# Patient Record
Sex: Male | Born: 1955
Health system: Southern US, Community
[De-identification: ages and names within clinical notes are randomized; demographics above are authoritative.]

## PROBLEM LIST (undated history)

## (undated) DIAGNOSIS — N200 Calculus of kidney: Secondary | ICD-10-CM

## (undated) DIAGNOSIS — G4726 Circadian rhythm sleep disorder, shift work type: Secondary | ICD-10-CM

## (undated) DIAGNOSIS — I10 Essential (primary) hypertension: Secondary | ICD-10-CM

## (undated) DIAGNOSIS — T4145XA Adverse effect of unspecified anesthetic, initial encounter: Secondary | ICD-10-CM

## (undated) DIAGNOSIS — Z8669 Personal history of other diseases of the nervous system and sense organs: Secondary | ICD-10-CM

## (undated) DIAGNOSIS — Z9889 Other specified postprocedural states: Secondary | ICD-10-CM

## (undated) DIAGNOSIS — K219 Gastro-esophageal reflux disease without esophagitis: Secondary | ICD-10-CM

## (undated) DIAGNOSIS — G4730 Sleep apnea, unspecified: Secondary | ICD-10-CM

## (undated) DIAGNOSIS — T7840XA Allergy, unspecified, initial encounter: Secondary | ICD-10-CM

## (undated) DIAGNOSIS — C801 Malignant (primary) neoplasm, unspecified: Secondary | ICD-10-CM

## (undated) DIAGNOSIS — Z973 Presence of spectacles and contact lenses: Secondary | ICD-10-CM

## (undated) DIAGNOSIS — Z87442 Personal history of urinary calculi: Secondary | ICD-10-CM

## (undated) DIAGNOSIS — M1990 Unspecified osteoarthritis, unspecified site: Secondary | ICD-10-CM

## (undated) DIAGNOSIS — E049 Nontoxic goiter, unspecified: Secondary | ICD-10-CM

## (undated) DIAGNOSIS — G2581 Restless legs syndrome: Secondary | ICD-10-CM

## (undated) DIAGNOSIS — Z9989 Dependence on other enabling machines and devices: Secondary | ICD-10-CM

## (undated) DIAGNOSIS — I469 Cardiac arrest, cause unspecified: Secondary | ICD-10-CM

## (undated) DIAGNOSIS — G4733 Obstructive sleep apnea (adult) (pediatric): Secondary | ICD-10-CM

## (undated) DIAGNOSIS — N182 Chronic kidney disease, stage 2 (mild): Secondary | ICD-10-CM

## (undated) DIAGNOSIS — T8859XA Other complications of anesthesia, initial encounter: Secondary | ICD-10-CM

## (undated) DIAGNOSIS — R112 Nausea with vomiting, unspecified: Secondary | ICD-10-CM

## (undated) DIAGNOSIS — G4701 Insomnia due to medical condition: Principal | ICD-10-CM

## (undated) HISTORY — PX: TONSILLECTOMY: SUR1361

## (undated) HISTORY — DX: Circadian rhythm sleep disorder, shift work type: G47.26

## (undated) HISTORY — DX: Obstructive sleep apnea (adult) (pediatric): G47.33

## (undated) HISTORY — PX: CHOLECYSTECTOMY: SHX55

## (undated) HISTORY — DX: Calculus of kidney: N20.0

## (undated) HISTORY — DX: Dependence on other enabling machines and devices: Z99.89

## (undated) HISTORY — PX: APPENDECTOMY: SHX54

## (undated) HISTORY — DX: Sleep apnea, unspecified: G47.30

## (undated) HISTORY — DX: Insomnia due to medical condition: G47.01

## (undated) HISTORY — DX: Cardiac arrest, cause unspecified: I46.9

## (undated) HISTORY — DX: Malignant (primary) neoplasm, unspecified: C80.1

## (undated) HISTORY — PX: CYSTOSCOPY: SHX5120

## (undated) HISTORY — DX: Allergy, unspecified, initial encounter: T78.40XA

## (undated) HISTORY — PX: TYMPANOSTOMY TUBE PLACEMENT: SHX32

## (undated) HISTORY — PX: ROTATOR CUFF REPAIR: SHX139

## (undated) HISTORY — PX: UPPER GI ENDOSCOPY: SHX6162

## (undated) HISTORY — PX: OTHER SURGICAL HISTORY: SHX169

## (undated) HISTORY — DX: Restless legs syndrome: G25.81

## (undated) HISTORY — PX: VARICOCELE EXCISION: SUR582

---

## 1977-03-02 HISTORY — PX: VASECTOMY: SHX75

## 1998-03-15 ENCOUNTER — Ambulatory Visit: Admission: RE | Admit: 1998-03-15 | Discharge: 1998-03-15 | Payer: Self-pay | Admitting: Emergency Medicine

## 1998-03-16 ENCOUNTER — Ambulatory Visit: Admission: RE | Admit: 1998-03-16 | Discharge: 1998-03-16 | Payer: Self-pay | Admitting: Emergency Medicine

## 2000-02-29 ENCOUNTER — Encounter: Payer: Self-pay | Admitting: Emergency Medicine

## 2000-02-29 ENCOUNTER — Emergency Department (HOSPITAL_COMMUNITY): Admission: EM | Admit: 2000-02-29 | Discharge: 2000-02-29 | Payer: Self-pay | Admitting: Emergency Medicine

## 2000-10-12 ENCOUNTER — Ambulatory Visit (HOSPITAL_BASED_OUTPATIENT_CLINIC_OR_DEPARTMENT_OTHER): Admission: RE | Admit: 2000-10-12 | Discharge: 2000-10-12 | Payer: Self-pay | Admitting: Internal Medicine

## 2001-03-02 HISTORY — PX: OTHER SURGICAL HISTORY: SHX169

## 2001-03-08 ENCOUNTER — Encounter: Admission: RE | Admit: 2001-03-08 | Discharge: 2001-03-08 | Payer: Self-pay | Admitting: Orthopedic Surgery

## 2001-03-08 ENCOUNTER — Encounter: Payer: Self-pay | Admitting: Orthopedic Surgery

## 2002-01-16 ENCOUNTER — Encounter: Payer: Self-pay | Admitting: Orthopedic Surgery

## 2002-01-18 ENCOUNTER — Ambulatory Visit (HOSPITAL_COMMUNITY): Admission: RE | Admit: 2002-01-18 | Discharge: 2002-01-19 | Payer: Self-pay | Admitting: Orthopedic Surgery

## 2003-01-01 ENCOUNTER — Ambulatory Visit (HOSPITAL_COMMUNITY): Admission: RE | Admit: 2003-01-01 | Discharge: 2003-01-02 | Payer: Self-pay | Admitting: Orthopedic Surgery

## 2003-03-03 HISTORY — PX: CARDIAC CATHETERIZATION: SHX172

## 2004-01-04 ENCOUNTER — Ambulatory Visit: Payer: Self-pay | Admitting: Gastroenterology

## 2004-01-09 ENCOUNTER — Ambulatory Visit: Payer: Self-pay | Admitting: Gastroenterology

## 2004-02-08 ENCOUNTER — Ambulatory Visit: Payer: Self-pay | Admitting: Gastroenterology

## 2004-03-07 ENCOUNTER — Observation Stay (HOSPITAL_COMMUNITY): Admission: EM | Admit: 2004-03-07 | Discharge: 2004-03-08 | Payer: Self-pay | Admitting: *Deleted

## 2004-07-01 ENCOUNTER — Ambulatory Visit (HOSPITAL_COMMUNITY): Admission: RE | Admit: 2004-07-01 | Discharge: 2004-07-02 | Payer: Self-pay | Admitting: Orthopedic Surgery

## 2006-03-02 DIAGNOSIS — C439 Malignant melanoma of skin, unspecified: Secondary | ICD-10-CM

## 2006-03-02 HISTORY — PX: OTHER SURGICAL HISTORY: SHX169

## 2006-03-02 HISTORY — DX: Malignant melanoma of skin, unspecified: C43.9

## 2009-01-07 ENCOUNTER — Ambulatory Visit (HOSPITAL_BASED_OUTPATIENT_CLINIC_OR_DEPARTMENT_OTHER): Admission: RE | Admit: 2009-01-07 | Discharge: 2009-01-07 | Payer: Self-pay | Admitting: Orthopedic Surgery

## 2010-06-04 LAB — BASIC METABOLIC PANEL
BUN: 20 mg/dL (ref 6–23)
CO2: 28 mEq/L (ref 19–32)
Calcium: 8.9 mg/dL (ref 8.4–10.5)
Chloride: 105 mEq/L (ref 96–112)
Creatinine, Ser: 1.13 mg/dL (ref 0.4–1.5)
GFR calc Af Amer: >60 mL/min (ref >60)
GFR calc non Af Amer: >60 mL/min (ref >60)
Glucose, Bld: 85 mg/dL (ref 70–99)
Potassium: 3.6 mEq/L (ref 3.5–5.1)
Sodium: 140 mEq/L (ref 135–145)

## 2010-06-04 LAB — POCT HEMOGLOBIN-HEMACUE: Hemoglobin: 15.6 g/dL (ref 13.0–17.0)

## 2010-07-18 NOTE — Cardiovascular Report (Signed)
NAMEMYER, BOHLMAN NO.:  [id number]   MEDICAL RECORD NO.:  [id number]          PATIENT TYPE:  INP   LOCATION:  1826                         FACILITY:  [facility]   PHYSICIAN:  Nicki Guadalajara, M.D.     DATE OF BIRTH:  03/14/1955   DATE OF PROCEDURE:  03/07/2004  DATE OF DISCHARGE:                              CARDIAC CATHETERIZATION   CARDIAC CATHETERIZATION:   INDICATIONS:  Mr. 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  He has a strong family history for coronary  artery disease, with his mother having had CABG vascularization surgery.  The patient remotely had undergone treadmill testing approximately 3 years  ago, which was normal.   Over the past 5 days, the patient has developed recurrent episodes of chest  pain.  Initially, this was described somewhat sharp, would not evolve into a  substernal chest pressure.  Over the past 5 days, he has had increasing  episodes of chest pain.  He was awakened from sleep early this morning with  chest pain.  He was having intermittent episodes of discomfort today, which  lead to his evaluation at urgent care.  He was seen by Dr. Andee Poles and  transferred to [facility].   In the emergency room, ECG did not show any acute change or any significant  ST-T changes. However, due to the persistence of the mild chest discomfort,  definitive catheterization was recommended.   DESCRIPTION OF PROCEDURE:  After premedication with Versed 2 mg  intravenously, the patient was prepped and draped in the usual sterile  fashion.  This right femoral artery was punctured anteriorly and a 5 French  sheath was inserted without difficulty. Diagnostic catheterization was done  with 5 French Judkins 4 left and right coronary catheters.  A pigtail  catheter was used for biplane cineangiography. Distal aortography was also  performed. Hemostasis was obtained by direct manual pressure.  The patient  tolerated the procedure well.   HEMODYNAMIC DATA:  Initial central pressure was 146/93 with a mean of 118.  On LV, AO pull-back, left ventricular pressure was 133/13, post-A wave 19,  and central aortic pressure was 133/86, mean 109.   ANGIOGRAPHIC DATA:  Left main coronary artery was angiographically normal,  bifurcating to an LAD and left circumflex system.   The LAD was angiographically normal and gave rise to a small first diagonal  vessel, a large second diagonal vessel which was bifurcating.  The LAD  extended and wrapped around the LV apex.  The LAD in these pictures were  normal.   The circumflex vessel was angiographically normal and gave rise to 2  marginal vessels.   The right coronary artery was an angiographically normal, dominant vessel  that gave rise to the PDA and posterolateral system.   RAO ventriculography revealed normal systolic function without focal  segmental wall motion abnormalities.   DISTAL AORTOGRAPHY:  This revealed normal aortoiliac system.  There was no  evidence for renal artery stenosis.   IMPRESSION:  1.  Normal left ventricular function.  2.  Normal  coronary arteries.  3.  Normal aortoiliac system.       TK/MEDQ  D:  03/07/2004  T:  03/07/2004  Job:  [id number]   cc:   Naomie Dean, M.D.   Vania Rea. Jarold Motto, M.D. [facility]

## 2010-07-18 NOTE — Op Note (Signed)
NAMEMarland Kitchen  Casey [name], Casey [name]                          ACCOUNT NO.:  [id number]   MEDICAL RECORD NO.:  [id number]                   PATIENT TYPE:  OIB   LOCATION:  2550                                 FACILITY:  [facility]   PHYSICIAN:  Elana Alm. Thurston Hole, M.D.              DATE OF BIRTH:  02/12/1956   DATE OF PROCEDURE:  01/18/2002  DATE OF DISCHARGE:                                 OPERATIVE REPORT   PREOPERATIVE DIAGNOSES:  1. Left shoulder rotator cuff tear with labrum tear.  2. Left shoulder impingement.  3. Left shoulder AC joint spurring and arthrosis.   POSTOPERATIVE DIAGNOSES:  1. Left shoulder partial rotator cuff tear.  2. Left shoulder partial glenoid labrum tear.  3. Left shoulder impingement.  4. Left shoulder acromioclavicular joint spurring with arthrosis.   PROCEDURE:  1. Left shoulder examination under anesthesia followed by arthroscopic     partial rotator cuff tear debridement.  2. Left shoulder partial labrum tear debridement.  3. Left shoulder subacromial decompression.  4. Left shoulder distal clavicle excision.   SURGEON:  Elana Alm. Thurston Hole, M.D.   ASSISTANT:  Marvene Staff, P.A.   ANESTHESIA:  General.   OPERATIVE TIME:  45 minutes.   COMPLICATIONS:  None.   INDICATIONS FOR PROCEDURE:  Casey [name] is a 55 year old gentleman who injured  his left shoulder at work approximately 6 weeks ago, when he was climbing  onto a firetruck, holding a bar, and his foot slipped causing him to fall,  injuring his left arm; persistent pain. X-rays and MRI have revealed a  possible rotator cuff tear with impingement, he has failed conservative care  and he is now to undergo arthroscopy.   DESCRIPTION OF PROCEDURE:  The patient was brought to the operating room on  01/18/02 after a block had been placed in the holding area.  The patient was  placed on the operating table in the supine position.  After an adequate  level of general anesthesia was obtained, his left shoulder was  examined  under anesthesia.  He had full range of motin and shoulder was stable to  ligamentous exam.  He was then placed in a beach chair position, and the  shoulder and arm was prepped using sterile Duraprep and drape using sterile  technique.   Originally through a posterior arthroscopic protal and the arthroscope with  a pump attached, this was placed into an anterior portal, and arthroscopic  probe was placed. On initial inspection, the articular cartilage and  glenohumeral joint was intact.  Anterior labrum showed partial tearing, 25-  30% which was debrided. Superior labrum and biceps tendon anchor was intact.  Biceps tendon was intact. Inferior labrum and anteroinferior glenohumeral  ligament complex was intact and the posterior labrum showed partial tearing,  25% which was debrided. Rotator cuff showed a partial tear of 40-50% of the  undersurface of the supraspinatus which was debrided, but a complete  tear  was not found. The rest of the rotator cuff was intact.  The inferior  capsular recess was free of pathology.   Subacromial space was entered and a lateral arthroscopic portal was made. A  large amount of bursitis was resected.  Underneath this, the rotator cuff  was inflamed and somewhat frayed, but no evidence of a tear.  Subacromial  decompression was carried out, removing 6-8 mm of the undersurface of the  anterior, anterolateral and anteromedial acromion and CA ligament release  carried out as well. The [facility] joint was exposed.  There was significant  spurring and degenerative changes in the distal clavicle and the distal 5-6  mm of the clavicle was resected.  After this was done, the shoulder could be  brought through a full range of motin with no impingement of the rotator  cuff.  At this point, it was felt that all pathology had been satisfactorily  addressed. The instruments were removed. The portals were closed with 3-0  nylon suture and sterile dressings and a sling  applied. The patient was  awakened, taken to the recovery room in stable condition.  Needle and sponge  counts correct x2 at the end of the care.   FOLLOW UP CARE:  The patient will be followed as an in-patient on telemetry  due to a history of sleep apnea.  He will be discharged tomorrow if stable.  Begin early physical therapy with passive range of motin only x4-5 weeks and  active thereafter. We will see him back in a week for sutures out and follow  up.                                                   [name], M.D.    RAW/MEDQ  D:  01/18/2002  T:  01/18/2002  Job:  [id number]   cc:   Workmans' Compensation carrier

## 2010-07-18 NOTE — Op Note (Signed)
Casey [name], MATHIESON NO.:  [id number]   MEDICAL RECORD NO.:  [id number]          PATIENT TYPE:  OIB   LOCATION:  5024                         FACILITY:  [facility]   PHYSICIAN:  Dionne Ano. Gramig III, M.D.DATE OF BIRTH:  12-Aug-1955   DATE OF PROCEDURE:  07/01/2004  DATE OF DISCHARGE:                                 OPERATIVE REPORT   PREOPERATIVE DIAGNOSES:  Left wrist injury with TFC tear and notable LT  ligament tear, rule out unstable LT joint.   POSTOPERATIVE DIAGNOSES:  Stable lunotriquetral/LT joint and noted TFC  central tear large in nature.   SURGICAL PROCEDURE PERFORMED:  1.  Evaluation under anesthesia, left wrist.  2.  Arthroscopic evaluation of the left wrist about the radial carpal and      mid-carpal joints with left TFC debridement of a central large tear and      lunotriquetral membranous tear debridement, which was non-destabilized.  3.  Synovectomy of radial carpal joint.   SURGEON:  Dionne Ano. Amanda Pea, MD.   ASSISTANT:  Karie Chimera, PA-C.   COMPLICATIONS:  None.   ANESTHESIA:  General.   ESTIMATED BLOOD LOSS:  Minimal.   INDICATIONS FOR PROCEDURE:  This patient is a very pleasant male who  presents with the above admission diagnoses.  I have counseled him with  regard to the risks and benefits of surgery.  With this in mind, I was asked  to proceed.  All questions have been encouraged and answered preoperatively.   OPERATIVE FINDINGS:  This patient had an unstable central TFC tear.  This  underwent debridement.  The periphery was intact and stable.  This patient  also underwent debridement of a membranous lunotriquetral tear, which was  non-destabilizing.  The LT joint, as we evaluated about the mid-carpal and  radial carpal joints, was stable and did not require pinning.   OPERATION IN DETAIL:  The patient was seen by myself and the Anesthesia team  in the operative suite and underwent smooth induction of general anesthesia.  Once  under anesthesia, he was _____ and prepped and draped in the usual  sterile fashion.  Following this, examination under anesthesia revealed a  stable wrist joint.  I then made a 3.4 portal.  The 3.4 portal was created  without difficulty.  A 6-U outflow portal on a 6-R working portal as well as  a 4.5 working portal was created.  Following this, the arthroscope was  introduced and sequential evaluation was accomplished.  The radial styloid,  scaphoid fossa, and lunate fossa were intact.  The scapholunate interosseous  ligament as well as scaphoid and lunate were intact.  The lunotriquetral  ligament had a membranous tear, but no gross destabilization on the radial  carpal region.  The patient had a large TFC tear, which was highly irregular  and in poor condition centrally.  The periphery was intact and was probed  thoroughly.  I then with combination shaver and thermal ablator performed  debridement of the central TFC tear without difficulty.  This was done to my  satisfaction and smoothed to a nice firm  base.  The patient had the  periphery kept intact and without iatrogenic injury. The patient had a  synovectomy performed as well.  The synovectomy was accomplished with a  shaver without complicating features.  Once the synovectomy was performed, I  then placed the scope in the 4.5 portal and through the 6-R portal and  debrided the LT membranous tear.  I did not see any gross instability at  this time.  Once this was done, I made a mid-carpal portal.  This was a mid-  carpal R-portal.  The arthroscope was introduced, and I stretched the head  of scapholunate and lunotriquetral joints under direct arthroscopic  evaluation and did not see a highly unstable LT joint.  Thus, the decision  was made not to pin the LT joint.  I feel this will heal.  The patient does  not have instability about the lunotriquetral joint that is excessive.  Once  this was done, I then performed a second look at the  [facility], all looked quite  well, and I was pleased with the debridement and synovectomy.  The  arthroscope was then removed.  The wrist was stable.  I then performed a  closure of the portals and placement of Marcaine without epinephrine.  Following this, the patient had a long-arm plaster splint applied without  difficulty.   He tolerated the procedure well, and there were no complicating features.  He will be admitted overnight due to the fact that he has sleep apnea and we  will want to watch him closely.  We have gone over these issues in detail  and all questions have been encouraged and answered.      WMG/MEDQ  D:  07/01/2004  T:  07/01/2004  Job:  [id number]

## 2010-07-18 NOTE — Discharge Summary (Signed)
Casey [name], Casey [name] NO.:  [id number]   MEDICAL RECORD NO.:  [id number]          PATIENT TYPE:  INP   LOCATION:  3704                         FACILITY:  [facility]   PHYSICIAN:  Nicki Guadalajara, M.D.     DATE OF BIRTH:  06/24/1955   DATE OF ADMISSION:  03/07/2004  DATE OF DISCHARGE:  03/08/2004                                 DISCHARGE SUMMARY   [name] is a 55 year old white married male, with a no history of CAD. He  came to the emergency room, after being seen at Urgent Care by Dr. Pollie Friar. He  was referred secondary to chest pain. 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 He was seen in the emergency room  by Dr. Jearld Pies and it was decided that he should cardiac catheterization.   He was taken to the laboratory. He had normal coronary arteries. The morning  of 1/706, he was feeling fine, his blood was okay, his laboratories were all  normal, his blood pressure 132/80, heart rate 85, temperature 97.5. His  laboratories were okay and it was decided to put him on Protonix for a month  and then he could use it on a p.r.n. basis.   DISPOSITION:  He was considered stable to be discharged to home.   DISCHARGE FOLLOW-UP:  He can follow up with primary care doctor.   DISCHARGE SPECIAL INSTRUCTIONS:  If he has any problems with his groin, he  will give our office a call.   DISCHARGE MEDICATIONS:  His usual medications. He takes trazodone 400 mg at  bedtime, Flexeril 40 mg at bedtime, Klonopin 4 mg at bedtime, Protonix 40  mg, one tablet per day x1 month and then p.r.n.   LABORATORY DATA:  His hemoglobin was 14.6, hematocrit 43.1, WBC 8.5,  platelets 261. Sodium 138, potassium 3.8, chloride 108, CO2 26, BUN 19,  creatinine 1.1. CK-MB was negative. Lipid profile showed a total cholesterol  of 236, triglycerides 111, HDL 36, LDL 178; this was a nonfasting specimen.  Diet therapy was recommended for now and he should follow up with his  primary care doctor. It may be that he will need Statin therapy for primary  prevention in the future.   DISCHARGE DIAGNOSIS:  1.  Chest pain, noncoronary ischemia with a normal cardiac catheterization,      normal left ventricular function.  2.  Possible musculoskeletal pain or gastritis, treated with Protonix x1      month.  3.  Hyperlipidemia, diet recommended at this time and follow up with his      primary care doctor.       BB/MEDQ  D:  03/08/2004  T:  03/08/2004  Job:  [id number]   cc:   Dr. Pollie Friar  Urgent Care

## 2010-07-18 NOTE — Op Note (Signed)
NAMEMarland [name]  COY, ROCHFORD                          ACCOUNT NO.:  [id number]   MEDICAL RECORD NO.:  [id number]                   PATIENT TYPE:  OIB   LOCATION:  2550                                 FACILITY:  [facility]   PHYSICIAN:  Elana Alm. Thurston Hole, M.D.              DATE OF BIRTH:  12/17/1955   DATE OF PROCEDURE:  01/01/2003  DATE OF DISCHARGE:                                 OPERATIVE REPORT   PREOPERATIVE DIAGNOSIS:  Left shoulder partial rotator cuff tear with  arthrofibrosis and impingement.   POSTOPERATIVE DIAGNOSIS:  Left shoulder partial rotator cuff tear with  arthrofibrosis and impingement.   OPERATION PERFORMED:  1. Left shoulder examination under anesthesia followed by arthroscopic     partial rotator cuff tear.  2. Lysis of adhesions with manipulation.  3. Left shoulder subacromial decompression with acromioclavicular joint     debridement.   SURGEON:  Elana Alm. Thurston Hole, M.D.   ASSISTANT:  Julien Girt, P.A.   ANESTHESIA:  General.   OPERATIVE TIME:  45 minutes.   COMPLICATIONS:  None.   INDICATIONS FOR PROCEDURE:  Casey [name] is a 55 year old gentleman who  initially injured his left shoulder a year ago.  Subsequently, he underwent  left shoulder arthroscopy with partial rotator cuff tear debridement and  subacromial decompression, distal clavicle excision 11 months ago.  He has  had persistent pain despite this surgery with lack of response to  postoperative routine care.  MRI done subsequently has revealed no definite  recurrent rotator cuff tear but with Casey [name] persistent pain and lack of  response to conservative care, we have recommended lysis of adhesions,  manipulation and repeat arthroscopy which he is in agreement with and will  proceed with this today.   DESCRIPTION OF PROCEDURE:  Casey [name] was brought to the operating room on  January 01, 2003 after a block had been placed in the holding room.  He was  placed on the operating table in supine  position.  After being placed under  general anesthesia, his left shoulder was examined under anesthesia.  He  actually had full range of motion of the shoulder and it was stable to  ligamentous exam.  After this was done, he was placed in a beach chair  position and his shoulder and arm was prepped using sterile DuraPrep and  draped using sterile technique.  Initially, the arthroscopy was performed  through a posterior arthroscopic portal.  The arthroscope with a pump  attached was placed and through an anterior portal, an arthroscopic probe  was placed.  On initial inspection, the articular cartilage in the  glenohumeral joint was intact.  The anterior labrum, superior labrum was  intact, the biceps tendon was intact.  The biceps tendon anchor was intact,  posterior labrum was intact.  Anterior inferior glenohumeral ligament  complex was intact.  Rotator cuff was thoroughly inspected and the area of  its previous  partial tear had healed.  There was some mild fraying of the  infraspinatus which was debrided but the remaining portions of the rotator  cuff were found to be intact.  Inferior capsular recess was free of  pathology. Subacromial space was entered and a lateral arthroscopic portal  was made.  There was thickened bursitis and partial tearing of the bursal  surface of the rotator cuff which was debrided and a subtotal bursectomy was  carried out as well. There was found to be calcifications on the anterior  acromion where he had had a previous subacromial decompression and these  were totally resected with a 6 mm bur.  The acromioclavicular joint area  where he had a previous distal clavicle excision was inspected.  There was  found to be fibrotic tissue in this area and this was thoroughly debrided  but the acromioclavicular joint area was completely wide open with  approximately 1 cm of open space with no need for a second disk revision,  distal clavicle excision.  After all of  this lysis of adhesions, bursectomy,  decompression and partial cuff tear had been debrided, no further pathology  was noted.  The arthroscopic instruments were removed.  The portals were  closed with 3-0 nylon suture and sterile dressings and a sling applied and  the patient awakened and taken to recovery room in stable condition.   FOLLOW UP:  Mr. Bendickson will be followed as an overnight patient for  observation.  He will be discharged in 24 hours.  He will begin early  physical therapy.  We will see him back in the office in a week for suture  removal and follow-up.                                               [name], M.D.    RAW/MEDQ  D:  01/01/2003  T:  01/01/2003  Job:  [id number]

## 2010-07-27 ENCOUNTER — Observation Stay (HOSPITAL_COMMUNITY)
Admission: EM | Admit: 2010-07-27 | Discharge: 2010-07-28 | Disposition: A | Discharge status: Home / Self Care | Payer: 59 | Attending: Urology | Admitting: Urology

## 2010-07-27 ENCOUNTER — Emergency Department (HOSPITAL_COMMUNITY): Payer: 59

## 2010-07-27 DIAGNOSIS — N201 Calculus of ureter: Principal | ICD-10-CM | POA: Insufficient documentation

## 2010-07-27 DIAGNOSIS — Z79899 Other long term (current) drug therapy: Secondary | ICD-10-CM | POA: Insufficient documentation

## 2010-07-27 DIAGNOSIS — G4733 Obstructive sleep apnea (adult) (pediatric): Secondary | ICD-10-CM | POA: Insufficient documentation

## 2010-07-27 DIAGNOSIS — R109 Unspecified abdominal pain: Secondary | ICD-10-CM | POA: Insufficient documentation

## 2010-07-27 DIAGNOSIS — I10 Essential (primary) hypertension: Secondary | ICD-10-CM | POA: Insufficient documentation

## 2010-07-27 DIAGNOSIS — M1990 Unspecified osteoarthritis, unspecified site: Secondary | ICD-10-CM | POA: Insufficient documentation

## 2010-07-27 DIAGNOSIS — K219 Gastro-esophageal reflux disease without esophagitis: Secondary | ICD-10-CM | POA: Insufficient documentation

## 2010-07-27 LAB — DIFFERENTIAL
Basophils Absolute: 0.1 10*3/uL (ref 0.0–0.1)
Eosinophils Absolute: 0.3 10*3/uL (ref 0.0–0.7)
Lymphocytes Relative: 32 % (ref 12–46)
Lymphs Abs: 2.7 10*3/uL (ref 0.7–4.0)
Neutrophils Relative %: 55 % (ref 43–77)

## 2010-07-27 LAB — URINALYSIS, ROUTINE W REFLEX MICROSCOPIC
Bilirubin Urine: NEGATIVE
Glucose, UA: NEGATIVE mg/dL
Ketones, ur: NEGATIVE mg/dL
Leukocytes, UA: NEGATIVE
Specific Gravity, Urine: 1.019 (ref 1.005–1.030)
pH: 5.5 (ref 5.0–8.0)

## 2010-07-27 LAB — BASIC METABOLIC PANEL
BUN: 18 mg/dL (ref 6–23)
Chloride: 103 mEq/L (ref 96–112)
GFR calc non Af Amer: >60 mL/min (ref >60)
Potassium: 3.8 mEq/L (ref 3.5–5.1)
Sodium: 139 mEq/L (ref 135–145)

## 2010-07-27 LAB — CBC
MCV: 88.9 fL (ref 78.0–100.0)
Platelets: 239 10*3/uL (ref 150–400)
RBC: 4.79 MIL/uL (ref 4.22–5.81)
RDW: 12.4 % (ref 11.5–15.5)
WBC: 8.5 10*3/uL (ref 4.0–10.5)

## 2010-07-27 LAB — URINE MICROSCOPIC-ADD ON

## 2010-07-28 LAB — URINE CULTURE
Colony Count: NO GROWTH
Culture  Setup Time: 201205280020
Culture: NO GROWTH
Special Requests: POSITIVE

## 2010-07-28 NOTE — Op Note (Signed)
  [name], Casey                ACCOUNT NO.:  [id number]  MEDICAL RECORD NO.:  [id number]           PATIENT TYPE:  I  LOCATION:  1443                         FACILITY:  [facility]  PHYSICIAN:  Martina Sinner, MD DATE OF BIRTH:  23-Jun-1955  DATE OF PROCEDURE:  07/27/2010 DATE OF DISCHARGE:                              OPERATIVE REPORT   PREOPERATIVE DIAGNOSIS:  Left ureteral stone with left flank pain.  POSTOPERATIVE DIAGNOSIS:  Left ureteral stone with left flank pain.  SURGERY: 1. Cystoscopy. 2. Left retrograde urethrogram. 3. Insertion of left ureteral stent; fulguration of small mucosal     bleeder.  Mr. Casey [name] is followed by Dr. Brunilda Payor.  He has passed approximately 20 stones over his lifetime.  By history, he did not tolerate the stent many years ago.  The patient had ongoing colic and consented to the above procedure.  He is known to have 2 upper ureteral stone, largest 6 mm.  A recent KUB today was not definitive on the diagnosis and he had a CT scan on Jul 11, 2010.  The patient was prepped and draped in usual fashion.  A 21-French scope was utilized.  Penile bulbar membranous urethra normal.  He had mild bilobar enlargement of the prostate.  He has little bit of a high-riding bladder neck.  When I cystoscoped the patient, he had approximately 6 mm stone that was just starting to crown at the left ureteral orifice.  I gently tried to remove the stone with grasping forceps, but it would not come through the meatus.  At this point, I decided to place a sensor wire.  On passage of the sensor wire easily up in the left renal pelvis, the stone migrated proximally.  Over the sensor wire, I passed a 6-French open-end ureteral catheter to the mid ureter.  Retrograde urethrogram:  I injected approximately 4 to 5 cc gently of contrast and he had a nondilated left ureter and left collecting system. I did not see any stone filling defects.  I then replaced the  sensor wire curling in the upper pole calix.  Over the wire and under fluoroscopic and cystoscopic guidance, I passed a 26- cm x 6-mm double-J stent curling in the bladder and the upper pole calix.  There was some dark colored urine expelling from the ureteral orifice at the middle wall of the mouth.  There was a little bit of sand as well.  It was not sent for stone analysis.  From trying to grasp the stone, there was one small mucosal bleeder at 5 o'clock away from the ureteral orifice.  This was easily fulgurated with a light energy.  There was no bleeding at the end of case.  Bladder was emptied at the end of the case.  The patient will be taken to recovery room.          ______________________________ Martina Sinner, MD     SAM/MEDQ  D:  07/27/2010  T:  07/28/2010  Job:  [id number]  Electronically Signed by Alfredo Martinez MD on 07/28/2010 06:15:17 PM

## 2010-08-04 ENCOUNTER — Observation Stay (HOSPITAL_COMMUNITY)
Admission: RE | Admit: 2010-08-04 | Discharge: 2010-08-05 | Disposition: A | Discharge status: Home / Self Care | Payer: 59 | Source: Ambulatory Visit | Attending: Urology | Admitting: Urology

## 2010-08-04 DIAGNOSIS — Z86718 Personal history of other venous thrombosis and embolism: Secondary | ICD-10-CM | POA: Insufficient documentation

## 2010-08-04 DIAGNOSIS — N201 Calculus of ureter: Principal | ICD-10-CM | POA: Insufficient documentation

## 2010-08-04 DIAGNOSIS — I10 Essential (primary) hypertension: Secondary | ICD-10-CM | POA: Insufficient documentation

## 2010-08-04 DIAGNOSIS — Z7982 Long term (current) use of aspirin: Secondary | ICD-10-CM | POA: Insufficient documentation

## 2010-08-04 DIAGNOSIS — Z79899 Other long term (current) drug therapy: Secondary | ICD-10-CM | POA: Insufficient documentation

## 2010-08-04 DIAGNOSIS — G4730 Sleep apnea, unspecified: Secondary | ICD-10-CM | POA: Insufficient documentation

## 2010-08-04 DIAGNOSIS — R12 Heartburn: Secondary | ICD-10-CM | POA: Insufficient documentation

## 2010-08-11 NOTE — Op Note (Signed)
NAMERANDELL, TEARE NO.:  [id number]  MEDICAL RECORD NO.:  [id number]  LOCATION:  1534                         FACILITY:  [facility]  PHYSICIAN:  Excell Seltzer. Annabell Howells, M.D.    DATE OF BIRTH:  January 01, 1956  DATE OF PROCEDURE:  08/04/2010 DATE OF DISCHARGE:                              OPERATIVE REPORT   PROCEDURE:  Cystoscopy, removal of left double-J stent and left ureteroscopic stone extraction.  PREOPERATIVE DIAGNOSIS:  Left distal stones.  POSTOPERATIVE DIAGNOSIS:  Left distal stones.  SURGEON:  Excell Seltzer. Annabell Howells, MD  ANESTHESIA:  General.  SPECIMEN:  Distal ureteral stones and stent which was discarded.  COMPLICATIONS:  None.  INDICATIONS:  Mr. Jim is a 55 year old white male who underwent stenting for left distal stones approximately 8 days ago by Dr. Sherron Monday.  He was unable to retrieve the stones and the patient returned today with severe stent discomfort and was elected to proceed with stent removal and ureteroscopy this evening.  FINDINGS OF THE PROCEDURE:  He was given Cipro.  He was taken operating room where general anesthetic was induced.  He was placed in lithotomy position.  His perineum and genitalia were prepped Betadine solution and he was draped in usual sterile fashion.  A time-out was performed. Cystoscopy was performed using the 22-French scope and 12-degree lens. Examination revealed a normal urethra.  The prostatic urethra was approximately 3 cm length with bilobar hyperplasia and a small middle lobe.  Examination of bladder revealed mild trabeculation.  No tumors or stones were noted.  The right ureteral orifice was unremarkable.  The left ureteral orifice was edematous, but widely patent with a stent exiting the meatus.  The stent was grasped with a grasping forceps and pulled to the urethral meatus.  The wire was passed to the kidney and the stent was removed and discarded.  A 6-French short ureteroscope was then inserted alongside  the wire.  The initial stone was identified immediately and was grasped with a Nitinol basket and removed without difficulty through the dilated ureter. Second inspection initially did not notice a stone and I advanced the scope all the way to the kidney, but on retrieval there was a small fragment, it was noted that seemed to flush away.  At this point, the ureteroscope was removed.  Cystoscope was reinserted and the bladder was inspected.  No stone fragments were found.  I elected to reinsert the ureteroscope to make one final look and actually found second stone larger than the first that measured approximately 6 mm. This stone was grasped with a Nitinol basket and also removed without difficulty through the dilated ureter.  At this point, a final inspection with ureteroscope was performed with no residual stones found, no significant ureteral trauma, so it was felt a stent was not needed for reinsertion.  At this point, the cystoscope was reinserted over the wire.  The wire was removed.  The bladder was drained.  The drapes were removed.  The patient was taken down from lithotomy position and his anesthetic was reversed.  He was moved to the recovery room in stable condition.  He was given Toradol at the end of the case.  Excell Seltzer. Annabell Howells, M.D.     JJW/MEDQ  D:  08/04/2010  T:  08/05/2010  Job:  [id number]  cc:   Martina Sinner, MD Fax: [phone]  Electronically Signed by Bjorn Pippin M.D. on 08/11/2010 07:41:59 AM

## 2010-08-31 NOTE — Discharge Summary (Signed)
  NAMEJERRAL, MCCAULEY NO.:  [id number]  MEDICAL RECORD NO.:  [id number]  LOCATION:  1534                         FACILITY:  [facility]  PHYSICIAN:  Excell Seltzer. Annabell Howells, M.D.    DATE OF BIRTH:  May 07, 1955  DATE OF ADMISSION:  08/04/2010 DATE OF DISCHARGE:  08/05/2010                              DISCHARGE SUMMARY   Briefly, Mr. Tippen is a 55 year old white male who has a left ureteral stone and underwent stent placement 8 days prior to admission.  He is not tolerating the stent well and so come in today for removal of the stent and stone.  Past history is pertinent for sleep apnea, heartburn, hypertension and history of DVTs  SURGICAL HISTORY:  Pertinent for foot surgery on both feet, left knee surgery, left shoulder surgery, left wrist surgery and the cysto as noted above.  ADMISSION MEDICATIONS:  Include Aciphex, aspirin, cefatrizine, ciprofloxacin, clonazepam, cyclobenzaprine, glucosamine, hydromorphone, lisinopril, multivitamin, omega 3, promethazine, Pyridium, tamsulosin, tramadol, trazodone, vitamin C and vitamin D  ALLERGIES:  Include VICODIN AND CODEINE DERIVATIVES.  For additional details of the history, see the office H and P.  Accessory clinical information, PCR was negative.  HOSPITAL COURSE:  On the day of admission he was taken to operating room where he underwent cystoscopy with removal of left ureteral stent and removal of two 6-mm left distal ureteral stent.  The stent was not reinserted.  Postoperatively he did well but since the procedure was done so late at night he was kept until the following morning at which point he was discharged home with resumption of his current medications and instructions to follow up in 1 to 2 weeks for followup visit.  He is to bring the stone to the office.  DISCHARGE DIAGNOSIS:  Left distal ureteral stone and severe stent pain. There were no complications during admission.     Excell Seltzer. Annabell Howells,  M.D.     JJW/MEDQ  D:  08/11/2010  T:  08/11/2010  Job:  [id number]  Electronically Signed by Bjorn Pippin M.D. on 08/31/2010 12:08:10 PM

## 2010-09-02 NOTE — Discharge Summary (Signed)
  NAMEKEYLAN, Casey [name] NO.:  [id number]  MEDICAL RECORD NO.:  [id number]  LOCATION:  1443                         FACILITY:  [facility]  PHYSICIAN:  Martina Sinner, MD DATE OF BIRTH:  [birth year]  DATE OF ADMISSION:  07/27/2010 DATE OF DISCHARGE:  07/28/2010                              DISCHARGE SUMMARY   PREOPERATIVE DIAGNOSIS:  Left ureteral stones.  POSTOPERATIVE DIAGNOSIS:  Left ureteral stones.  SURGERY:  Cystoscopy, left retrograde urethrogram, insertion of left ureteral stent.  Mr. [name] had left flank pain.  He consented to the above procedure.  He underwent a stent.  He did well postoperatively.  He had no stent pain postoperatively.  He was little bit constipated.  He was discharged home with normal vitals, normal labs, good mobility and having no pain.  Mr. Casey [name] with follow up with Dr. Ezzie Dural.          ______________________________ Martina Sinner, MD     SAM/MEDQ  D:  08/19/2010  T:  08/19/2010  Job:  [id number]  Electronically Signed by Alfredo Martinez MD on 09/02/2010 12:28:27 PM

## 2011-04-04 ENCOUNTER — Other Ambulatory Visit: Payer: Self-pay | Admitting: Emergency Medicine

## 2011-04-05 ENCOUNTER — Other Ambulatory Visit: Payer: Self-pay | Admitting: Emergency Medicine

## 2011-04-07 ENCOUNTER — Other Ambulatory Visit: Payer: Self-pay | Admitting: Emergency Medicine

## 2011-04-20 ENCOUNTER — Other Ambulatory Visit: Payer: Self-pay | Admitting: Family Medicine

## 2011-04-20 MED ORDER — CYCLOBENZAPRINE HCL 10 MG PO TABS: ORAL_TABLET | ORAL | Status: DC

## 2011-04-20 MED ORDER — TRAZODONE HCL 100 MG PO TABS: 100.0000 mg | ORAL_TABLET | Freq: Every day | ORAL | Status: DC

## 2011-04-26 ENCOUNTER — Ambulatory Visit (INDEPENDENT_AMBULATORY_CARE_PROVIDER_SITE_OTHER): Payer: 59 | Admitting: Emergency Medicine

## 2011-04-26 ENCOUNTER — Telehealth: Payer: Self-pay | Admitting: Family Medicine

## 2011-04-26 DIAGNOSIS — G4700 Insomnia, unspecified: Secondary | ICD-10-CM

## 2011-04-26 DIAGNOSIS — E669 Obesity, unspecified: Secondary | ICD-10-CM

## 2011-04-26 DIAGNOSIS — R1084 Generalized abdominal pain: Secondary | ICD-10-CM

## 2011-04-26 DIAGNOSIS — M549 Dorsalgia, unspecified: Secondary | ICD-10-CM

## 2011-04-26 DIAGNOSIS — F5104 Psychophysiologic insomnia: Secondary | ICD-10-CM | POA: Insufficient documentation

## 2011-04-26 DIAGNOSIS — M159 Polyosteoarthritis, unspecified: Secondary | ICD-10-CM | POA: Insufficient documentation

## 2011-04-26 DIAGNOSIS — F411 Generalized anxiety disorder: Secondary | ICD-10-CM

## 2011-04-26 MED ORDER — TRAZODONE HCL 100 MG PO TABS: 100.0000 mg | ORAL_TABLET | Freq: Two times a day (BID) | ORAL | Status: DC

## 2011-04-26 MED ORDER — RABEPRAZOLE SODIUM 20 MG PO TBEC: 20.0000 mg | DELAYED_RELEASE_TABLET | Freq: Every day | ORAL | Status: DC

## 2011-04-26 MED ORDER — CYCLOBENZAPRINE HCL 10 MG PO TABS: ORAL_TABLET | ORAL | Status: DC

## 2011-04-26 MED ORDER — TRAMADOL HCL 50 MG PO TABS
50.0000 mg | ORAL_TABLET | Freq: Two times a day (BID) | ORAL | Status: DC
Start: 2011-04-26 — End: 2011-12-30

## 2011-04-26 MED ORDER — CLONAZEPAM 2 MG PO TABS: 2.0000 mg | ORAL_TABLET | Freq: Two times a day (BID) | ORAL | Status: DC | PRN

## 2011-04-26 NOTE — Telephone Encounter (Signed)
Called in rx for clonazapam.

## 2011-04-26 NOTE — Progress Notes (Signed)
  Subjective:    Patient ID: Casey Bondar., male    DOB: Mar 22, 1955, 56 y.o.   MRN: [id number]  HPI patient comes in for prescription refills. 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    Review of Systems no change in the status of his chronic back pain and difficulty with insomnia.     Objective:   Physical Exam HEENT exam is within normal limits neck is supple chest clear.       Assessment & Plan:  Assessment as chronic low back pain second problem is chronic insomnia.

## 2011-05-19 ENCOUNTER — Other Ambulatory Visit: Payer: Self-pay | Admitting: Emergency Medicine

## 2011-05-23 ENCOUNTER — Telehealth: Payer: Self-pay

## 2011-05-23 NOTE — Telephone Encounter (Signed)
Pt states that he was taken off of Lisinopril by Dr.Daub but know he would like to begin taking the lisinopril again if possible. Pt would like a three month supply called into [facility]

## 2011-05-24 MED ORDER — LISINOPRIL-HYDROCHLOROTHIAZIDE 10-12.5 MG PO TABS: 1.0000 | ORAL_TABLET | Freq: Every day | ORAL | Status: DC

## 2011-05-24 NOTE — Telephone Encounter (Signed)
I put him on Lisinopril HCTZ 10/12.5 but I only gave him 30d supply because we should check him again to make sure it is working and his symptoms have resolved.

## 2011-05-24 NOTE — Telephone Encounter (Signed)
Why does pt want to go back on meds?  What is his BP running?

## 2011-05-24 NOTE — Telephone Encounter (Signed)
Pt.notified

## 2011-05-24 NOTE — Telephone Encounter (Signed)
Pt stated that he started having headaches so he checked his BP and it has been running top number between 140-145 and bottom between 90-92.  He wants 90 days for 1 yr if possible.

## 2011-10-04 ENCOUNTER — Other Ambulatory Visit: Payer: Self-pay | Admitting: Physician Assistant

## 2011-10-04 ENCOUNTER — Other Ambulatory Visit: Payer: Self-pay | Admitting: Internal Medicine

## 2011-10-05 ENCOUNTER — Other Ambulatory Visit: Payer: Self-pay | Admitting: *Deleted

## 2011-12-10 ENCOUNTER — Telehealth: Payer: Self-pay

## 2011-12-10 NOTE — Telephone Encounter (Signed)
pts out patient pharmacy calling to get clarification on flexeril  Rx,directions say 4 at bedtime????   Best phone [phone]

## 2011-12-10 NOTE — Telephone Encounter (Signed)
I have spoken to patient and he does take 4 at bedtime. Please advise, patient states he has taken 4 at bedtime for many years.

## 2011-12-10 NOTE — Telephone Encounter (Signed)
Please advise on sig for Flexeril

## 2011-12-11 NOTE — Telephone Encounter (Signed)
Called pharmacy to advise this is how he takes the medication.

## 2011-12-11 NOTE — Telephone Encounter (Signed)
Yes, it looks like that is correct. He takes 4 tablets at bedtime.

## 2011-12-19 ENCOUNTER — Other Ambulatory Visit: Payer: Self-pay | Admitting: Physician Assistant

## 2011-12-22 ENCOUNTER — Other Ambulatory Visit: Payer: Self-pay | Admitting: Physician Assistant

## 2011-12-28 ENCOUNTER — Encounter: Payer: Self-pay | Admitting: Radiology

## 2011-12-29 ENCOUNTER — Telehealth: Payer: Self-pay

## 2011-12-29 MED ORDER — CLONAZEPAM 2 MG PO TABS: 4.0000 mg | ORAL_TABLET | Freq: Every evening | ORAL | Status: DC | PRN

## 2011-12-29 NOTE — Telephone Encounter (Signed)
Patient would like refill of KLONOPIN.  Best#[phone]  [id number] - [place], [place] - [address]

## 2011-12-29 NOTE — Telephone Encounter (Signed)
Notified pt that Rx was faxed to pharmacy w/confirmation but that it is only for 14 days since he is overdue for OV. Pt agreed and stated he will actually try to come in tomorrow morning to see Dr Cleta Alberts.

## 2011-12-29 NOTE — Telephone Encounter (Signed)
Advise on renewal, med not pended, last Rx states needs OV, ? Now lower quantity vs denial. Pleas advise. Last fvisit was Feb.

## 2011-12-29 NOTE — Telephone Encounter (Signed)
Prescription for #30 pills signed at desk, pt needs OV for more

## 2011-12-30 ENCOUNTER — Ambulatory Visit (INDEPENDENT_AMBULATORY_CARE_PROVIDER_SITE_OTHER): Payer: 59 | Admitting: Emergency Medicine

## 2011-12-30 VITALS — BP 115/78 | HR 82 | Temp 98.0°F | Resp 16 | Ht 66.0 in | Wt 207.0 lb

## 2011-12-30 DIAGNOSIS — I10 Essential (primary) hypertension: Secondary | ICD-10-CM

## 2011-12-30 DIAGNOSIS — G4700 Insomnia, unspecified: Secondary | ICD-10-CM

## 2011-12-30 LAB — COMPREHENSIVE METABOLIC PANEL
ALT: 26 U/L (ref 0–53)
AST: 20 U/L (ref 0–37)
Albumin: 4.3 g/dL (ref 3.5–5.2)
Alkaline Phosphatase: 63 U/L (ref 39–117)
BUN: 15 mg/dL (ref 6–23)
Calcium: 9.4 mg/dL (ref 8.4–10.5)
Chloride: 103 mEq/L (ref 96–112)
Potassium: 4.6 mEq/L (ref 3.5–5.3)

## 2011-12-30 LAB — POCT CBC
Granulocyte percent: 56.7 %G (ref 37–80)
MID (cbc): 0.5 (ref 0–0.9)
MPV: 7.6 fL (ref 0–99.8)
POC Granulocyte: 3.5 (ref 2–6.9)
POC LYMPH PERCENT: 36 %L (ref 10–50)
POC MID %: 7.3 %M (ref 0–12)
Platelet Count, POC: 264 10*3/uL (ref 142–424)
RBC: 4.7 M/uL (ref 4.69–6.13)
RDW, POC: 13.7 %

## 2011-12-30 MED ORDER — TRAZODONE HCL 100 MG PO TABS: ORAL_TABLET | ORAL | Status: DC

## 2011-12-30 MED ORDER — TRAMADOL HCL 50 MG PO TABS: 50.0000 mg | ORAL_TABLET | Freq: Two times a day (BID) | ORAL | Status: DC

## 2011-12-30 MED ORDER — CYCLOBENZAPRINE HCL 10 MG PO TABS: ORAL_TABLET | ORAL | Status: DC

## 2011-12-30 MED ORDER — LISINOPRIL-HYDROCHLOROTHIAZIDE 10-12.5 MG PO TABS: 1.0000 | ORAL_TABLET | Freq: Every day | ORAL | Status: DC

## 2011-12-30 MED ORDER — RABEPRAZOLE SODIUM 20 MG PO TBEC: 20.0000 mg | DELAYED_RELEASE_TABLET | Freq: Every day | ORAL | Status: DC

## 2011-12-30 MED ORDER — CLONAZEPAM 2 MG PO TABS: 4.0000 mg | ORAL_TABLET | Freq: Every evening | ORAL | Status: DC | PRN

## 2011-12-30 NOTE — Progress Notes (Deleted)
  Subjective:    Patient ID: Casey Legault., male    DOB: Dec 13, 1955, 56 y.o.   MRN: [id number]  HPI    Review of Systems     Objective:   Physical Exam        Assessment & Plan:

## 2011-12-30 NOTE — Progress Notes (Signed)
  Subjective:    Patient ID: Casey [name]., male    DOB: 04-16-1955, 56 y.o.   MRN: [id number]  HPI patient enters to get prescriptions refilled prior to changing job. He denies any change in his situation. He continues on pain medication twice a day as well as his blood pressure medicines and sleep medications. These have been the same for number of years and has not changed at all.    Review of Systems     Objective:   Physical Exam HEENT exam is unremarkable. His neck is supple chest is clear to auscultation and percussion heart is regular rate without murmurs rubs or gallops.        Assessment & Plan:  Patient is stable at present on all current medications. These were refilled. We'll recheck in 6 months

## 2012-04-07 ENCOUNTER — Encounter (HOSPITAL_COMMUNITY): Payer: Self-pay | Admitting: *Deleted

## 2012-04-07 ENCOUNTER — Ambulatory Visit (HOSPITAL_COMMUNITY)
Admission: RE | Admit: 2012-04-07 | Discharge: 2012-04-07 | Disposition: A | Discharge status: Home / Self Care | Payer: 59 | Source: Ambulatory Visit | Attending: Urology | Admitting: Urology

## 2012-04-07 ENCOUNTER — Ambulatory Visit (HOSPITAL_COMMUNITY): Payer: 59 | Admitting: *Deleted

## 2012-04-07 ENCOUNTER — Other Ambulatory Visit: Payer: Self-pay | Admitting: Urology

## 2012-04-07 ENCOUNTER — Encounter (HOSPITAL_COMMUNITY)
Admission: RE | Disposition: A | Discharge status: Home / Self Care | Payer: Self-pay | Source: Ambulatory Visit | Attending: Urology

## 2012-04-07 ENCOUNTER — Encounter (HOSPITAL_COMMUNITY): Payer: Self-pay | Admitting: Pharmacy Technician

## 2012-04-07 DIAGNOSIS — I10 Essential (primary) hypertension: Secondary | ICD-10-CM | POA: Insufficient documentation

## 2012-04-07 DIAGNOSIS — K219 Gastro-esophageal reflux disease without esophagitis: Secondary | ICD-10-CM | POA: Insufficient documentation

## 2012-04-07 DIAGNOSIS — G4730 Sleep apnea, unspecified: Secondary | ICD-10-CM | POA: Insufficient documentation

## 2012-04-07 DIAGNOSIS — N201 Calculus of ureter: Secondary | ICD-10-CM | POA: Insufficient documentation

## 2012-04-07 DIAGNOSIS — Z79899 Other long term (current) drug therapy: Secondary | ICD-10-CM | POA: Insufficient documentation

## 2012-04-07 DIAGNOSIS — N400 Enlarged prostate without lower urinary tract symptoms: Secondary | ICD-10-CM | POA: Insufficient documentation

## 2012-04-07 DIAGNOSIS — Z7982 Long term (current) use of aspirin: Secondary | ICD-10-CM | POA: Insufficient documentation

## 2012-04-07 DIAGNOSIS — N200 Calculus of kidney: Secondary | ICD-10-CM | POA: Insufficient documentation

## 2012-04-07 HISTORY — DX: Gastro-esophageal reflux disease without esophagitis: K21.9

## 2012-04-07 HISTORY — DX: Unspecified osteoarthritis, unspecified site: M19.90

## 2012-04-07 HISTORY — DX: Other specified postprocedural states: Z98.890

## 2012-04-07 HISTORY — DX: Essential (primary) hypertension: I10

## 2012-04-07 HISTORY — PX: CYSTOSCOPY/RETROGRADE/URETEROSCOPY/STONE EXTRACTION WITH BASKET: SHX5317

## 2012-04-07 HISTORY — DX: Other complications of anesthesia, initial encounter: T88.59XA

## 2012-04-07 HISTORY — DX: Adverse effect of unspecified anesthetic, initial encounter: T41.45XA

## 2012-04-07 HISTORY — DX: Nausea with vomiting, unspecified: R11.2

## 2012-04-07 LAB — BASIC METABOLIC PANEL
CO2: 29 mEq/L (ref 19–32)
Chloride: 102 mEq/L (ref 96–112)
GFR calc Af Amer: 77 mL/min — ABNORMAL LOW (ref >90)
Sodium: 140 mEq/L (ref 135–145)

## 2012-04-07 LAB — CBC
HCT: 41.1 % (ref 39.0–52.0)
MCV: 88.6 fL (ref 78.0–100.0)
Platelets: 208 10*3/uL (ref 150–400)
RBC: 4.64 MIL/uL (ref 4.22–5.81)
RDW: 12.6 % (ref 11.5–15.5)
WBC: 9.3 10*3/uL (ref 4.0–10.5)

## 2012-04-07 LAB — SURGICAL PCR SCREEN: Staphylococcus aureus: NEGATIVE

## 2012-04-07 SURGERY — CYSTOSCOPY, WITH CALCULUS REMOVAL USING BASKET
Anesthesia: General | Site: Urethra | Laterality: Bilateral | Wound class: Clean Contaminated

## 2012-04-07 MED ORDER — LACTATED RINGERS IV SOLN
INTRAVENOUS | Status: DC
  Administered 2012-04-07 (×2): via INTRAVENOUS

## 2012-04-07 MED ORDER — TAMSULOSIN HCL 0.4 MG PO CAPS: 0.4000 mg | ORAL_CAPSULE | Freq: Every day | ORAL | Status: DC

## 2012-04-07 MED ORDER — SODIUM CHLORIDE 0.9 % IR SOLN
Status: DC | PRN
  Administered 2012-04-07: 3000 mL

## 2012-04-07 MED ORDER — LIDOCAINE HCL 2 % EX GEL
CUTANEOUS | Status: DC | PRN
  Administered 2012-04-07: 1 via URETHRAL

## 2012-04-07 MED ORDER — ACETAMINOPHEN 10 MG/ML IV SOLN
INTRAVENOUS | Status: DC | PRN
  Administered 2012-04-07: 1000 mg via INTRAVENOUS

## 2012-04-07 MED ORDER — MUPIROCIN 2 % EX OINT
TOPICAL_OINTMENT | CUTANEOUS | Status: AC
  Administered 2012-04-07: 1
  Filled 2012-04-07: qty 22

## 2012-04-07 MED ORDER — BELLADONNA ALKALOIDS-OPIUM 16.2-60 MG RE SUPP
RECTAL | Status: DC | PRN
  Administered 2012-04-07: 1 via RECTAL

## 2012-04-07 MED ORDER — DIATRIZOATE MEGLUMINE 30 % UR SOLN
URETHRAL | Status: DC | PRN
  Administered 2012-04-07: 300 mL via URETHRAL

## 2012-04-07 MED ORDER — MIDAZOLAM HCL 5 MG/5ML IJ SOLN
INTRAMUSCULAR | Status: DC | PRN
  Administered 2012-04-07: 2 mg via INTRAVENOUS

## 2012-04-07 MED ORDER — HYDROMORPHONE HCL 2 MG PO TABS
2.0000 mg | ORAL_TABLET | Freq: Once | ORAL | Status: AC
  Administered 2012-04-07: 2 mg via ORAL
  Filled 2012-04-07: qty 1

## 2012-04-07 MED ORDER — ONDANSETRON HCL 4 MG/2ML IJ SOLN: 4.0000 mg | Freq: Four times a day (QID) | INTRAMUSCULAR | Status: DC | PRN

## 2012-04-07 MED ORDER — DEXAMETHASONE SODIUM PHOSPHATE 10 MG/ML IJ SOLN
INTRAMUSCULAR | Status: DC | PRN
  Administered 2012-04-07: 10 mg via INTRAVENOUS

## 2012-04-07 MED ORDER — OXYCODONE HCL 5 MG PO TABS: 5.0000 mg | ORAL_TABLET | ORAL | Status: DC | PRN

## 2012-04-07 MED ORDER — HYDROMORPHONE HCL 2 MG PO TABS: 2.0000 mg | ORAL_TABLET | Freq: Four times a day (QID) | ORAL | Status: DC | PRN

## 2012-04-07 MED ORDER — FENTANYL CITRATE 0.05 MG/ML IJ SOLN
INTRAMUSCULAR | Status: DC | PRN
  Administered 2012-04-07 (×6): 25 ug via INTRAVENOUS
  Administered 2012-04-07: 100 ug via INTRAVENOUS

## 2012-04-07 MED ORDER — ONDANSETRON HCL 4 MG/2ML IJ SOLN
INTRAMUSCULAR | Status: DC | PRN
  Administered 2012-04-07: 4 mg via INTRAVENOUS

## 2012-04-07 MED ORDER — FENTANYL CITRATE 0.05 MG/ML IJ SOLN: 25.0000 ug | INTRAMUSCULAR | Status: DC | PRN

## 2012-04-07 MED ORDER — HYDROMORPHONE HCL PF 1 MG/ML IJ SOLN
1.0000 mg | Freq: Once | INTRAMUSCULAR | Status: AC
  Administered 2012-04-07: 1 mg via INTRAVENOUS
  Filled 2012-04-07: qty 1

## 2012-04-07 MED ORDER — MUPIROCIN 2 % EX OINT
TOPICAL_OINTMENT | Freq: Two times a day (BID) | CUTANEOUS | Status: DC
  Filled 2012-04-07: qty 22

## 2012-04-07 MED ORDER — LACTATED RINGERS IV SOLN: INTRAVENOUS | Status: DC

## 2012-04-07 MED ORDER — CIPROFLOXACIN IN D5W 400 MG/200ML IV SOLN
400.0000 mg | INTRAVENOUS | Status: AC
  Administered 2012-04-07: 400 mg via INTRAVENOUS

## 2012-04-07 MED ORDER — PROPOFOL 10 MG/ML IV BOLUS
INTRAVENOUS | Status: DC | PRN
  Administered 2012-04-07: 200 mg via INTRAVENOUS

## 2012-04-07 SURGICAL SUPPLY — 19 items
BAG URO CATCHER STRL LF (DRAPE) ×2 IMPLANT
BASKET LASER NITINOL 1.9FR (BASKET) ×1 IMPLANT
BASKET STNLS GEMINI 4WIRE 3FR (BASKET) ×1 IMPLANT
BASKET ZERO TIP NITINOL 2.4FR (BASKET) ×1 IMPLANT
BSKT STON RTRVL 120 1.9FR (BASKET)
BSKT STON RTRVL GEM 120X11 3FR (BASKET) ×1
BSKT STON RTRVL ZERO TP 2.4FR (BASKET) ×1
CATH URET 5FR 28IN OPEN ENDED (CATHETERS) ×2 IMPLANT
CLOTH BEACON ORANGE TIMEOUT ST (SAFETY) ×2 IMPLANT
DRAPE CAMERA CLOSED 9X96 (DRAPES) ×2 IMPLANT
GLOVE SURG SS PI 8.0 STRL IVOR (GLOVE) ×2 IMPLANT
GOWN PREVENTION PLUS XLARGE (GOWN DISPOSABLE) ×1 IMPLANT
GOWN STRL REIN XL XLG (GOWN DISPOSABLE) ×4 IMPLANT
GUIDEWIRE STR DUAL SENSOR (WIRE) ×2 IMPLANT
MANIFOLD NEPTUNE II (INSTRUMENTS) ×2 IMPLANT
PACK CYSTO (CUSTOM PROCEDURE TRAY) ×2 IMPLANT
SHEATH URET 14/16 FRX35CM (MISCELLANEOUS) ×1 IMPLANT
STENT PERCUFLEX 4.8FRX26 (STENTS) ×1 IMPLANT
TUBING CONNECTING 10 (TUBING) ×2 IMPLANT

## 2012-04-07 NOTE — Preoperative (Signed)
Beta Blockers   Reason not to administer Beta Blockers:Not Applicable 

## 2012-04-07 NOTE — Transfer of Care (Signed)
Immediate Anesthesia Transfer of Care Note  Patient: Casey HENNON Sr.  Procedure(s) Performed: Procedure(s) (LRB) with comments: CYSTOSCOPY/RETROGRADE/URETEROSCOPY/STONE EXTRACTION WITH BASKET (Bilateral) - cystoscopy, bilateral stent removal, bilateral ureteroscopy with bilateral stone extraction and insertion of right ureteral stent  Patient Location: PACU  Anesthesia Type:General  Level of Consciousness: awake, alert , oriented and patient cooperative  Airway & Oxygen Therapy: Patient Spontanous Breathing and Patient connected to face mask oxygen  Post-op Assessment: Report given to PACU RN and Post -op Vital signs reviewed and stable  Post vital signs: Reviewed and stable  Complications: No apparent anesthesia complications

## 2012-04-07 NOTE — Anesthesia Postprocedure Evaluation (Signed)
  Anesthesia Post-op Note  Patient: Casey ESHELMAN Sr.  Procedure(s) Performed: Procedure(s) (LRB): CYSTOSCOPY/RETROGRADE/URETEROSCOPY/STONE EXTRACTION WITH BASKET (Bilateral)  Patient Location: PACU  Anesthesia Type: General  Level of Consciousness: awake and alert   Airway and Oxygen Therapy: Patient Spontanous Breathing  Post-op Pain: mild  Post-op Assessment: Post-op Vital signs reviewed, Patient's Cardiovascular Status Stable, Respiratory Function Stable, Patent Airway and No signs of Nausea or vomiting  Last Vitals:  Filed Vitals:   04/07/12 1715  BP: 118/76  Pulse: 76  Temp:   Resp: 17    Post-op Vital Signs: stable   Complications: No apparent anesthesia complications

## 2012-04-07 NOTE — Anesthesia Preprocedure Evaluation (Signed)
Anesthesia Evaluation  Patient identified by MRN, date of birth, ID band Patient awake    Reviewed: Allergy & Precautions, H&P , NPO status , Patient's Chart, lab work & pertinent test results  History of Anesthesia Complications (+) PONV  Airway Mallampati: II TM Distance: >3 FB Neck ROM: full    Dental No notable dental hx.    Pulmonary neg pulmonary ROS,  breath sounds clear to auscultation  Pulmonary exam normal       Cardiovascular Exercise Tolerance: Good hypertension, Pt. on medications negative cardio ROS  Rhythm:regular Rate:Normal     Neuro/Psych negative neurological ROS  negative psych ROS   GI/Hepatic negative GI ROS, Neg liver ROS, GERD-  Medicated and Controlled,  Endo/Other  negative endocrine ROS  Renal/GU Renal diseasenegative Renal ROSChronic kidney disease  negative genitourinary   Musculoskeletal   Abdominal   Peds  Hematology negative hematology ROS (+)   Anesthesia Other Findings   Reproductive/Obstetrics negative OB ROS                           Anesthesia Physical Anesthesia Plan  ASA: II  Anesthesia Plan: General   Post-op Pain Management:    Induction: Intravenous  Airway Management Planned: LMA  Additional Equipment:   Intra-op Plan:   Post-operative Plan:   Informed Consent: I have reviewed the patients History and Physical, chart, labs and discussed the procedure including the risks, benefits and alternatives for the proposed anesthesia with the patient or authorized representative who has indicated his/her understanding and acceptance.   Dental Advisory Given  Plan Discussed with: CRNA and Surgeon  Anesthesia Plan Comments:         Anesthesia Quick Evaluation

## 2012-04-07 NOTE — H&P (Signed)
oblems Problems  1. Benign Prostatic Hypertrophy 600.00 2. Fatigue 780.79 3. Nephrolithiasis 592.0 4. PSA,Elevated 790.93 5. Pyuria 791.9  History of Present Illness     Casey [name] returns today with the onset Monday night of right lower quadrant pain that was severe.  He stopped making urine and went to the [facility] ER.  He was found to have bilateral obstructing ureteral stones and had bilateral stents placed.  There is a 2mm right proximal stone and a 4mm left mid ureteral stone.   He has small renal stones as well.   He is still feeling bad with the stents because of the bladder spasms.  and he has LLQ pain now as well.  He has hematuria.  He has had a fever but only to 99.7.  He got a prophylactic antibiotic but is not on any now.  He has no nausea at this time.  He has had multiple stones with the most recent analysis showing mixed calcium stones.  He has not had any problems since 6/12 and he had changed his diet.   Past Medical History Problems  1. History of  Adult Sleep Apnea 780.57 2. History of  Diffuse Abdominal Pain 789.07 3. History of  Dysuria 788.1 4. History of  Epididymitis 604.90 5. History of  Heartburn 787.1 6. History of  Hypertension 401.9 7. History of  Proximal Ureteral Stone On The Left 592.1 8. History of  Venous Thrombosis Of The Deep Vessels Of The Lower Extremity 453.40  Surgical History Problems  1. History of  Biopsy Of The Prostate Needle 2. History of  Cystoscopy With Insertion Of Ureteral Stent Left 3. History of  Cystoscopy With Ureteroscopy With Removal Of Calculus 4. History of  Foot Surgery Right 5. History of  Foot Surgery Left 6. History of  Knee Surgery Left 7. History of  Shoulder Surgery Left 8. History of  Wrist Surgery Left  Current Meds 1. Aciphex 20 MG Oral Tablet Delayed Release; Therapy: 10Aug2011 to 2. Aspirin 81 MG Oral Tablet; Therapy: (Recorded:15May2012) to 3. Cetirizine HCl 10 MG Oral Tablet; Therapy: (Recorded:09Mar2012)  to 4. ClonazePAM 2 MG Oral Tablet; Therapy: 08Oct2011 to 5. Cyclobenzaprine HCl 10 MG Oral Tablet; Therapy: 23Sep2011 to 6. Docusate Sodium 100 MG Oral Capsule; Therapy: (Recorded:27Aug2012) to 7. Glucosamine-Chondroitin 750-600 MG Oral Tablet; Therapy: (Recorded:15May2012) to 8. Lisinopril-Hydrochlorothiazide 10-12.5 MG Oral Tablet; Therapy: 24Mar2013 to 9. Melatonin 3 MG Oral Tablet; Therapy: (Recorded:27Aug2012) to 10. Multi-Vitamin TABS; Therapy: (Recorded:09Mar2012) to 11. Omega 3 CAPS; Therapy: (Recorded:09Mar2012) to 12. Omeprazole 20 MG Oral Tablet Delayed Release; Therapy: (Recorded:27Aug2012) to 13. Promethazine HCl 25 MG Oral Tablet; Therapy: 11May2012 to 14. TraZODone HCl 100 MG Oral Tablet; Therapy: 02Aug2011 to 15. Ultram TABS; Therapy: (Recorded:27Aug2012) to 16. Vitamin C 500 MG Oral Tablet; Therapy: (Recorded:09Mar2012) to 17. Vitamin D TABS; Therapy: (Recorded:09Mar2012) to  Allergies Medication  1. Vicodin TABS 2. Codeine Derivatives  Family History Problems  1. Maternal history of  Arthritis V17.7 2. Maternal history of  Bladder Cancer V16.52 3. Family history of  Blood In Urine 4. Family history of  Family Health Status Number Of Children 1 son/ 1 dugh 5. Fraternal history of  Heart Disease V17.49 6. Maternal history of  Lung Cancer V16.1 7. Fraternal history of  Nephrolithiasis 8. Fraternal history of  Renal Failure  Social History Problems  1. Caffeine Use 2 2. Marital History - Currently Married 3. Never A Smoker 4. Occupation: works with [employer]. Denied  5. History of  Alcohol Use 6.  History of  Tobacco Use  Review of Systems Genitourinary, constitutional, skin, eye, otolaryngeal, hematologic/lymphatic, cardiovascular, pulmonary, endocrine, musculoskeletal, gastrointestinal, neurological and psychiatric system(s) were reviewed and pertinent findings if present are noted.  Genitourinary: feelings of urinary urgency.  Gastrointestinal: flank  pain and abdominal pain.  Constitutional: fever.  Cardiovascular: no chest pain.  Respiratory: no shortness of breath.    Vitals Vital Signs [Data Includes: Last 1 Day]  06Feb2014 09:55AM  Temperature: 99.2 F 06Feb2014 09:42AM  Blood Pressure: 138 / 87 Temperature: 98.37 F Heart Rate: 84  Physical Exam Constitutional: Well nourished and well developed . Ill appearing.  Pulmonary: No respiratory distress and normal respiratory rhythm and effort.  Cardiovascular: Heart rate and rhythm are normal . No peripheral edema.    Results/Data Urine [Data Includes: Last 1 Day]   06Feb2014  COLOR BROWN   APPEARANCE CLOUDY   SPECIFIC GRAVITY 1.025   pH 5.5   GLUCOSE 100 mg/dL  BILIRUBIN MOD   KETONE NEG mg/dL  BLOOD LARGE   PROTEIN > 300 mg/dL  UROBILINOGEN 4 mg/dL  NITRITE POS   LEUKOCYTE ESTERASE TRACE   SQUAMOUS EPITHELIAL/HPF NONE SEEN   WBC 0-2 WBC/hpf  RBC TNTC RBC/hpf  BACTERIA NONE SEEN   CRYSTALS NONE SEEN   CASTS NONE SEEN    The following images/tracing/specimen were independently visualized:  I have reviewed his CT films and report. KUB today shows the stents in good position bilaterally but I don't see the stones. The film is otherwise unremarkable.    Procedure  Toradol 60mg  IM given.     Assessment Assessed  1. Proximal Ureteral Stone On The Right 592.1 2. Mid Ureteral Stone On The Left 592.1 3. Nephrolithiasis 592.0   He has bilateral small ureteral stones with stents which he is tolerating poorly.   Plan Health Maintenance (V70.0)  1. UA With REFLEX  Done: 06Feb2014 09:04AM Nephrolithiasis (592.0)  2. Ketorolac Tromethamine 60 MG/2ML Intramuscular Solution; INJECT 60  MG Intramuscular;  Done: 06Feb2014 10:06AM; Status: COMPLETE   I am going to set him up for bilateral ureteroscopy for later today and reviewed the risks in detail.

## 2012-04-07 NOTE — Brief Op Note (Signed)
04/07/2012  4:56 PM  PATIENT:  Casey Iba Sr.  57 y.o. male  PRE-OPERATIVE DIAGNOSIS:  bilateral obstruction from ureteral stones.  POST-OPERATIVE DIAGNOSIS:  bilateral obstruction  PROCEDURE:  Procedure(s) (LRB) with comments: CYSTOSCOPY/RETROGRADE/URETEROSCOPY/STONE EXTRACTION WITH BASKET (Bilateral) - cystoscopy, bilateral stent removal, bilateral ureteroscopy with bilateral stone extraction and insertion of right ureteral stent  SURGEON:  Surgeon(s) and Role:    * Anner Crete, MD - Primary  PHYSICIAN ASSISTANT:   ASSISTANTS: none   ANESTHESIA:   general  EBL:  Total I/O In: 1000 [I.V.:1000] Out: -   BLOOD ADMINISTERED:none  DRAINS: 4.81fr x 26cm JJ stent with string   LOCAL MEDICATIONS USED:  LIDOCAINE   SPECIMEN:  Source of Specimen:  right and left ureteral stones.   DISPOSITION OF SPECIMEN:  to family  COUNTS:  YES  TOURNIQUET:  * No tourniquets in log *  DICTATION: .Other Dictation: Dictation Number [id number]  PLAN OF CARE: Discharge to home after PACU  PATIENT DISPOSITION:  PACU - hemodynamically stable.   Delay start of Pharmacological VTE agent (>24hrs) due to surgical blood loss or risk of bleeding: not applicable

## 2012-04-08 ENCOUNTER — Emergency Department (HOSPITAL_COMMUNITY)
Admission: EM | Admit: 2012-04-08 | Discharge: 2012-04-09 | Disposition: A | Discharge status: Home / Self Care | Payer: 59 | Attending: Emergency Medicine | Admitting: Emergency Medicine

## 2012-04-08 ENCOUNTER — Encounter (HOSPITAL_COMMUNITY): Payer: Self-pay | Admitting: Urology

## 2012-04-08 DIAGNOSIS — N200 Calculus of kidney: Secondary | ICD-10-CM | POA: Insufficient documentation

## 2012-04-08 NOTE — ED Notes (Signed)
Pt c/o severe R flank pain, nausea, cysto with stone removal yesterday, worsening pain onset this evening, pt removed his own stent.

## 2012-04-08 NOTE — Discharge Summary (Signed)
Physician Discharge Summary  Patient ID: JOHNEDWARD BRODRICK Sr. MRN: [id number] DOB/AGE: Feb 06, 1956 57 y.o.  Admit date: 04/07/2012 Discharge date: 04/08/2012  Admission Diagnoses:  Bilateral Ureteral stones  Discharge Diagnoses: Same Active Problems:  * No active hospital problems. *    Discharged Condition: good  Hospital Course: Mr. Sahni was taken to the operating room for ureteroscopic extraction of bilateral ureteral stones.  He had a right 4.8x26 fr stent with string placed.  He was discharged home.   Consults: None  Significant Diagnostic Studies: none  Treatments: surgery: as above  Discharge Exam: Blood pressure 134/75, pulse 64, temperature 98.5 F (36.9 C), temperature source Oral, resp. rate 15, height 5\' 6"  (1.676 m), weight 95.255 kg (210 lb), SpO2 96.00%. General appearance: alert and no distress  Disposition: 01-Home or Self Care     Medication List     As of 04/08/2012  7:31 AM    STOP taking these medications         ketorolac 30 MG/ML injection   Commonly known as: TORADOL      TAKE these medications         cetirizine 10 MG tablet   Commonly known as: ZYRTEC   Take 10 mg by mouth daily.      cholecalciferol 1000 UNITS tablet   Commonly known as: VITAMIN D   Take 1,000 Units by mouth daily.      clonazePAM 2 MG tablet   Commonly known as: KLONOPIN   Take 2 tablets (4 mg total) by mouth at bedtime as needed for anxiety. Needs office visit for more      cyclobenzaprine 10 MG tablet   Commonly known as: FLEXERIL   Take 4 pills at bedtime      docusate sodium 100 MG capsule   Commonly known as: COLACE   Take 200 mg by mouth 2 (two) times daily.      fish oil-omega-3 fatty acids 1000 MG capsule   Take 1 g by mouth daily.      HYDROmorphone 2 MG tablet   Commonly known as: DILAUDID   Take 1 tablet (2 mg total) by mouth every 6 (six) hours as needed. Pain      lisinopril-hydrochlorothiazide 20-12.5 MG per tablet   Commonly known as:  PRINZIDE,ZESTORETIC   Take 1 tablet by mouth every evening.      Melatonin 3 MG Tabs   Take 1 tablet by mouth at bedtime.      multivitamin with minerals Tabs   Take 1 tablet by mouth daily.      phenazopyridine 200 MG tablet   Commonly known as: PYRIDIUM   Take 200 mg by mouth 3 (three) times daily.      promethazine 25 MG tablet   Commonly known as: PHENERGAN   Take 25 mg by mouth every 6 (six) hours as needed. nausea      RABEprazole 20 MG tablet   Commonly known as: ACIPHEX   Take 1 tablet (20 mg total) by mouth daily.      Tamsulosin HCl 0.4 MG Caps   Commonly known as: FLOMAX   Take 1 capsule (0.4 mg total) by mouth daily.      traMADol 50 MG tablet   Commonly known as: ULTRAM   Take 1 tablet (50 mg total) by mouth 2 (two) times daily.      traZODone 100 MG tablet   Commonly known as: DESYREL   Take 2 tablets at bedtime  vitamin C 500 MG tablet   Commonly known as: ASCORBIC ACID   Take 500 mg by mouth daily.           Follow-up Information    Follow up with [facility], NP. On 04/19/2012. (9)    Contact information:   [address] [facility] [place] [address] [phone]          Signed: Anner Crete 04/08/2012, 7:31 AM

## 2012-04-08 NOTE — Op Note (Signed)
NAMEMALEEK, CRAVER NO.:  [id number]  MEDICAL RECORD NO.:  [id number]  LOCATION:  [facility]                         FACILITY:  [facility]  PHYSICIAN:  Excell Seltzer. Annabell Howells, M.D.    DATE OF BIRTH:  February 02, 1956  DATE OF PROCEDURE:  04/07/2012 DATE OF DISCHARGE:  04/07/2012                              OPERATIVE REPORT   PROCEDURE: 1. Cystoscopy with removal of bilateral double-J stents. 2. Left ureteroscopic stone extraction. 3. Right ureteroscopic stone extraction with replacement of right     double-J stent.  PREOPERATIVE DIAGNOSIS:  Bilateral ureteral stones.  POSTOPERATIVE DIAGNOSIS:  Bilateral ureteral stones.  SURGEON:  Excell Seltzer. Annabell Howells, M.D.  ANESTHESIA:  General.  SPECIMEN:  Right and left stones.  DRAINS:  A 4.8-French x 26-cm right double-J stent.  COMPLICATIONS:  None.  INDICATIONS:  Casey [name] is a 57 year old white male with a history of recurrent urolithiasis who had the onset earlier this week of flank pain.  His pain was severe and he stopped making urine.  He was seen in the Emergency Room of the [facility].  A CT scan demonstrated a 2-mm proximal stone with minimal obstruction and a 3.6-mm left mid ureteral stone with mild obstruction.  He underwent placement of bilateral double-J stents at that admission and returns today for definitive therapy.  He is very uncomfortable with the stents.  FINDINGS OF PROCEDURE:  He was given Cipro and taken to the operating room where general anesthetic was induced.  He was placed in lithotomy position.  His perineum and genitalia were prepped with Betadine solution.  He was draped in usual sterile fashion.  Cystoscopy was performed using a 22-French scope and a 12-degree lens. Examination revealed a normal urethra.  The prostatic urethra was short without obstruction but there were some mucosal tearing from his recent cystoscopy.  The ureteral stents were visualized extending ureteral orifices.   No other significant bladder wall abnormalities were noted.  The right ureteral stent was grasped initially and pulled to the urethral meatus.  A guidewire was then passed to the kidney and the stent was removed.  The right guidewire was clamped to the side and on the drapes and the cystoscope was reinserted.  The left stent was then pulled the urethral meatus, and removed over a guidewire.  A 6.5-French short ureteroscope was then inserted alongside the left wire up the ureteral orifice.  The stone was visualized in the mid ureter.  It was then noted on the CT scan.  It was engaged with a Nitinol basket and removed.  The stone dropped out of the basket in the urethra but was subsequently retrieved.  At this point, the 6.5-French short ureteroscope was advanced up the right ureter was little tight, so a 14-French introducer sheath inner core was used to dilate the distal and mid ureter and dilated quite easily.  A 6.5-French scope was then advanced to the mid ureter to just above the iliacs which was full extent that I could pass the scope.  I then advanced a Gemini basket to the UPJ and retrieved it with the basket open.  A small stone was entrapped in the basket and this was  removed.  At this point, the cystoscope was reinserted over the right wire after the left wire had been removed and a 4.7-French x 26-cm double-J stent was inserted to the right kidney under fluoroscopic guidance without difficulty.  The wire was removed leaving good coil in the bladder and good coil in the kidney.  At this point, the bladder was evacuated free of clots.  In these clots, another small stone fragment was retrieved.  The patient's bladder was drained.  The cystoscope was removed.  The stent string was left exiting urethra.  The urethra was instilled with 2% lidocaine jelly and a B and O suppository was placed.  The patient's anesthetic was reversed.  He was taken out lithotomy position,  his anesthetic was reversed.  He was moved to recovery in stable condition. There were no complications.     Excell Seltzer. Annabell Howells, M.D.     JJW/MEDQ  D:  04/07/2012  T:  04/07/2012  Job:  [id number]

## 2012-04-09 ENCOUNTER — Ambulatory Visit (HOSPITAL_COMMUNITY)
Admission: RE | Admit: 2012-04-09 | Discharge: 2012-04-09 | Disposition: A | Discharge status: Home / Self Care | Payer: 59 | Source: Ambulatory Visit | Attending: Emergency Medicine | Admitting: Emergency Medicine

## 2012-04-09 ENCOUNTER — Other Ambulatory Visit (HOSPITAL_COMMUNITY): Payer: Self-pay | Admitting: Emergency Medicine

## 2012-04-09 DIAGNOSIS — E279 Disorder of adrenal gland, unspecified: Secondary | ICD-10-CM | POA: Insufficient documentation

## 2012-04-09 DIAGNOSIS — N201 Calculus of ureter: Secondary | ICD-10-CM | POA: Insufficient documentation

## 2012-04-09 DIAGNOSIS — Q8909 Congenital malformations of spleen: Secondary | ICD-10-CM | POA: Insufficient documentation

## 2012-04-09 DIAGNOSIS — K7689 Other specified diseases of liver: Secondary | ICD-10-CM | POA: Insufficient documentation

## 2012-04-09 DIAGNOSIS — Z9089 Acquired absence of other organs: Secondary | ICD-10-CM | POA: Insufficient documentation

## 2012-04-09 DIAGNOSIS — R109 Unspecified abdominal pain: Secondary | ICD-10-CM | POA: Insufficient documentation

## 2012-04-09 DIAGNOSIS — J9819 Other pulmonary collapse: Secondary | ICD-10-CM | POA: Insufficient documentation

## 2012-04-09 DIAGNOSIS — N2889 Other specified disorders of kidney and ureter: Secondary | ICD-10-CM | POA: Insufficient documentation

## 2012-04-09 DIAGNOSIS — N200 Calculus of kidney: Secondary | ICD-10-CM | POA: Insufficient documentation

## 2012-04-09 DIAGNOSIS — K8689 Other specified diseases of pancreas: Secondary | ICD-10-CM | POA: Insufficient documentation

## 2012-04-09 LAB — CBC WITH DIFFERENTIAL/PLATELET
Eosinophils Absolute: 0.2 10*3/uL (ref 0.0–0.7)
Lymphocytes Relative: 18 % (ref 12–46)
Lymphs Abs: 2.2 10*3/uL (ref 0.7–4.0)
MCH: 31.8 pg (ref 26.0–34.0)
Neutrophils Relative %: 73 % (ref 43–77)
Platelets: 219 10*3/uL (ref 150–400)
RBC: 4.21 MIL/uL — ABNORMAL LOW (ref 4.22–5.81)
WBC: 12.4 10*3/uL — ABNORMAL HIGH (ref 4.0–10.5)

## 2012-04-09 LAB — BASIC METABOLIC PANEL
GFR calc non Af Amer: 64 mL/min — ABNORMAL LOW (ref >90)
Glucose, Bld: 132 mg/dL — ABNORMAL HIGH (ref 70–99)
Potassium: 3.3 mEq/L — ABNORMAL LOW (ref 3.5–5.1)
Sodium: 137 mEq/L (ref 135–145)

## 2012-04-09 MED ORDER — HYDROMORPHONE HCL PF 1 MG/ML IJ SOLN
INTRAMUSCULAR | Status: DC
Start: 2012-04-09 — End: 2012-04-09
  Filled 2012-04-09: qty 1

## 2012-04-09 MED ORDER — ONDANSETRON HCL 4 MG/2ML IJ SOLN
INTRAMUSCULAR | Status: AC
  Filled 2012-04-09: qty 2

## 2012-04-09 MED ORDER — HYDROMORPHONE HCL PF 1 MG/ML IJ SOLN
INTRAMUSCULAR | Status: AC
  Filled 2012-04-09: qty 1

## 2012-04-12 MED FILL — Ondansetron HCl Inj 4 MG/2ML (2 MG/ML): INTRAMUSCULAR | Qty: 2 | Status: AC

## 2012-04-12 MED FILL — Hydromorphone HCl Inj 1 MG/ML: INTRAMUSCULAR | Qty: 1 | Status: AC

## 2012-05-31 ENCOUNTER — Telehealth: Payer: Self-pay

## 2012-05-31 NOTE — Telephone Encounter (Signed)
This is okay to refill. He can have this refill plus one more.

## 2012-05-31 NOTE — Telephone Encounter (Signed)
Pharmacy requesting 90 day supply for Clonazepam 2 mg.

## 2012-06-01 ENCOUNTER — Telehealth: Payer: Self-pay | Admitting: Physician Assistant

## 2012-06-01 MED ORDER — CLONAZEPAM 2 MG PO TABS: 4.0000 mg | ORAL_TABLET | Freq: Every evening | ORAL | Status: DC | PRN

## 2012-06-01 NOTE — Telephone Encounter (Signed)
Faxed

## 2012-06-01 NOTE — Telephone Encounter (Signed)
[facility] requesting RF on clonazepam.  Please advise

## 2012-06-02 NOTE — Telephone Encounter (Signed)
Is okay to give him refills on Klonopin and this can be done for 6 months.

## 2012-06-02 NOTE — Telephone Encounter (Signed)
This has been done.

## 2012-07-08 ENCOUNTER — Other Ambulatory Visit: Payer: Self-pay | Admitting: Emergency Medicine

## 2012-09-29 ENCOUNTER — Other Ambulatory Visit: Payer: Self-pay | Admitting: Emergency Medicine

## 2012-09-30 ENCOUNTER — Other Ambulatory Visit: Payer: Self-pay | Admitting: Radiology

## 2012-09-30 NOTE — Telephone Encounter (Signed)
Dr Cleta Alberts, do you want to Rf or need to see pt?

## 2012-09-30 NOTE — Telephone Encounter (Signed)
faxed

## 2012-11-08 ENCOUNTER — Other Ambulatory Visit: Payer: Self-pay | Admitting: Emergency Medicine

## 2012-11-11 ENCOUNTER — Other Ambulatory Visit: Payer: Self-pay | Admitting: Radiology

## 2012-12-07 ENCOUNTER — Other Ambulatory Visit: Payer: Self-pay | Admitting: Emergency Medicine

## 2012-12-08 ENCOUNTER — Ambulatory Visit: Payer: 59

## 2012-12-08 ENCOUNTER — Ambulatory Visit (INDEPENDENT_AMBULATORY_CARE_PROVIDER_SITE_OTHER): Payer: 59 | Admitting: Emergency Medicine

## 2012-12-08 VITALS — BP 110/70 | HR 64 | Temp 97.8°F | Resp 16 | Ht 66.25 in | Wt 188.0 lb

## 2012-12-08 DIAGNOSIS — Z23 Encounter for immunization: Secondary | ICD-10-CM

## 2012-12-08 DIAGNOSIS — M25579 Pain in unspecified ankle and joints of unspecified foot: Secondary | ICD-10-CM

## 2012-12-08 DIAGNOSIS — M25572 Pain in left ankle and joints of left foot: Secondary | ICD-10-CM
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
## 2013-01-17 ENCOUNTER — Telehealth: Payer: Self-pay | Admitting: Family Medicine

## 2013-01-17 ENCOUNTER — Other Ambulatory Visit: Payer: Self-pay | Admitting: Emergency Medicine

## 2013-01-17 NOTE — Telephone Encounter (Signed)
Pt requesting refill for clonazepam(has scheduled pe with dr Cleta Alberts but will run out beforehand)   Best phone [phone]  Ph[facility]acy [facility] [address],[place]

## 2013-01-17 NOTE — Telephone Encounter (Signed)
Called pt to pick up Rx at 102

## 2013-01-29 ENCOUNTER — Ambulatory Visit: Payer: 59

## 2013-01-29 ENCOUNTER — Ambulatory Visit (INDEPENDENT_AMBULATORY_CARE_PROVIDER_SITE_OTHER): Payer: 59 | Admitting: Emergency Medicine

## 2013-01-29 VITALS — BP 140/90 | HR 71 | Temp 98.6°F | Resp 18 | Ht 65.25 in | Wt 192.0 lb

## 2013-01-29 DIAGNOSIS — M25572 Pain in left ankle and joints of left foot: Secondary | ICD-10-CM

## 2013-01-29 DIAGNOSIS — M79609 Pain in unspecified limb: Secondary | ICD-10-CM

## 2013-01-29 DIAGNOSIS — M79672 Pain in left foot: Secondary | ICD-10-CM

## 2013-01-29 DIAGNOSIS — M25579 Pain in unspecified ankle and joints of unspecified foot: Secondary | ICD-10-CM
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
## 2013-02-02 ENCOUNTER — Other Ambulatory Visit: Payer: Self-pay | Admitting: Orthopedic Surgery

## 2013-02-02 DIAGNOSIS — R531 Weakness: Secondary | ICD-10-CM

## 2013-02-02 DIAGNOSIS — R52 Pain, unspecified: Secondary | ICD-10-CM

## 2013-02-06 ENCOUNTER — Ambulatory Visit
Admission: RE | Admit: 2013-02-06 | Discharge: 2013-02-06 | Disposition: A | Discharge status: Home / Self Care | Payer: 59 | Source: Ambulatory Visit | Attending: Orthopedic Surgery | Admitting: Orthopedic Surgery

## 2013-02-06 DIAGNOSIS — R52 Pain, unspecified: Secondary | ICD-10-CM

## 2013-02-06 DIAGNOSIS — R531 Weakness: Secondary | ICD-10-CM

## 2013-02-07 ENCOUNTER — Other Ambulatory Visit: Payer: Self-pay | Admitting: Emergency Medicine

## 2013-02-08 ENCOUNTER — Other Ambulatory Visit: Payer: Self-pay | Admitting: Emergency Medicine

## 2013-02-08 ENCOUNTER — Other Ambulatory Visit: Payer: Self-pay | Admitting: *Deleted

## 2013-02-08 ENCOUNTER — Telehealth: Payer: Self-pay | Admitting: *Deleted

## 2013-02-08 MED ORDER — LISINOPRIL-HYDROCHLOROTHIAZIDE 20-12.5 MG PO TABS: 1.0000 | ORAL_TABLET | Freq: Every evening | ORAL | Status: DC

## 2013-02-08 MED ORDER — TRAMADOL HCL 50 MG PO TABS: 50.0000 mg | ORAL_TABLET | Freq: Two times a day (BID) | ORAL | Status: DC

## 2013-02-08 NOTE — Telephone Encounter (Signed)
Pt needs a refill for tramadol [facility].  He has appt on 03/14/12.  I have already refilled his blood pressure medication.

## 2013-02-09 NOTE — Telephone Encounter (Signed)
Okay to print out a refill and I will sign it.

## 2013-02-09 NOTE — Telephone Encounter (Signed)
done

## 2013-02-13 ENCOUNTER — Other Ambulatory Visit: Payer: Self-pay | Admitting: Emergency Medicine

## 2013-02-14 ENCOUNTER — Telehealth: Payer: Self-pay

## 2013-02-14 MED ORDER — CLONAZEPAM 2 MG PO TABS: ORAL_TABLET | ORAL | Status: DC

## 2013-02-14 NOTE — Telephone Encounter (Signed)
Pt hasn't been seen for insomnia for >yr, but has appt for CPE sch 03/14/13. Can we get him a 90 day RF to mailorder?

## 2013-02-14 NOTE — Telephone Encounter (Signed)
[facility] sent req for RF of Clonazepam. 90 day supply? I have pended this according to what you just RFd tramadol for.

## 2013-02-14 NOTE — Telephone Encounter (Signed)
faxed

## 2013-02-14 NOTE — Telephone Encounter (Signed)
Meds ordered this encounter  Medications  . clonazePAM (KLONOPIN) 2 MG tablet    Sig: TAKE 2 TABLETS BY MOUTH AT BEDTIME AS NEEDED FOR ANXIETY    Dispense:  180 tablet    Refill:  0

## 2013-02-15 ENCOUNTER — Ambulatory Visit: Payer: 59 | Admitting: Physical Therapy

## 2013-02-16 ENCOUNTER — Other Ambulatory Visit: Payer: Self-pay | Admitting: Emergency Medicine

## 2013-02-16 NOTE — Telephone Encounter (Signed)
Dr Cleta Alberts just OKd 90 day supply of Klonopin, so per Lanora Manis OK to RF this also.

## 2013-02-18 ENCOUNTER — Telehealth: Payer: Self-pay

## 2013-02-18 NOTE — Telephone Encounter (Signed)
Pt request for medication refill Clonazepan, Pt called this morning, no response yet[facility] [address]

## 2013-02-18 NOTE — Telephone Encounter (Signed)
PLEASE CALL PT @ [phone] WHEN READY

## 2013-02-18 NOTE — Telephone Encounter (Signed)
Patient is out of his Clonazepam and has an appointment scheduled with Da[facility] in the middle of January.  He wants to know if [facility].

## 2013-02-18 NOTE — Telephone Encounter (Signed)
Okay to get a refill on his Klonopin

## 2013-02-19 MED ORDER — CLONAZEPAM 2 MG PO TABS: ORAL_TABLET | ORAL | Status: DC

## 2013-02-19 NOTE — Telephone Encounter (Signed)
[facility] until [facility] comes in. Pt notified.

## 2013-03-14 ENCOUNTER — Ambulatory Visit (INDEPENDENT_AMBULATORY_CARE_PROVIDER_SITE_OTHER): Payer: 59 | Admitting: Emergency Medicine

## 2013-03-14 ENCOUNTER — Encounter: Payer: Self-pay | Admitting: Emergency Medicine

## 2013-03-14 VITALS — BP 136/92 | HR 82 | Temp 98.0°F | Resp 16 | Ht 65.0 in | Wt 194.0 lb

## 2013-03-14 DIAGNOSIS — G4700 Insomnia, unspecified: Secondary | ICD-10-CM

## 2013-03-14 DIAGNOSIS — Z0000 Encounter for general adult medical examination without abnormal findings: Secondary | ICD-10-CM

## 2013-03-14 LAB — POCT URINALYSIS DIPSTICK
Bilirubin, UA: NEGATIVE
Blood, UA: NEGATIVE
GLUCOSE UA: NEGATIVE
Ketones, UA: NEGATIVE
Leukocytes, UA: NEGATIVE
NITRITE UA: NEGATIVE
Protein, UA: NEGATIVE
Spec Grav, UA: 1.02
UROBILINOGEN UA: 0.2
pH, UA: 8

## 2013-03-14 LAB — COMPLETE METABOLIC PANEL WITH GFR
ALT: 21 U/L (ref 0–53)
AST: 18 U/L (ref 0–37)
Albumin: 4 g/dL (ref 3.5–5.2)
Alkaline Phosphatase: 61 U/L (ref 39–117)
BILIRUBIN TOTAL: 0.7 mg/dL (ref 0.3–1.2)
BUN: 24 mg/dL — AB (ref 6–23)
CALCIUM: 8.8 mg/dL (ref 8.4–10.5)
CHLORIDE: 104 meq/L (ref 96–112)
CO2: 26 meq/L (ref 19–32)
CREATININE: 1.02 mg/dL (ref 0.50–1.35)
GFR, Est Non African American: 81 mL/min
Glucose, Bld: 81 mg/dL (ref 70–99)
Potassium: 3.8 mEq/L (ref 3.5–5.3)
Sodium: 140 mEq/L (ref 135–145)
Total Protein: 6.4 g/dL (ref 6.0–8.3)

## 2013-03-14 LAB — LIPID PANEL
CHOLESTEROL: 161 mg/dL (ref 0–200)
HDL: 40 mg/dL (ref >39)
LDL Cholesterol: 99 mg/dL (ref 0–99)
TRIGLYCERIDES: 110 mg/dL (ref <150)
Total CHOL/HDL Ratio: 4 Ratio
VLDL: 22 mg/dL (ref 0–40)

## 2013-03-14 LAB — IFOBT (OCCULT BLOOD): IMMUNOLOGICAL FECAL OCCULT BLOOD TEST: NEGATIVE

## 2013-03-14 LAB — CBC WITH DIFFERENTIAL/PLATELET
BASOS ABS: 0.1 10*3/uL (ref 0.0–0.1)
BASOS PCT: 1 % (ref 0–1)
Eosinophils Absolute: 0.3 10*3/uL (ref 0.0–0.7)
Eosinophils Relative: 5 % (ref 0–5)
HEMATOCRIT: 43.1 % (ref 39.0–52.0)
HEMOGLOBIN: 15.1 g/dL (ref 13.0–17.0)
LYMPHS PCT: 33 % (ref 12–46)
Lymphs Abs: 2.2 10*3/uL (ref 0.7–4.0)
MCH: 30.8 pg (ref 26.0–34.0)
MCHC: 35 g/dL (ref 30.0–36.0)
MCV: 88 fL (ref 78.0–100.0)
MONOS PCT: 5 % (ref 3–12)
Monocytes Absolute: 0.4 10*3/uL (ref 0.1–1.0)
NEUTROS ABS: 3.7 10*3/uL (ref 1.7–7.7)
NEUTROS PCT: 56 % (ref 43–77)
Platelets: 233 10*3/uL (ref 150–400)
RBC: 4.9 MIL/uL (ref 4.22–5.81)
RDW: 12.7 % (ref 11.5–15.5)
WBC: 6.6 10*3/uL (ref 4.0–10.5)
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
## 2013-03-14 NOTE — Progress Notes (Deleted)
   Subjective:    Patient ID: Casey Boettner., male    DOB: [birth year], 58 y.o.   MRN: [id number]  HPI    Review of Systems  Eyes: Positive for itching.  Hematological: Bruises/bleeds easily.  Psychiatric/Behavioral: Positive for sleep disturbance.       Objective:   Physical Exam        Assessment & Plan:

## 2013-03-15 LAB — TSH: TSH: 1.02 u[IU]/mL (ref 0.350–4.500)

## 2013-03-15 LAB — PSA, TOTAL AND FREE
PSA FREE PCT: 21 % — AB (ref >25)
PSA, Free: 0.63 ng/mL
PSA: 3.06 ng/mL (ref <=4.00)

## 2013-03-19 ENCOUNTER — Ambulatory Visit (INDEPENDENT_AMBULATORY_CARE_PROVIDER_SITE_OTHER): Payer: 59 | Admitting: Emergency Medicine

## 2013-03-19 ENCOUNTER — Encounter: Payer: Self-pay | Admitting: Emergency Medicine

## 2013-03-19 VITALS — BP 140/88 | HR 86 | Temp 98.7°F | Resp 16 | Ht 65.0 in | Wt 195.2 lb

## 2013-03-19 DIAGNOSIS — S0191XA Laceration without foreign body of unspecified part of head, initial encounter: Secondary | ICD-10-CM

## 2013-03-19 DIAGNOSIS — S0190XA Unspecified open wound of unspecified part of head, initial encounter: Secondary | ICD-10-CM

## 2013-03-19 DIAGNOSIS — R51 Headache: Secondary | ICD-10-CM

## 2013-03-19 DIAGNOSIS — Z23 Encounter for immunization: Secondary | ICD-10-CM

## 2013-03-19 NOTE — Progress Notes (Signed)
Subjective:   This chart was scribed for Casey Jordan, MD by Forrestine Him, [facility] Scribe. This patient was seen in room 7 and the patient's care was started 5:42 PM.     Patient ID: Casey [name]., male    DOB: 1955-07-20, 58 y.o.   MRN: [id number]  HPI  HPI Comments: [name]. is a 58 y.o. male who presents to [facility] complaining of a laceration to the crown of the head that occurred just prior to arrival. Pt states he was doing some work outside, and hit his head on some wood. He says he is unaware if his tetanus shot is UTD. Denies fever or chils. Reports a known allergy to codeine.   Review of Systems  Constitutional: Negative for fever and chills.  Skin: Positive for wound.    Past Medical History  Diagnosis Date  . Complication of anesthesia     nausea and vomiting  . PONV (postoperative nausea and vomiting)   . Hypertension   . Chronic kidney disease     multiple renal stone  . GERD (gastroesophageal reflux disease)   . Arthritis     joints  . COPD (chronic obstructive pulmonary disease)   . Kidney stones     Past Surgical History  Procedure Laterality Date  . Tonsillectomy    . Varicellectomy    . Meniscul    . Acl repair left knee    . Rotator cuff repair      left shoulder   . Ligaments repair left wrist     . Fusion great toe right foot    . Tendons cut on left heel    . Cholecystectomy    . Stone extractions and urethral stents      multiple  . Cardiac catheterization  2005    negative   . Skin cancer removed from left cheek    . Cystoscopy/retrograde/ureteroscopy/stone extraction with basket  04/07/2012    Procedure: CYSTOSCOPY/RETROGRADE/URETEROSCOPY/STONE EXTRACTION WITH BASKET;  Surgeon: Malka So, MD;  Location: [facility];  Service: Urology;  Laterality: Bilateral;  cystoscopy, bilateral stent removal, bilateral ureteroscopy with bilateral stone extraction and insertion of right ureteral stent      History   Social History  . Marital Status: Married    Spouse Name: N/A    Number of Children: N/A  . Years of Education: N/A   Occupational History  . Not on file.   Social History Main Topics  . Smoking status: Never Smoker   . Smokeless tobacco: Never Used  . Alcohol Use: No  . Drug Use: No  . Sexual Activity: Yes   Other Topics Concern  . Not on file   Social History Narrative  . No narrative on file    BP 140/88  Pulse 86  Temp(Src) 98.7 F (37.1 C) (Oral)  Resp 16  Ht 5\' 5"  (1.651 m)  Wt 195 lb 3.2 oz (88.542 kg)  BMI 32.48 kg/m2  SpO2 100%   Objective:   Physical Exam  CONSTITUTIONAL: Well developed/well nourished HEAD: Normocephalic/atraumatic EYES: EOMI/PERRL ENMT: Mucous membranes moist NECK: supple no meningeal signs SPINE:entire spine nontender CV: S1/S2 noted, no murmurs/rubs/gallops noted LUNGS: Lungs are clear to auscultation bilaterally, no apparent distress ABDOMEN: soft, nontender, no rebound or guarding GU:no cva tenderness NEURO: Pt is awake/alert, moves all extremitiesx4 there are no focal neurological signs. EXTREMITIES: pulses normal, full ROM SKIN: warm, color normal there is a 2  inch laceration on the top of the head without active bleeding. PSYCH: no abnormalities of mood noted       Assessment & Plan:  Patient has a laceration to the top of the scalp were checked on previous tetanus status and proceed with wound repair.

## 2013-03-19 NOTE — Patient Instructions (Signed)

## 2013-03-19 NOTE — Progress Notes (Signed)
Patient ID: Casey [name] Sr. MRN: [id number], DOB: Aug 23, 1955, 58 y.o. Date of Encounter: 03/19/2013, 6:07 PM   PROCEDURE NOTE: Verbal consent obtained. Sterile technique employed. Numbing: Anesthesia obtained with 2% lidocaine with epinephrine.   Cleansed with soap and water. Irrigated.  Wound explored, no deep structures involved, no foreign bodies.   Wound repaired with # 6 SI sutures using 4-0 prolene Hemostasis obtained. Wound cleansed and dressed.  Wound care instructions including precautions covered with patient. Handout given.  Anticipate suture removal in 7-10 days  Georgiann Mccoy, PA-C 03/19/2013 6:07 PM

## 2013-03-20 ENCOUNTER — Encounter: Payer: Self-pay | Admitting: Emergency Medicine

## 2013-04-13 ENCOUNTER — Other Ambulatory Visit: Payer: Self-pay | Admitting: Emergency Medicine

## 2013-04-18 ENCOUNTER — Other Ambulatory Visit: Payer: Self-pay | Admitting: Emergency Medicine

## 2013-04-18 NOTE — Telephone Encounter (Signed)
Ok to give pt RFs?

## 2013-04-24 ENCOUNTER — Other Ambulatory Visit: Payer: Self-pay | Admitting: Emergency Medicine

## 2013-04-27 ENCOUNTER — Other Ambulatory Visit: Payer: Self-pay

## 2013-04-27 MED ORDER — CLONAZEPAM 2 MG PO TABS: ORAL_TABLET | ORAL | Status: DC

## 2013-04-27 NOTE — Telephone Encounter (Signed)
Pharm reqs RF of clonazepam. Pended for 90 days since mail order. Please review.

## 2013-04-27 NOTE — Telephone Encounter (Signed)
faxed

## 2013-06-14 ENCOUNTER — Ambulatory Visit: Payer: 59

## 2013-06-17 ENCOUNTER — Ambulatory Visit: Payer: 59

## 2013-06-17 ENCOUNTER — Ambulatory Visit (INDEPENDENT_AMBULATORY_CARE_PROVIDER_SITE_OTHER): Payer: 59 | Admitting: Emergency Medicine

## 2013-06-17 VITALS — BP 130/80 | HR 90 | Temp 98.3°F | Resp 16 | Ht 65.0 in | Wt 194.4 lb

## 2013-06-17 DIAGNOSIS — R059 Cough, unspecified: Secondary | ICD-10-CM

## 2013-06-17 DIAGNOSIS — R05 Cough: Secondary | ICD-10-CM

## 2013-06-17 DIAGNOSIS — K219 Gastro-esophageal reflux disease without esophagitis: Secondary | ICD-10-CM

## 2013-06-17 DIAGNOSIS — F3289 Other specified depressive episodes: Secondary | ICD-10-CM

## 2013-06-17 DIAGNOSIS — F32A Depression, unspecified: Secondary | ICD-10-CM

## 2013-06-17 DIAGNOSIS — F329 Major depressive disorder, single episode, unspecified: Secondary | ICD-10-CM

## 2013-06-17 MED ORDER — PANTOPRAZOLE SODIUM 40 MG PO TBEC: 40.0000 mg | DELAYED_RELEASE_TABLET | Freq: Every day | ORAL | Status: DC

## 2013-06-17 NOTE — Progress Notes (Addendum)
Subjective:    Patient ID: Casey Lina., male    DOB: Jul 04, 1955, 58 y.o.   MRN: [id number]  HPI Chief Complaint  Patient presents with  . Cough  . Discuss medication    This chart was scribed for Arlyss Queen, MD by Thea Alken, ED Scribe. This patient was seen in room 5 and the patient's care was started at 3:42 PM.  HPI Comments: Casey Gallery. is a 58 y.o. male who presents to the [facility] complaining of a intermittent cough. He reports that the cough worsens at night and in the morning. Pt reports that he has tried OTC medication without relief to cough.  Pt reports that he no longer takes multivitamins because they cause kidney stones. Pt reports that he takes generic brand of zyrtec. Pt reports that he takes tramadol once a day one in the morning and one at night.  Pt is requesting medication for GERD he reports that the medication that he has been taking has gotten to expensive. Pt is requesting a medication that is affordable. Pt has h/o of insomnia. He denies being seen by neurology sleep study.  Pt reports suicidal ideation and severe depression; especially at the end of last year. He states it is multifactorial, mostly involving marital issues with his wife and insomnia. He is no longer experiencing suicidal ideation, but does still feel depressed. He is also experiencing decreased libido, which his wife wanted him to discuss during his visit today. He believes this decrease in libido is due to his depression. He does have a family history of depression and suicide, his brother committed suicide 22 years ago. There are also issues with his deceased brothers family. They state that he is saying bad things about his brother which the patient denies.   Past Medical History  Diagnosis Date  . Complication of anesthesia     nausea and vomiting  . PONV (postoperative nausea and vomiting)   . Hypertension   . Chronic kidney disease     multiple renal  stone  . GERD (gastroesophageal reflux disease)   . Arthritis     joints  . COPD (chronic obstructive pulmonary disease)   . Kidney stones    Allergies  Allergen Reactions  . Codeine Itching  . Hydrocodone Itching   Prior to Admission medications   Medication Sig Start Date End Date Taking? Authorizing Provider  cholecalciferol (VITAMIN D) 1000 UNITS tablet Take 1,000 Units by mouth every evening.    Yes Historical Provider, MD  clonazePAM (KLONOPIN) 2 MG tablet TAKE 2 TABLETS BY MOUTH AT BEDTIME AS NEEDED FOR ANXIETY 04/27/13  Yes [name], MD  cyclobenzaprine (FLEXERIL) 10 MG tablet Take 40 mg by mouth at bedtime. Take 4 pills at bedtime 12/30/11  Yes [name], MD  docusate sodium (COLACE) 100 MG capsule Take 200 mg by mouth 2 (two) times daily.   Yes Historical Provider, MD  HYDROmorphone (DILAUDID) 2 MG tablet Take 1 tablet (2 mg total) by mouth every 6 (six) hours as needed. Pain 04/07/12  Yes Irine Seal, MD  lisinopril-hydrochlorothiazide (PRINZIDE,ZESTORETIC) 20-12.5 MG per tablet Take 1 tablet by mouth  every evening   Yes [name], MD  Melatonin 3 MG TABS Take 1 tablet by mouth at bedtime.   Yes Historical Provider, MD  phenazopyridine (PYRIDIUM) 200 MG tablet Take 200 mg by mouth 3 (three) times daily.   Yes Historical Provider, MD  promethazine (PHENERGAN) 25  MG tablet Take 25 mg by mouth every 6 (six) hours as needed. nausea   Yes Historical Provider, MD  traMADol (ULTRAM) 50 MG tablet Take 1 tablet (50 mg total) by mouth 2 (two) times daily. 02/08/13  Yes [name], MD  traZODone (DESYREL) 100 MG tablet Take 2 tablets by mouth at  bedtime   Yes [name], MD   Review of Systems  Respiratory: Positive for cough.        Objective:   Physical Exam CONSTITUTIONAL: Well developed/well nourished HEAD: Normocephalic/atraumatic EYES: EOMI/PERRL ENMT: Mucous membranes moist NECK: supple no meningeal signs SPINE:entire spine nontender CV: S1/S2 noted, no  murmurs/rubs/gallops noted LUNGS: Lungs are clear to auscultation bilaterally, no apparent distress ABDOMEN: soft, nontender, no rebound or guarding GU:no cva tenderness NEURO: Pt is awake/alert, moves all extremitiesx4 EXTREMITIES: pulses normal, full ROM SKIN: warm, color normal PSYCH: no abnormalities of mood noted [facility] reading (PRIMARY) by  Dr.Armani Brar there is a poor inspiratory effort. No definite pneumonic infiltrates.      Assessment & Plan:  The patient having significant marital problems. I advised him to go to [facility] because he is interested in increasing base counseling service. This has suicidal thoughts but none at present. Referral made to Dr. Maple Hudson help with his sleep problem. I personally performed the services described in this documentation, which was scribed in my presence. The recorded information has been reviewed and is accurate.

## 2013-07-08 ENCOUNTER — Other Ambulatory Visit: Payer: Self-pay | Admitting: Emergency Medicine

## 2013-07-10 ENCOUNTER — Other Ambulatory Visit: Payer: Self-pay | Admitting: Emergency Medicine

## 2013-07-10 ENCOUNTER — Telehealth: Payer: Self-pay

## 2013-07-10 DIAGNOSIS — G4700 Insomnia, unspecified: Secondary | ICD-10-CM

## 2013-07-10 NOTE — Telephone Encounter (Signed)
Pt LM checking on status of referral for sleep study. I see that Dr Everlene Farrier wanted to refer to Dr Lenice Pressman for eval, but don't see the referral. I will put in order now. Notified pt of status.

## 2013-07-11 ENCOUNTER — Other Ambulatory Visit: Payer: Self-pay

## 2013-07-11 NOTE — Telephone Encounter (Signed)
Received request for refill. 

## 2013-07-13 ENCOUNTER — Other Ambulatory Visit: Payer: Self-pay | Admitting: Radiology

## 2013-07-13 ENCOUNTER — Other Ambulatory Visit: Payer: Self-pay

## 2013-07-13 ENCOUNTER — Telehealth: Payer: Self-pay | Admitting: *Deleted

## 2013-07-13 MED ORDER — TRAMADOL HCL 50 MG PO TABS: 50.0000 mg | ORAL_TABLET | Freq: Two times a day (BID) | ORAL | Status: DC

## 2013-07-13 NOTE — Telephone Encounter (Signed)
[facility] faxed request for RF

## 2013-07-13 NOTE — Telephone Encounter (Signed)
Faxed prescription to pharmacy, per Dr Joseph Art.

## 2013-07-13 NOTE — Telephone Encounter (Signed)
Called in but had already been faxed.

## 2013-07-17 ENCOUNTER — Ambulatory Visit (INDEPENDENT_AMBULATORY_CARE_PROVIDER_SITE_OTHER): Payer: 59 | Admitting: Neurology

## 2013-07-17 ENCOUNTER — Encounter: Payer: Self-pay | Admitting: Neurology

## 2013-07-17 VITALS — BP 131/81 | HR 85 | Resp 17 | Ht 66.5 in | Wt 198.0 lb

## 2013-07-17 DIAGNOSIS — G4701 Insomnia due to medical condition: Secondary | ICD-10-CM

## 2013-07-17 DIAGNOSIS — R5383 Other fatigue: Secondary | ICD-10-CM

## 2013-07-17 DIAGNOSIS — G4726 Circadian rhythm sleep disorder, shift work type: Secondary | ICD-10-CM

## 2013-07-17 DIAGNOSIS — R5381 Other malaise: Secondary | ICD-10-CM

## 2013-07-17 DIAGNOSIS — G2581 Restless legs syndrome: Secondary | ICD-10-CM | POA: Insufficient documentation

## 2013-07-17 HISTORY — DX: Circadian rhythm sleep disorder, shift work type: G47.26

## 2013-07-17 HISTORY — DX: Restless legs syndrome: G25.81

## 2013-07-17 NOTE — Patient Instructions (Signed)
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
## 2013-07-19 NOTE — Telephone Encounter (Signed)
I am sorry - refill for what?

## 2013-07-21 NOTE — Telephone Encounter (Signed)
Tramadol had been requested and sent to [facility] on 07/13/13. LMOM for pt to CB if there is also another medication he needs at this time.

## 2013-08-17 ENCOUNTER — Ambulatory Visit: Payer: 59 | Admitting: Neurology

## 2013-08-17 DIAGNOSIS — G4733 Obstructive sleep apnea (adult) (pediatric): Secondary | ICD-10-CM

## 2013-08-31 ENCOUNTER — Telehealth: Payer: Self-pay | Admitting: Neurology

## 2013-08-31 ENCOUNTER — Encounter: Payer: Self-pay | Admitting: *Deleted

## 2013-08-31 ENCOUNTER — Other Ambulatory Visit: Payer: Self-pay | Admitting: *Deleted

## 2013-08-31 DIAGNOSIS — R5383 Other fatigue: Secondary | ICD-10-CM

## 2013-08-31 DIAGNOSIS — G4733 Obstructive sleep apnea (adult) (pediatric): Secondary | ICD-10-CM

## 2013-08-31 DIAGNOSIS — R5381 Other malaise: Secondary | ICD-10-CM

## 2013-08-31 DIAGNOSIS — G4726 Circadian rhythm sleep disorder, shift work type: Secondary | ICD-10-CM

## 2013-08-31 DIAGNOSIS — G2581 Restless legs syndrome: Secondary | ICD-10-CM

## 2013-08-31 DIAGNOSIS — G4701 Insomnia due to medical condition: Principal | ICD-10-CM

## 2013-08-31 NOTE — Telephone Encounter (Signed)
I called and spoke with the patient about his recent sleep study results. I informed the patient that the study revealed mild obstructive sleep apnea, but severe hypoxemia and low oxygen saturation of 79%. Dr. Brett Fairy recommend treatment in the form of CPAP. This will require a repeat study for proper titration and mask fitting. Patient has been scheduled for August 18,2015 at 9:30 pm. I will send a copy of the report to Dr. Arlyss Queen and mail a copy to the patient.

## 2013-09-05 ENCOUNTER — Telehealth: Payer: Self-pay | Admitting: Neurology

## 2013-09-05 NOTE — Telephone Encounter (Signed)
Thank you for your help.

## 2013-09-05 NOTE — Telephone Encounter (Signed)
Please call patient and let him now he does need to complete the study because his oxygen level drops down to 79%. This is dangerous for him to do. It may be that he just requires oxygen to wear during the night but this need to be thoroughly evaluated before making a decision . He then needs to sit down and discuss the situation with Dr. Brett Fairy but we cannot continue him on all the medications he is on if he is dropping his oxygen to that level.

## 2013-09-05 NOTE — Telephone Encounter (Signed)
Please give him a RV appointment ASAP with me or Megan, i will come in to lead the discussion. He needs CPAP or oxygen, but can not be on neither/ CD

## 2013-09-05 NOTE — Telephone Encounter (Signed)
Thank you for your help with Mr. Fuhrmann.

## 2013-09-05 NOTE — Telephone Encounter (Signed)
Pt calls to cancel his CPAP titration study because he says that CPAP does not help him to sleep.  He has been struggling with insomnia for many years.  He is currently taking 4 flexeril, 2 trazodone, and 2 clonazepam every night and he still is unable to sleep.  I have explained to the patient that it was his O2 desaturation of 79% which was the driving factor that he proceed with CPAP therapy.  I have advised him that if he does not wish to proceed with a titration study, it is vital that he schedule another follow up appointment with Dr. Everlene Farrier to discuss an alternative course of action to address the hypoxemia.

## 2013-09-05 NOTE — Telephone Encounter (Signed)
Rv to discuss.

## 2013-09-05 NOTE — Telephone Encounter (Signed)
Called the patient and relayed feedback.  He has agreed to discuss the matter further in consultation with Dr. Brett Fairy tomorrow.

## 2013-09-06 ENCOUNTER — Ambulatory Visit (INDEPENDENT_AMBULATORY_CARE_PROVIDER_SITE_OTHER): Payer: 59 | Admitting: Neurology

## 2013-09-06 ENCOUNTER — Encounter: Payer: Self-pay | Admitting: Neurology

## 2013-09-06 VITALS — BP 113/76 | HR 96 | Resp 16 | Ht 64.25 in | Wt 204.0 lb

## 2013-09-06 DIAGNOSIS — G4733 Obstructive sleep apnea (adult) (pediatric): Secondary | ICD-10-CM

## 2013-09-06 DIAGNOSIS — R0902 Hypoxemia: Secondary | ICD-10-CM

## 2013-09-06 NOTE — Progress Notes (Signed)
[facility]  Provider:  Larey Seat, [place]  Referring Provider: [name], MD Primary Care Physician:  [name], MD  Chief Complaint  Patient presents with  . Follow-up    Room 10  . 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 He had a normal pre-study blood pressure. He was diagnosed with 70 hypotony and is with an AHI of 11.8 and an RDI of 14.2, his REM AHI was 52.4. Supine AHI was 80. Please note that the patient only slept for less than 1% of the total sleep time in supine position. Oxygen desaturation was 79% with 228 minutes of desaturation time total. There were no periodic limb movements noted,  heart rate remained in normal sinus rhythm.  The study showed only mild obstructive sleep apnea but severe oxygen desaturation lasting for prolonged periods of time no snoring was noted and the of the patient several times by sleeping in supine position. Central sleep apnea was not noted. Sleep efficiency was 90% following a normal sleep latency of 20.5 minutes. Sleep architecture was essentially normal was at night.  My recommendations are to try a CPAP therapy because the patient has mild apnea in combination with loud snoring which cost of all sorts, and all oxygen levels for prolonged periods of time. A dental device or ENT surgery would not address the low oxygen levels. Casey [name] is surprised that he was told that he slept for over 6 hours this was fairly normal sleep architecture.  He also states that he was tossing and turning throughout the night and that this is not reflected in a rehabilitation program. What is  however evident is that he has quite significant oxygen saturations beginning at about 1:30 AM and lasting until about 4 AM. These are  disproportionately correlated with REM sleep periods.  His perception of his sleep is completely opposite to the documented sleep duration, pattern and he denies having any snoring at home. He sleeps on his side at home, with a pillow between his knees. He is not happy to resume CPAP therapy as  " will do nothing for my insomnia" his machine was prescribed over 6 years ago.  He has no claustrophobia and no sound sensitivity.  I recommend strongly to resume CPAP and titrate to the best pressure.          Seen first on 07-17-13  referral from Dr. Everlene Farrier for a sleep evaluation,  Casey [name] was seen by Dr. Everlene Farrier 0.06-17-13 with a cough.  He had already tried medication without relief,  over-the-counter medications mostly. The patient also has a history of kidney stones which restrict some of the medication choices he can take.  He has a history of insomnia and had  been seen for a sleep study before, about 15 years ago. He has used sleep aids ever since.  Apnea was found initially and was on a CPAP for about 4 years , but couldn't sleep better, longer, or deeper. 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 He has been depressed and has a family history of depression, his brother committed suicide. His mother has insomnia and depression. He had at times suicidal ideation, thus making the prescription of a sleep aid difficult.   Dr. Casimiro Needle has treated him for depression, and prescribed medication that allows the patient to initiate sleep but not maintain sleep. He takes his sleep medication 30 minutes prior to bed time.  He takes Trazodone, Clonazepam and 40 mg Flexaril at night. His bed time is between 20.30 to 21.00 hours, He reports dreaming, not  nightmares , no parasomnia or REM BD reported. He shares the bedroom with his wife , who talks in her sleep.  He prefers to sleep in bed, but sometimes falls asleep in a recliner - he feels this is the best sleep, in front of the TV.  He doesn't take naps, knowing they impair his nocturnal sleep. He rises at 5 .30 AM , he spontanously wakes, has an alarm for back up but relies on his "inner clock".  He seldom has to go to the bathroom, he sometimes wakes  with a dry mouth, no morning headches reported. His overall nightly sleep time is 5.5 hours on average.   No family history of apnea, sleep disorders of childhood , arousals disorders.     Review of Systems: Out of a complete 14 system review, the patient complains of only the following symptoms, and all other reviewed systems are negative. Epworth 1 points, FSS 9 points.   History   Social History  . Marital Status: Married    Spouse Name: [name]    Number of Children: 2  . Years of Education: 12+    Occupational History  . MANAGER    Social History Main Topics  . Smoking status: Never Smoker   . Smokeless tobacco: Never Used  . Alcohol Use: No  . Drug Use: No  . Sexual Activity: Yes   Other Topics Concern  . 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   Patient has two children.   Patient has 5 years of [employer].   Patient is right-handed.   Patient drinks very little caffeine.    Family History  Problem Relation Age of Onset  . Cancer Mother   . Cancer Father   . Diabetes Brother     Past Medical History  Diagnosis Date  . Complication of anesthesia     nausea and vomiting  . PONV (postoperative nausea and vomiting)   . Hypertension   . Chronic kidney disease     multiple renal stone  . GERD (gastroesophageal reflux disease)   . Arthritis     joints  . COPD (chronic obstructive pulmonary disease)   . Kidney stones   . Insomnia secondary to  restless leg syndrome 07/17/2013  . Circadian rhythm sleep disorder, shift work type 07/17/2013    Past Surgical History  Procedure Laterality Date  . Tonsillectomy    . Varicellectomy    . Meniscul    .  Acl repair left [name]    . Rotator cuff repair      left shoulder   . Ligaments repair left wrist     . Fusion great toe right foot    . Tendons cut on left heel    . Cholecystectomy    . Stone extractions and urethral stents      multiple  . Cardiac catheterization  2005    negative   . Skin cancer removed from left cheek    . Cystoscopy/retrograde/ureteroscopy/stone extraction with basket  04/07/2012    Procedure: CYSTOSCOPY/RETROGRADE/URETEROSCOPY/STONE EXTRACTION WITH BASKET;  Surgeon: Malka So, MD;  Location: [facility];  Service: Urology;  Laterality: Bilateral;  cystoscopy, bilateral stent removal, bilateral ureteroscopy with bilateral stone extraction and insertion of right ureteral stent    Current Outpatient Prescriptions  Medication Sig Dispense Refill  . B Complex Vitamins (VITAMIN-B COMPLEX PO) Take 1 tablet by mouth daily.      . Calcium-Magnesium-Vitamin D (CALCIUM MAGNESIUM PO) Take 1 tablet by mouth daily.      . cetirizine (ZYRTEC) 10 MG tablet Take 10 mg by mouth daily.      . Cholecalciferol (VITAMIN D-3) 5000 UNITS TABS Take 1 tablet by mouth daily.      . clonazePAM (KLONOPIN) 2 MG tablet TAKE 2 TABLETS BY MOUTH AT BEDTIME AS NEEDED FOR ANXIETY  180 tablet  1  . cyclobenzaprine (FLEXERIL) 10 MG tablet Take 4 tablets by mouth at  bedtime  360 tablet  0  . lisinopril-hydrochlorothiazide (PRINZIDE,ZESTORETIC) 20-12.5 MG per tablet Take 1 tablet by mouth  every evening  90 tablet  0  . Omega 3-6-9 Fatty Acids (OMEGA 3-6-9 COMPLEX PO) Take 1 tablet by mouth daily.      . pantoprazole (PROTONIX) 40 MG tablet Take 1 tablet (40 mg total) by mouth daily.  30 tablet  11  . traMADol (ULTRAM) 50 MG tablet Take 50 mg by mouth 2 (two) times daily. 2 tablets      . traZODone  (DESYREL) 100 MG tablet Take 2 tablets by mouth at  bedtime  180 tablet  0   No current facility-administered medications for this visit.    Allergies as of 09/06/2013 - Review Complete 09/06/2013  Allergen Reaction Noted  . Codeine Itching 04/26/2011  . Hydrocodone Itching 04/26/2011    Vitals: BP 113/76  Pulse 96  Resp 16  Ht 5' 4.25" (1.632 m)  Wt 204 lb (92.534 kg)  BMI 34.74 kg/m2 Last Weight:  Wt Readings from Last 1 Encounters:  09/06/13 204 lb (92.534 kg)   Last Height:   Ht Readings from Last 1 Encounters:  09/06/13 5' 4.25" (1.632 m)    Physical exam:  General: The patient is awake, alert and appears not in acute distress. The patient is well groomed. Head: Normocephalic, atraumatic. Neck is supple. Mallampati 4 , neck [id number]:48.8, TMJ click but no pain. No dysphagia, nasal passage clear.  Cardiovascular:  Regular rate and rhythm, without  murmurs or carotid bruit, and without distended neck veins. Respiratory: Lungs are clear to auscultation. Skin:  Without evidence of edema, or rash Trunk: BMI is elevated and patient  has normal posture.  Neurologic exam : The patient is awake and alert, oriented to place and time.  Memory subjective described as intact.  There is a normal attention span & concentration ability. Speech is fluent without dysarthria, dysphonia or aphasia.  Mood and affect are slightly anxious, cooperative , friendly . Cranial nerves: Pupils are equal and  briskly reactive to light.  Funduscopic exam without  evidence of pallor or edema. Extraocular movements  in vertical and horizontal planes intact and without nystagmus. Visual fields by finger perimetry are intact. Hearing to finger rub intact.  Facial sensation intact to fine touch. Facial motor strength is symmetric and tongue and uvula move midline.  Motor exam:  Normal tone ,muscle bulk and symmetric  strength in all extremities.  Sensory:  Fine touch, pinprick and vibration were  tested in all extremities. Proprioception was  normal.  Coordination: Rapid alternating movements in the fingers/hands is tested and normal.  Finger-to-nose maneuver tested and normal without evidence of ataxia, dysmetria . Mild bilateral tremor, with action, not at rest.   Gait and station: Patient walks without assistive device .Strength within normal limits. Stance is stable and normal. Tandem gait normal.  Deep tendon reflexes: in the  upper and lower extremities are symmetric and intact. Babinski maneuver downgoing.   Assessment:  After physical and neurologic examination, review of laboratory studies, imaging, neurophysiology testing and pre-existing records, assessment is  1) patient may have more psychological reasons /causes for his insomnia, but it begun with shift work 3 decades ago.  I would like to treat REM dependent  OSA with hypoxemia  as his main physiological sleep disorder .   Plan:  Treatment plan and additional workup :  1)  titration to CPAP, nasal or FFM ( ex fireman ), please titrate gently anf allow for his sleep aids to be taken in lab. He likes to sleep by 9 PM and may be could be the first patient to arrive?  2) depression treatmetn per Dr Plovsky/ Dr. Everlene Farrier.

## 2013-09-25 ENCOUNTER — Ambulatory Visit (INDEPENDENT_AMBULATORY_CARE_PROVIDER_SITE_OTHER): Payer: 59 | Admitting: Neurology

## 2013-09-25 DIAGNOSIS — G4733 Obstructive sleep apnea (adult) (pediatric): Secondary | ICD-10-CM

## 2013-09-25 DIAGNOSIS — R0902 Hypoxemia: Secondary | ICD-10-CM

## 2013-10-18 ENCOUNTER — Telehealth: Payer: Self-pay | Admitting: Neurology

## 2013-10-18 ENCOUNTER — Encounter: Payer: Self-pay | Admitting: *Deleted

## 2013-10-18 NOTE — Telephone Encounter (Signed)
I called  and left a message for the patient about his recent CPAP titration study results. I informed the patient that he did well on CPAP during the night of his study with significant improvement of his respiratory events. I will fax a copy of the report to Dr. Remo Lipps Daub's office and mail a copy to the patient.

## 2013-10-19 ENCOUNTER — Other Ambulatory Visit: Payer: Self-pay | Admitting: *Deleted

## 2013-10-19 DIAGNOSIS — G4733 Obstructive sleep apnea (adult) (pediatric): Secondary | ICD-10-CM

## 2013-10-22 ENCOUNTER — Other Ambulatory Visit: Payer: Self-pay | Admitting: Emergency Medicine

## 2013-10-23 NOTE — Telephone Encounter (Signed)
Dr Everlene Farrier, you saw pt for this med at last CPE in Jan. Pt has appt for 03/27/14. Do you want to RF until then, or have pt come back for eval now?

## 2013-10-24 ENCOUNTER — Other Ambulatory Visit: Payer: Self-pay

## 2013-10-24 NOTE — Telephone Encounter (Signed)
Dr Everlene Farrier, [facility] faxed req for RF of tramadol. Pended BUT please check sig, says "take 1 tab....Marland Kitchen" then "2 tabs." please clarify directions and  Quantity.

## 2013-10-24 NOTE — Telephone Encounter (Signed)
Also received req for clonazepam. Pended also. Pt has appt sch w/you for 03/27/14.

## 2013-10-25 MED ORDER — CLONAZEPAM 2 MG PO TABS: ORAL_TABLET | ORAL | Status: DC

## 2013-10-25 MED ORDER — TRAMADOL HCL 50 MG PO TABS
50.0000 mg | ORAL_TABLET | Freq: Two times a day (BID) | ORAL | Status: DC
Start: 2013-10-25 — End: 2014-01-24

## 2013-10-25 NOTE — Telephone Encounter (Signed)
Verified w/Dr Everlene Farrier quantity he wants to send in for RFs and printed/had him sign, then faxed to [facility].  SCHEDULING:  Dr Everlene Farrier would like to see pt before these 3 mos RFs run out, so by around mid Nov. Can you contact pt and get his appt moved up ( currently 03/27/14).

## 2013-10-25 NOTE — Telephone Encounter (Signed)
The prescription  should say one tablet twice a day.

## 2013-11-07 ENCOUNTER — Encounter: Payer: Self-pay | Admitting: Neurology

## 2013-11-16 ENCOUNTER — Other Ambulatory Visit: Payer: Self-pay | Admitting: Emergency Medicine

## 2013-12-01 ENCOUNTER — Encounter: Payer: Self-pay | Admitting: Neurology

## 2013-12-06 ENCOUNTER — Encounter: Payer: Self-pay | Admitting: Neurology

## 2013-12-06 ENCOUNTER — Ambulatory Visit (INDEPENDENT_AMBULATORY_CARE_PROVIDER_SITE_OTHER): Payer: 59 | Admitting: Neurology

## 2013-12-06 VITALS — BP 112/77 | HR 97 | Resp 16 | Ht 66.0 in | Wt 209.0 lb

## 2013-12-06 DIAGNOSIS — G4730 Sleep apnea, unspecified: Secondary | ICD-10-CM

## 2013-12-06 DIAGNOSIS — G4700 Insomnia, unspecified: Secondary | ICD-10-CM | POA: Insufficient documentation

## 2013-12-06 DIAGNOSIS — G4733 Obstructive sleep apnea (adult) (pediatric): Secondary | ICD-10-CM | POA: Insufficient documentation

## 2013-12-06 DIAGNOSIS — F5102 Adjustment insomnia: Secondary | ICD-10-CM

## 2013-12-06 DIAGNOSIS — Z9989 Dependence on other enabling machines and devices: Secondary | ICD-10-CM

## 2013-12-06 MED ORDER — SUVOREXANT 15 MG PO TABS: 15.0000 mg | ORAL_TABLET | Freq: Every evening | ORAL | Status: DC

## 2013-12-06 NOTE — Progress Notes (Signed)
[facility]  Provider:  Larey Seat, [place]  Referring Provider: [name], MD Primary Care Physician:  [name], MD  Chief Complaint  Patient presents with  . Osa    f/u, Rm 11    HPI:  Casey [name]. is a 58 y.o. former Agricultural consultant, a  right handed , caucasian  male , who is seen herein a revisit today,  Casey [name] using CPAP at a pressure of 9 cm water with 3 cm water T. I he has been using on average CPAP for 5 hours and 3 minutes at night his compliance is 90% at 27 of 30 days. 4 or 4 hours of use his compliance is 67%, is residual AHI is 1.1 his air leaks are negligible. He also underwent a pulse oximetry to confirm that he is not longer hypoxemic via on CPAP.  The total time of desaturation during the night of 11-07-13 was 8 minutes the at this conference at the patient is not in need of additional oxygen supplement. The patient reports that he has an Epworth sleepiness score of 3 points and that he is still not feeling that he sleeps sounder or deeper or old has a more restorative quality to her sleep since using CPAP.   We will discuss today treatment with sleep aids. [name] also just begun working in a new employment with regular hours from 8-6. This undoubtedly will be of benefit to  his sleep cycle.       Last visit  Casey [name] underwent a polysomnography on 08-17-13 after endorsing the Epworth Sleepiness Scale at one point and the depression questionnaire at 10 points. He had a normal pre-study blood pressure. He was diagnosed with 70 hypotony and is with an AHI of 11.8 and an RDI of 14.2, his REM AHI was 52.4. Supine AHI was 80. Please note that the patient only slept for less than 1% of the total sleep time in supine position.  Oxygen desaturation was 79% with 228 minutes of desaturation time total. There were no periodic limb movements noted,   heart rate remained in normal sinus rhythm.  The study showed only mild obstructive sleep apnea but severe oxygen desaturation lasting for prolonged periods of time no snoring was noted and the of the patient several times by sleeping in supine position. Central sleep apnea was not noted. Sleep efficiency was 90% following a normal sleep latency of 20.5 minutes. Sleep architecture was essentially normal was at night.  My recommendations are to try a CPAP therapy because the patient has mild apnea in combination with loud snoring which cost of all sorts, and all oxygen levels for prolonged periods of time. A dental device or ENT surgery would not address the low oxygen levels. Casey [name] is surprised that he was told that he slept for over 6 hours this was fairly normal sleep architecture.  He also states that he was tossing and turning throughout the night and that this is not reflected in a rehabilitation program.  What is however evident is that he has quite significant oxygen saturations beginning at about 1:30 AM and lasting until about 4 AM. These are  disproportionately correlated with REM sleep periods.  His perception of his sleep is completely opposite to the documented sleep duration, pattern and he denies having any snoring at home. He sleeps on his side at home, with a pillow between his knees. He is not happy to resume CPAP therapy as  " will do nothing for my insomnia" his machine was prescribed over 6 years ago.  He has no claustrophobia and no sound sensitivity.  I recommend strongly to resume CPAP and titrate to the best pressure.          Seen first on 07-17-13  referral from Dr. Everlene Farrier for a sleep evaluation,  Casey [name] was seen by Dr. Everlene Farrier 0.06-17-13 with a cough.  He had already tried medication without relief,  over-the-counter medications mostly. The patient also has a history of kidney stones which restrict some of the medication choices he can take.  He has a history of insomnia  and had been seen for a sleep study before, about 15 years ago. He has used sleep aids ever since.  Apnea was found initially and was on a CPAP for about 4 years , but couldn't sleep better, longer, or deeper. 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 He has been depressed and has a family history of depression, his brother committed suicide. His mother has insomnia and depression. He had at times suicidal ideation, thus making the prescription of a sleep aid difficult.   Dr. Casimiro Needle has treated him for depression, and prescribed medication that allows the patient to initiate sleep but not maintain sleep. He takes his sleep medication 30 minutes prior to bed time.  He takes Trazodone, Clonazepam and 40 mg Flexaril at night. His bed time is between 20.30 to 21.00 hours, He reports dreaming, not nightmares , no parasomnia or REM BD reported. He shares the bedroom with his wife , who talks in her sleep.  He prefers to sleep in bed, but sometimes falls asleep in a recliner - he feels this is the best sleep, in front of the TV.  He doesn't take naps, knowing they impair his nocturnal sleep. He rises at 5 .30 AM , he spontanously wakes, has an alarm for back up but relies on his "inner clock".  He seldom has to go to the bathroom, he sometimes wakes  with a dry mouth, no morning headches reported. His overall nightly sleep time is 5.5 hours on average.   No family history of apnea, sleep disorders of childhood , arousals disorders.     Review of Systems: Out of a complete 14 system review, the patient complains of only the following symptoms, and all other reviewed systems are negative. Epworth 1 points, FSS 9 points.   History   Social History  . Marital Status: Married    Spouse Name: [name]    Number of Children: 2  . Years of Education: 12+    Occupational History  .  MANAGER    Social History Main Topics  . Smoking status: Never Smoker   . Smokeless tobacco: Never Used  . Alcohol Use: No  . Drug Use: No  . Sexual Activity: Yes   Other Topics Concern  . 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   Patient has two children.   Patient has 5 years of [employer].   Patient is right-handed.   Patient drinks very little caffeine.    Family History  Problem Relation Age of Onset  . Cancer Mother   . Cancer Father   . Diabetes Brother     Past Medical History  Diagnosis Date  . Complication of anesthesia     nausea and vomiting  . PONV (postoperative nausea and vomiting)   . Hypertension   . Chronic kidney disease     multiple renal stone  . GERD (gastroesophageal reflux disease)   . Arthritis     joints  . COPD (chronic obstructive pulmonary disease)   . Kidney stones   . Insomnia secondary to restless leg syndrome 07/17/2013  . Circadian rhythm sleep disorder, shift work type 07/17/2013  . OSA on CPAP     Past Surgical History  Procedure Laterality Date  . Tonsillectomy    . Varicellectomy    . Meniscul    . Acl repair left knee    . Rotator cuff repair      left shoulder   . Ligaments repair left wrist     . Fusion great toe right foot    . Tendons cut on left heel    . Cholecystectomy    . Stone extractions and urethral stents      multiple  . Cardiac catheterization  2005    negative   . Skin cancer removed from left cheek    . Cystoscopy/retrograde/ureteroscopy/stone extraction with basket  04/07/2012    Procedure: CYSTOSCOPY/RETROGRADE/URETEROSCOPY/STONE EXTRACTION WITH BASKET;  Surgeon: Malka So, MD;  Location: [facility];  Service: Urology;  Laterality: Bilateral;  cystoscopy, bilateral stent removal, bilateral ureteroscopy with bilateral stone extraction and insertion of right ureteral stent    Current Outpatient Prescriptions  Medication Sig Dispense  Refill  . B Complex Vitamins (VITAMIN-B COMPLEX PO) Take 1 tablet by mouth daily.      . Calcium-Magnesium-Vitamin D (CALCIUM MAGNESIUM PO) Take 1 tablet by mouth daily.      . cetirizine (ZYRTEC) 10 MG tablet Take 10 mg by mouth daily.      . Cholecalciferol (VITAMIN D-3) 5000 UNITS TABS Take 1 tablet by mouth daily.      . clonazePAM (KLONOPIN) 2 MG tablet TAKE 2 TABLETS BY MOUTH AT BEDTIME AS NEEDED FOR ANXIETY  60 tablet  2  . cyclobenzaprine (FLEXERIL) 10 MG tablet Take 4 tablets by mouth at  bedtime  360 tablet  1  . lisinopril-hydrochlorothiazide (PRINZIDE,ZESTORETIC) 20-12.5 MG per tablet Take 1 tablet by mouth  every evening  30 tablet  5  . Omega 3-6-9 Fatty Acids (OMEGA 3-6-9 COMPLEX PO) Take 1 tablet by mouth daily.      . pantoprazole (PROTONIX) 40 MG tablet Take 1 tablet (40 mg total) by mouth daily.  30 tablet  11  . traMADol (ULTRAM) 50 MG tablet Take 1 tablet (50 mg total) by mouth 2 (two) times daily.  60 tablet  2  . traZODone (DESYREL) 100 MG tablet Take 2 tablets by mouth at  bedtime  30 tablet  5   No current facility-administered medications for this visit.    Allergies as of 12/06/2013 - Review Complete 12/06/2013  Allergen Reaction Noted  . Codeine Itching 04/26/2011  . Hydrocodone Itching 04/26/2011    Vitals: BP 112/77  Pulse 97  Resp 16  Ht 5\' 6"  (1.676 m)  Wt 209 lb (94.802 kg)  BMI 33.75 kg/m2 Last Weight:  Wt Readings from Last 1 Encounters:  12/06/13 209 lb (94.802 kg)   Last Height:   Ht Readings from Last 1 Encounters:  12/06/13 5\' 6"  (1.676 m)    Physical exam:  General: The patient is awake, alert and appears not in acute distress. The patient is well groomed. Head: Normocephalic, atraumatic. Neck is supple. Mallampati 4 , neck [id number]:87.5,  TMJ click but no pain. No dysphagia, nasal passage clear.  Cardiovascular:  Regular rate and rhythm, without  murmurs or carotid bruit, and without distended neck veins. Respiratory: Lungs are  clear to auscultation. Skin:  Without evidence of edema, or rash Trunk: BMI is elevated and patient  has normal posture.  Neurologic exam : The patient is awake and alert, oriented to place and time.  Memory subjective described as intact.  There is a normal attention span & concentration ability. Speech is fluent without dysarthria, dysphonia or aphasia.  Mood and affect are slightly anxious, cooperative , friendly . Cranial nerves: Pupils are equal and briskly reactive to light.  Funduscopic exam without  evidence of pallor or edema. Extraocular movements  in vertical and horizontal planes intact and without nystagmus. Visual fields by finger perimetry are intact. Hearing to finger rub intact.  Facial sensation intact to fine touch. Facial motor strength is symmetric and tongue and uvula move midline.  Motor exam:  Normal tone ,muscle bulk and symmetric  strength in all extremities.  Sensory:  Fine touch, pinprick and vibration were tested in all extremities. Proprioception was  normal.  Coordination: Rapid alternating movements in the fingers/hands is tested and normal.  Finger-to-nose maneuver tested and normal without evidence of ataxia, dysmetria . Mild bilateral tremor, with action, not at rest.   Gait and station: Patient walks without assistive device .Strength within normal limits. Stance is stable and normal. Tandem gait normal.  Deep tendon reflexes: in the  upper and lower extremities are symmetric and intact. Babinski maneuver downgoing.   Assessment:  After physical and neurologic examination, review of laboratory studies, imaging, neurophysiology testing and pre-existing records, assessment is  1) patient may have more psychological reasons /causes for his insomnia, but it begun with shift work 3 decades ago.  CPAP successfully  Treated the  REM dependent OSA with hypoxemia as his main physiological sleep disorder .   He has been on CPAP for over 30 days and his AHI is  now 1.1 , but he sleeps "neither deeper nor more sound"- depression related ? .   Plan:  Treatment plan and additional workup : Insomnia treatment with Belsomra , not addictive , not respiratory suppressive. He will take the samples at 10 mg and 15 mg home and try this medication, intake time is 30 minutes prior to bedtime.

## 2013-12-06 NOTE — Patient Instructions (Signed)
belsomraSuvorexant oral tablets What is this medicine? SUVOREXANT (su-vor-EX-ant) is used to treat insomnia. This medicine helps you to fall asleep and sleep through the night. This medicine may be used for other purposes; ask your health care provider or pharmacist if you have questions. COMMON BRAND NAME(S): Belsomra What should I tell my health care provider before I take this medicine? They need to know if you have any of these conditions: -depression -history of a drug or alcohol abuse problem -history of daytime sleepiness -history of sudden onset of muscle weakness (cataplexy) -liver disease -lung or breathing disease -narcolepsy -suicidal thoughts, plans, or attempt; a previous suicide attempt by you or a family member -an unusual or allergic reaction to suvorexant, other medicines, foods, dyes, or preservatives -pregnant or trying to get pregnant -breast-feeding How should I use this medicine? Take this medicine by mouth within 30 minutes of going to bed. Do not take it unless you are able to stay in bed a full night before you must be active again. Follow the directions on the prescription label. For best results, it is better to take this medicine on an empty stomach. Do not take your medicine more often than directed. Do not stop taking this medicine on your own. Always follow your doctor or health care professional's advice. A special MedGuide will be given to you by the pharmacist with each prescription and refill. Be sure to read this information carefully each time. Talk to your pediatrician regarding the use of this medicine in children. Special care may be needed. Overdosage: If you think you've taken too much of this medicine contact a poison control center or emergency room at once. Overdosage: If you think you have taken too much of this medicine contact a poison control center or emergency room at once. NOTE: This medicine is only for you. Do not share this medicine with  others. What if I miss a dose? This medicine should only be taken immediately before going to sleep. Do not take double or extra doses. What may interact with this medicine? -alcohol -antiviral medicines for HIV or AIDS -aprepitant -carbamazepine -certain antibiotics like ciprofloxacin, clarithromycin, erythromycin, telithromycin -certain medicines for depression or psychotic disturbances -certain medicines for fungal infections like ketoconazole, posaconazole, fluconazole, or itraconazole -conivaptan -digoxin -diltiazem -grapefruit juice -imatinib -medicines for anxiety or sleep -phenytoin -rifampin -verapamil This list may not describe all possible interactions. Give your health care provider a list of all the medicines, herbs, non-prescription drugs, or dietary supplements you use. Also tell them if you smoke, drink alcohol, or use illegal drugs. Some items may interact with your medicine. What should I watch for while using this medicine? Visit your doctor or health care professional for regular checks on your progress. Keep a regular sleep schedule by going to bed at about the same time each night. Avoid caffeine-containing drinks in the evening hours. When sleep medicines are used every night for more than a few weeks, they may stop working. Do not increase the dose on your own. Talk to your doctor if your insomnia worsens or is not better within 7 to 10 days. You may not be able to remember things that you do in the hours after you take this medicine. Some people have reported driving, making phone calls, or preparing and eating food while asleep after taking sleep medicine. Take this medicine right before going to sleep. Tell your doctor if you are having any problems with your memory. Do not take this medicine unless you are  able to stay in bed for a full night (7 to 8 hours) before you must be active again and do not drive or perform other activities requiring full alertness within  8 hours of a dose. Do not drive, use machinery, or do anything that needs mental alertness the day after you take the 20 mg dose of this medicine. The use of lower doses (10 mg) also has the potential to cause driving impairment the next day. You may have a decrease in mental alertness the day after use, even if you feel that you are fully awake. Tell your doctor if you will need to perform activities requiring full alertness, such as driving, the next day. You may get drowsy or dizzy. Do not stand or sit up quickly, especially if you are an older patient. This reduces the risk of dizzy or fainting spells. Alcohol may interfere with the effect of this medicine. Avoid alcoholic drinks. Do not use this medicine if you have had alcohol that evening or before bed. If you or your family notice any changes in your behavior, or if you have any unusual or disturbing thoughts such as depression or suicidal thoughts, call your doctor right away. What side effects may I notice from receiving this medicine? Side effects that you should report to your doctor or health care professional as soon as possible: -allergic reactions like skin rash, itching or hives, swelling of the face, lips, or tongue -confusion -depressed mood -feeling faint or lightheaded, falls -hallucinations -inability to move or speak for up to several minutes while you are going to sleep or waking up -memory loss -periods of leg weakness lasting from seconds to a few minutes -problems with balance, speaking, walking -restlessness, excitability, or feelings of agitation -unusual activities while asleep like driving, eating, making phone calls Side effects that usually do not require medical attention (Report these to your doctor or health care professional if they continue or are bothersome.): -daytime drowsiness -diarrhea -dizziness -headache This list may not describe all possible side effects. Call your doctor for medical advice about  side effects. You may report side effects to FDA at 1-800-FDA-1088. Where should I keep my medicine? Keep out of the reach of children. This medicine can be abused. Keep your medicine in a safe place to protect it from theft. Do not share this medicine with anyone. Selling or giving away this medicine is dangerous and against the law. Store at room temperature between 15 and 30 degrees C (59 and 86 degrees F). Throw away any unused medicine after the expiration date. NOTE: This sheet is a summary. It may not cover all possible information. If you have questions about this medicine, talk to your doctor, pharmacist, or health care provider.  2015, Elsevier/Gold Standard. (2012-10-19 19:37:59)

## 2013-12-15 ENCOUNTER — Other Ambulatory Visit: Payer: Self-pay

## 2013-12-19 ENCOUNTER — Other Ambulatory Visit: Payer: Self-pay

## 2013-12-19 ENCOUNTER — Other Ambulatory Visit: Payer: Self-pay | Admitting: Neurology

## 2013-12-19 DIAGNOSIS — F5102 Adjustment insomnia: Secondary | ICD-10-CM

## 2013-12-19 DIAGNOSIS — Z9989 Dependence on other enabling machines and devices: Secondary | ICD-10-CM

## 2013-12-19 DIAGNOSIS — G4733 Obstructive sleep apnea (adult) (pediatric): Secondary | ICD-10-CM

## 2013-12-19 DIAGNOSIS — G4700 Insomnia, unspecified: Secondary | ICD-10-CM

## 2013-12-19 DIAGNOSIS — G4730 Sleep apnea, unspecified: Principal | ICD-10-CM

## 2013-12-19 MED ORDER — SUVOREXANT 15 MG PO TABS: 15.0000 mg | ORAL_TABLET | Freq: Every evening | ORAL | Status: DC

## 2013-12-19 NOTE — Telephone Encounter (Signed)
I called back.  Unfortunately, each plan is different, and they determine the cost/coverage.  Patient does not wish to contact his ins to find out coverage and would like to proceed with getting Rx for Belsomra 15mg .  Request forwarded to provider for approval.

## 2013-12-19 NOTE — Telephone Encounter (Signed)
Patient calling to state that he was given samples of Belsomra and he found that the 15 mg was the one that worked the best for him, wants to know how much his insurance will cover before he requests a script, please return call and advise.

## 2013-12-19 NOTE — Telephone Encounter (Signed)
Patient needs refills on the following: flexeril and klonopin. [facility] [address]. Please return call and advise. CB # [phone]

## 2013-12-20 MED ORDER — CLONAZEPAM 2 MG PO TABS: ORAL_TABLET | ORAL | Status: DC

## 2013-12-20 MED ORDER — CYCLOBENZAPRINE HCL 10 MG PO TABS: ORAL_TABLET | ORAL | Status: DC

## 2013-12-20 NOTE — Telephone Encounter (Signed)
Rx signed and faxed.

## 2013-12-21 MED ORDER — CYCLOBENZAPRINE HCL 10 MG PO TABS: ORAL_TABLET | ORAL | Status: DC

## 2013-12-21 NOTE — Telephone Encounter (Signed)
Called in [facility] to [facility] req[facility] and resent flexeril which had orig been sent to [facility] in error. Called pt to notify and called [facility] to cancel flexeril.

## 2013-12-21 NOTE — Addendum Note (Signed)
Addended by: Elwyn Reach A on: 12/21/2013 10:14 AM   Modules accepted: Orders

## 2014-01-24 ENCOUNTER — Telehealth: Payer: Self-pay

## 2014-01-24 NOTE — Telephone Encounter (Signed)
[facility] reqs RF of Tramadol. Dr Everlene Farrier, pt is overdue for f/up, but does have Appt sch for 03/27/13. Do you want to RF until then?

## 2014-01-24 NOTE — Telephone Encounter (Signed)
Okay to refill his medications since he has a scheduled appointment

## 2014-01-26 MED ORDER — TRAMADOL HCL 50 MG PO TABS: 50.0000 mg | ORAL_TABLET | Freq: Two times a day (BID) | ORAL | Status: DC

## 2014-01-26 NOTE — Telephone Encounter (Signed)
Printed and put in Dr Perfecto Kingdom box for signature.

## 2014-01-29 NOTE — Telephone Encounter (Signed)
Rx was found in the stack of Rxs ready to be sent. Since Casey [name] had already called it in, I shredded the printed copy.

## 2014-01-29 NOTE — Telephone Encounter (Signed)
Rx did not make it to [facility]. Called in refill.

## 2014-03-05 ENCOUNTER — Other Ambulatory Visit: Payer: Self-pay | Admitting: Physician Assistant

## 2014-03-27 ENCOUNTER — Encounter: Payer: 59 | Admitting: Emergency Medicine

## 2014-04-02 ENCOUNTER — Encounter: Payer: Self-pay | Admitting: Gastroenterology

## 2014-05-01 ENCOUNTER — Encounter: Payer: Self-pay | Admitting: Emergency Medicine

## 2014-05-01 ENCOUNTER — Ambulatory Visit (INDEPENDENT_AMBULATORY_CARE_PROVIDER_SITE_OTHER): Payer: 59 | Admitting: Emergency Medicine

## 2014-05-01 VITALS — BP 156/89 | HR 77 | Temp 98.4°F | Resp 16 | Ht 66.0 in | Wt 209.0 lb

## 2014-05-01 DIAGNOSIS — R208 Other disturbances of skin sensation: Secondary | ICD-10-CM

## 2014-05-01 DIAGNOSIS — Z125 Encounter for screening for malignant neoplasm of prostate: Secondary | ICD-10-CM

## 2014-05-01 DIAGNOSIS — Z13220 Encounter for screening for lipoid disorders: Secondary | ICD-10-CM

## 2014-05-01 DIAGNOSIS — Z1211 Encounter for screening for malignant neoplasm of colon: Secondary | ICD-10-CM | POA: Diagnosis not present

## 2014-05-01 DIAGNOSIS — Z5181 Encounter for therapeutic drug level monitoring: Secondary | ICD-10-CM | POA: Diagnosis not present

## 2014-05-01 DIAGNOSIS — K219 Gastro-esophageal reflux disease without esophagitis: Secondary | ICD-10-CM | POA: Diagnosis not present

## 2014-05-01 DIAGNOSIS — Z0000 Encounter for general adult medical examination without abnormal findings: Secondary | ICD-10-CM | POA: Diagnosis not present

## 2014-05-01 DIAGNOSIS — R209 Unspecified disturbances of skin sensation: Secondary | ICD-10-CM

## 2014-05-01 DIAGNOSIS — Z1329 Encounter for screening for other suspected endocrine disorder: Secondary | ICD-10-CM

## 2014-05-01 DIAGNOSIS — Z130 Encounter for screening for diseases of the blood and blood-forming organs and certain disorders involving the immune mechanism: Secondary | ICD-10-CM

## 2014-05-01 LAB — POCT URINALYSIS DIPSTICK
BILIRUBIN UA: NEGATIVE
Glucose, UA: NEGATIVE
KETONES UA: NEGATIVE
LEUKOCYTES UA: NEGATIVE
NITRITE UA: NEGATIVE
Protein, UA: NEGATIVE
RBC UA: NEGATIVE
Spec Grav, UA: 1.02
Urobilinogen, UA: 0.2
pH, UA: 7

## 2014-05-01 LAB — IFOBT (OCCULT BLOOD): IFOBT: NEGATIVE

## 2014-05-01 MED ORDER — CLONAZEPAM 2 MG PO TABS: ORAL_TABLET | ORAL | Status: DC

## 2014-05-01 MED ORDER — TRAMADOL HCL 50 MG PO TABS: 50.0000 mg | ORAL_TABLET | Freq: Two times a day (BID) | ORAL | Status: DC

## 2014-05-01 MED ORDER — PANTOPRAZOLE SODIUM 40 MG PO TBEC: 40.0000 mg | DELAYED_RELEASE_TABLET | Freq: Every day | ORAL | Status: DC

## 2014-05-01 MED ORDER — TRAZODONE HCL 100 MG PO TABS: ORAL_TABLET | ORAL | Status: DC

## 2014-05-01 MED ORDER — CYCLOBENZAPRINE HCL 10 MG PO TABS: ORAL_TABLET | ORAL | Status: DC

## 2014-05-01 MED ORDER — LISINOPRIL-HYDROCHLOROTHIAZIDE 20-12.5 MG PO TABS: ORAL_TABLET | ORAL | Status: DC

## 2014-05-01 NOTE — Progress Notes (Deleted)
   Subjective:    Patient ID: Casey [name]., male    DOB: [birth year], 59 y.o.   MRN: [id number]  HPI    Review of Systems     Objective:   Physical Exam        Assessment & Plan:

## 2014-05-01 NOTE — Addendum Note (Signed)
Addended by: Kem Boroughs D on: 05/01/2014 04:42 PM   Modules accepted: Orders

## 2014-05-01 NOTE — Progress Notes (Signed)
   Subjective:    Patient ID: Casey Colson., male    DOB: [birth year], 59 y.o.   MRN: [id number] This chart was scribed for Arlyss Queen, MD by Zola Button, Medical Scribe. This patient was seen in room 21 and the patient's care was started at 2:16 PM.   HPI HPI Comments: Casey Wainer. is a 59 y.o. male who presents to the [facility] for a complete physical exam. Patient states he is doing so-so. He has been having some back pain for which he sees a chiropractor, Estill Batten, [place]. Patient did have a flu shot done at a drugstore this year. He states he does not have sleep apnea, but is on a CPAP machine. He does have trouble sleeping, for which he sees Dr. Brett Fairy.  He also states his right 4th finger has been bothering him some, and feels like his joint is slipping.  Patient also notes that his feet have been cold, even when wearing socks or using a blanket.  Patient has been laid off. He has been doing fine mentally. He served in [employer] for 14 years.   Review of Systems  Musculoskeletal: Positive for back pain.  Psychiatric/Behavioral: Positive for sleep disturbance.       Objective:   Physical Exam CONSTITUTIONAL: Well developed/well nourished HEAD: Normocephalic/atraumatic EYES: EOM/PERRL ENMT: Mucous membranes moist NECK: supple no meningeal signs SPINE: entire spine nontender CV: S1/S2 noted, no murmurs/rubs/gallops noted LUNGS: Lungs are clear to auscultation bilaterally, no apparent distress ABDOMEN: soft, nontender, no rebound or guarding GU: Prostate normal size, no masses. NEURO: Pt is awake/alert, moves all extremitiesx4 EXTREMITIES: pulses in feet are 2+. SKIN: warm, color normal PSYCH: no abnormalities of mood noted      Assessment & Plan:  Patient continues to struggle with sleep problems. He recently was laid off at work. He states he does not have worsening depression. He does have a CPAP machine at home. He is concerned about  his feet which are cold at night. He denies any true claudication symptoms. He also is overdue for his colonoscopy. We'll schedule arterial studies of the lower extremities as well as GI referral for colonoscopy.

## 2014-05-02 ENCOUNTER — Other Ambulatory Visit: Payer: Self-pay | Admitting: Emergency Medicine

## 2014-05-02 ENCOUNTER — Encounter: Payer: Self-pay | Admitting: Gastroenterology

## 2014-05-02 DIAGNOSIS — R972 Elevated prostate specific antigen [PSA]: Secondary | ICD-10-CM

## 2014-05-02 LAB — LIPID PANEL
Cholesterol: 190 mg/dL (ref 0–200)
HDL: 42 mg/dL (ref >=40)
LDL Cholesterol: 113 mg/dL — ABNORMAL HIGH (ref 0–99)
TRIGLYCERIDES: 173 mg/dL — AB (ref <150)
Total CHOL/HDL Ratio: 4.5 Ratio
VLDL: 35 mg/dL (ref 0–40)

## 2014-05-02 LAB — PSA: PSA: 4.57 ng/mL — ABNORMAL HIGH (ref <=4.00)

## 2014-05-02 LAB — COMPREHENSIVE METABOLIC PANEL
ALBUMIN: 4.1 g/dL (ref 3.5–5.2)
ALT: 32 U/L (ref 0–53)
AST: 22 U/L (ref 0–37)
Alkaline Phosphatase: 60 U/L (ref 39–117)
BUN: 18 mg/dL (ref 6–23)
CHLORIDE: 102 meq/L (ref 96–112)
CO2: 25 mEq/L (ref 19–32)
Calcium: 9.2 mg/dL (ref 8.4–10.5)
Creat: 0.92 mg/dL (ref 0.50–1.35)
GLUCOSE: 85 mg/dL (ref 70–99)
POTASSIUM: 4 meq/L (ref 3.5–5.3)
Sodium: 139 mEq/L (ref 135–145)
TOTAL PROTEIN: 6.7 g/dL (ref 6.0–8.3)
Total Bilirubin: 0.6 mg/dL (ref 0.2–1.2)

## 2014-05-02 LAB — CBC
HCT: 44.9 % (ref 39.0–52.0)
Hemoglobin: 15.2 g/dL (ref 13.0–17.0)
MCH: 31.2 pg (ref 26.0–34.0)
MCHC: 33.9 g/dL (ref 30.0–36.0)
MCV: 92.2 fL (ref 78.0–100.0)
MPV: 8.9 fL (ref 8.6–12.4)
PLATELETS: 246 10*3/uL (ref 150–400)
RBC: 4.87 MIL/uL (ref 4.22–5.81)
RDW: 13.4 % (ref 11.5–15.5)
WBC: 6.9 10*3/uL (ref 4.0–10.5)

## 2014-05-02 LAB — TSH: TSH: 0.674 u[IU]/mL (ref 0.350–4.500)

## 2014-05-04 ENCOUNTER — Ambulatory Visit (AMBULATORY_SURGERY_CENTER): Payer: Self-pay | Admitting: *Deleted

## 2014-05-04 VITALS — Ht 65.0 in | Wt 212.0 lb

## 2014-05-04 DIAGNOSIS — Z1211 Encounter for screening for malignant neoplasm of colon: Secondary | ICD-10-CM

## 2014-05-04 MED ORDER — NA SULFATE-K SULFATE-MG SULF 17.5-3.13-1.6 GM/177ML PO SOLN
1.0000 | Freq: Once | ORAL | Status: DC
Start: 2014-05-04 — End: 2014-05-14

## 2014-05-04 MED ORDER — NA SULFATE-K SULFATE-MG SULF 17.5-3.13-1.6 GM/177ML PO SOLN: 1.0000 | Freq: Once | ORAL | Status: DC

## 2014-05-04 NOTE — Progress Notes (Signed)
No egg or soy allergy. No anesthesia problems besides pt states he is hard to sedation.

## 2014-05-11 ENCOUNTER — Telehealth: Payer: Self-pay | Admitting: Gastroenterology

## 2014-05-11 NOTE — Telephone Encounter (Signed)
Mr sizelove states he has received two preps. One from [facility] at 80.00 [facility]. Looked in chart and we did send the suprep to both. The [facility] was cancelled but inadvertantly filled anyway. Apologized to pt about this mix up and instructed I will call [facility] and see how we can return this 80.00 prep for him. I  called [facility] and explained pt was ordered two prep accidentally and needed to return one as this was our over look here. They state this is not an issue but Mr Ferger has  to call and request this to be returned I cannot do this for him. Called Mr Encinas and informed him of this, gave him the customer service number of [phone] and he will call and request return . Pt very understanding and states he will call [facility] as instructed  Lelan Pons PV

## 2014-05-14 ENCOUNTER — Ambulatory Visit (AMBULATORY_SURGERY_CENTER): Payer: 59 | Admitting: Gastroenterology

## 2014-05-14 ENCOUNTER — Encounter: Payer: Self-pay | Admitting: Gastroenterology

## 2014-05-14 VITALS — BP 100/65 | HR 72 | Temp 97.0°F | Resp 20 | Ht 65.0 in | Wt 212.0 lb

## 2014-05-14 DIAGNOSIS — K5730 Diverticulosis of large intestine without perforation or abscess without bleeding: Secondary | ICD-10-CM

## 2014-05-14 DIAGNOSIS — Z1211 Encounter for screening for malignant neoplasm of colon: Secondary | ICD-10-CM

## 2014-05-14 HISTORY — PX: OTHER SURGICAL HISTORY: SHX169

## 2014-05-14 MED ORDER — SODIUM CHLORIDE 0.9 % IV SOLN: 500.0000 mL | INTRAVENOUS | Status: DC

## 2014-05-14 NOTE — Progress Notes (Signed)
To recovery, report to [facility], Therapist, sports. VSS

## 2014-05-14 NOTE — Patient Instructions (Signed)
YOU HAD AN ENDOSCOPIC PROCEDURE TODAY AT [facility]:   Refer to the procedure report that was given to you for any specific questions about what was found during the examination.  If the procedure report does not answer your questions, please call your gastroenterologist to clarify.  If you requested that your care partner not be given the details of your procedure findings, then the procedure report has been included in a sealed envelope for you to review at your convenience later.  YOU SHOULD EXPECT: Some feelings of bloating in the abdomen. Passage of more gas than usual.  Walking can help get rid of the air that was put into your GI tract during the procedure and reduce the bloating. If you had a lower endoscopy (such as a colonoscopy or flexible sigmoidoscopy) you may notice spotting of blood in your stool or on the toilet paper. If you underwent a bowel prep for your procedure, you may not have a normal bowel movement for a few days.  Please Note:  You might notice some irritation and congestion in your nose or some drainage.  This is from the oxygen used during your procedure.  There is no need for concern and it should clear up in a day or so.  SYMPTOMS TO REPORT IMMEDIATELY:   Following lower endoscopy (colonoscopy or flexible sigmoidoscopy):  Excessive amounts of blood in the stool  Significant tenderness or worsening of abdominal pains  Swelling of the abdomen that is new, acute  Fever of 100F or higher    For urgent or emergent issues, a gastroenterologist can be reached at any hour by calling [phone].   DIET: Your first meal following the procedure should be a small meal and then it is ok to progress to your normal diet. Heavy or fried foods are harder to digest and may make you feel nauseous or bloated.  Likewise, meals heavy in dairy and vegetables can increase bloating.  Drink plenty of fluids but you should avoid alcoholic beverages for 24  hours.  ACTIVITY:  You should plan to take it easy for the rest of today and you should NOT DRIVE or use heavy machinery until tomorrow (because of the sedation medicines used during the test).    FOLLOW UP: Our staff will call the number listed on your records the next business day following your procedure to check on you and address any questions or concerns that you may have regarding the information given to you following your procedure. If we do not reach you, we will leave a message.  However, if you are feeling well and you are not experiencing any problems, there is no need to return our call.  We will assume that you have returned to your regular daily activities without incident.  If any biopsies were taken you will be contacted by phone or by letter within the next 1-3 weeks.  Please call us at [phone] if you have not heard about the biopsies in 3 weeks.    SIGNATURES/CONFIDENTIALITY: You and/or your care partner have signed paperwork which will be entered into your electronic medical record.  These signatures attest to the fact that that the information above on your After Visit Summary has been reviewed and is understood.  Full responsibility of the confidentiality of this discharge information lies with you and/or your care-partner.   Resume medications. Information given on diverticulosis and high fiber diet with discharge instructions.

## 2014-05-14 NOTE — Op Note (Addendum)
[place]  [address] [place], [id number]   COLONOSCOPY PROCEDURE REPORT  PATIENT: Casey [name], Casey [name]  MR#: [id number] BIRTHDATE: Aug 23, 1955 , 19  yrs. old GENDER: male ENDOSCOPIST: Inda Castle, MD REFERRED XI:[facility]WTUUE Everlene Farrier, M.D. PROCEDURE DATE:  05/14/2014 PROCEDURE:   Colonoscopy, screening First Screening Colonoscopy - Avg.  risk and is 50 yrs.  old or older - No.  Prior Negative Screening - Now for repeat screening. 10 or more years since last screening  History of Adenoma - Now for follow-up colonoscopy & has been > or = to 3 yrs.  N/A ASA CLASS:   Class II INDICATIONS:Colorectal Neoplasm Risk Assessment for this procedure is average risk. MEDICATIONS: Propofol 170 mg IV  DESCRIPTION OF PROCEDURE:   After the risks benefits and alternatives of the procedure were thoroughly explained, informed consent was obtained.  The digital rectal exam revealed no abnormalities of the rectum.   The LB [id number]  endoscope was introduced through the anus and advanced to the cecum, which was identified by both the appendix and ileocecal valve. No adverse events experienced.   The quality of the prep was (Suprep was used) excellent.  The instrument was then slowly withdrawn as the colon was fully examined.      COLON FINDINGS: There was mild diverticulosis noted in the sigmoid colon.   The examination was otherwise normal.  Retroflexed views revealed no abnormalities. The time to cecum = 2.6 Withdrawal time = 7.6   The scope was withdrawn and the procedure completed. COMPLICATIONS: There were no immediate complications.  ENDOSCOPIC IMPRESSION: 1.   Mild diverticulosis was noted in the sigmoid colon 2.   The examination was otherwise normal  RECOMMENDATIONS: Continue current colorectal screening recommendations for "routine risk" patients with a repeat colonoscopy in 10 years.  eSigned:  Inda Castle, MD 05/14/2014 10:40 AM Revised: 05/14/2014 10:40  AM  cc:

## 2014-05-15 ENCOUNTER — Telehealth: Payer: Self-pay

## 2014-05-15 NOTE — Telephone Encounter (Signed)
Called [phone] and the line rang only one time then made a click sound.  I checked snap shot and it was his home number.  I called back and tried to leave a message after the click sound but not sure it recorded the message. maw

## 2014-05-16 ENCOUNTER — Telehealth: Payer: Self-pay

## 2014-05-16 NOTE — Telephone Encounter (Signed)
Last OV was a physical. Does pt need to RTC for evaluation of back pain? Please advise.

## 2014-05-16 NOTE — Telephone Encounter (Signed)
Pt states he have been having back pain and would like to be referred to have an MRI. Please call [phone]

## 2014-05-17 ENCOUNTER — Other Ambulatory Visit: Payer: Self-pay | Admitting: Emergency Medicine

## 2014-05-17 ENCOUNTER — Ambulatory Visit (HOSPITAL_COMMUNITY)
Admission: RE | Admit: 2014-05-17 | Discharge: 2014-05-17 | Disposition: A | Discharge status: Home / Self Care | Payer: 59 | Source: Ambulatory Visit | Attending: Vascular Surgery | Admitting: Vascular Surgery

## 2014-05-17 DIAGNOSIS — R208 Other disturbances of skin sensation: Secondary | ICD-10-CM | POA: Diagnosis present

## 2014-05-17 DIAGNOSIS — R209 Unspecified disturbances of skin sensation: Secondary | ICD-10-CM

## 2014-05-17 NOTE — Telephone Encounter (Signed)
He needs to come see me and so we can document the evaluation of his back prior to getting his MRI. The insurance company will require this

## 2014-05-17 NOTE — Telephone Encounter (Signed)
Spoke with pt, advised message from Dr. Everlene Farrier. Pt will come in to see him on Monday morning.

## 2014-05-21 ENCOUNTER — Telehealth: Payer: Self-pay

## 2014-05-21 NOTE — Telephone Encounter (Signed)
Pt did have doppler US done on the 17th. The vein center cancelled our order and put in a new one; which makes it look like the study was cancelled. Results are under imaging tab.

## 2014-05-21 NOTE — Telephone Encounter (Signed)
-----   Message from [name], MD sent at 05/18/2014  9:27 AM EDT ----- Will you check and see why this was cancelled ----- Message -----    From: SYSTEM    Sent: 05/18/2014  12:04 AM      To: [name], MD

## 2014-06-18 ENCOUNTER — Telehealth: Payer: Self-pay

## 2014-06-18 NOTE — Telephone Encounter (Signed)
Dr. Everlene Farrier   Patient is looking forward to referral for his right hand.  He talked to you about it on his last OV.   [phone]

## 2014-06-18 NOTE — Telephone Encounter (Signed)
Dr Everlene Farrier: It looks like pt is requesting a referral to ortho regarding his hand/finger that he spoke to you about on 3.1.16 visit. Please advise.

## 2014-06-19 ENCOUNTER — Other Ambulatory Visit: Payer: Self-pay | Admitting: Radiology

## 2014-06-19 DIAGNOSIS — M79641 Pain in right hand: Secondary | ICD-10-CM

## 2014-06-19 DIAGNOSIS — M79644 Pain in right finger(s): Secondary | ICD-10-CM

## 2014-06-19 NOTE — Telephone Encounter (Signed)
Will place referral

## 2014-06-19 NOTE — Telephone Encounter (Signed)
Please make referral to Dr. Amedeo Plenty   for evaluation of his hand and finger discomfort

## 2014-08-02 ENCOUNTER — Telehealth: Payer: Self-pay

## 2014-08-02 DIAGNOSIS — K219 Gastro-esophageal reflux disease without esophagitis: Secondary | ICD-10-CM

## 2014-08-02 NOTE — Telephone Encounter (Signed)
Patient has changed his insurance and needs [facility] [address]   He is currently taking six medications, please transfer all of them.    [phone]

## 2014-08-03 NOTE — Telephone Encounter (Signed)
All the medications from 3/1. Ok to send the controlled meds also?

## 2014-08-04 NOTE — Telephone Encounter (Signed)
Okay to transfer all medications

## 2014-08-06 MED ORDER — LISINOPRIL-HYDROCHLOROTHIAZIDE 20-12.5 MG PO TABS: ORAL_TABLET | ORAL | Status: DC

## 2014-08-06 MED ORDER — TRAZODONE HCL 100 MG PO TABS: ORAL_TABLET | ORAL | Status: DC

## 2014-08-06 MED ORDER — CLONAZEPAM 2 MG PO TABS: ORAL_TABLET | ORAL | Status: DC

## 2014-08-06 MED ORDER — CYCLOBENZAPRINE HCL 10 MG PO TABS: ORAL_TABLET | ORAL | Status: DC

## 2014-08-06 MED ORDER — TRAMADOL HCL 50 MG PO TABS: 50.0000 mg | ORAL_TABLET | Freq: Two times a day (BID) | ORAL | Status: DC

## 2014-08-06 MED ORDER — PANTOPRAZOLE SODIUM 40 MG PO TBEC: 40.0000 mg | DELAYED_RELEASE_TABLET | Freq: Every day | ORAL | Status: DC

## 2014-08-06 NOTE — Telephone Encounter (Signed)
Sent 4 non- controlled electronically and called in both controlled. Advised pt on VM.

## 2014-08-07 ENCOUNTER — Telehealth: Payer: Self-pay

## 2014-08-07 NOTE — Telephone Encounter (Signed)
Casey [name] WOULD LIKE DR. DAUB TO KNOW THAT HE DOES  NOT USE THE [facility]AN ANYMORE BECAUSE HE HAS SWITCHED INSURANCE PLANS. HE NEEDS ALL OF [facility] MEDICATIONS [facility] [address]. THE PHARMACIST TOLD HIM HE NEEDED TO CALL HIS DOCTOR'S OFFICE TO HAVE THIS DONE. BEST PHONE [phone] (CELL)  Casey [name]

## 2014-08-08 NOTE — Telephone Encounter (Signed)
Left message that this was done yesterday.

## 2014-11-06 ENCOUNTER — Ambulatory Visit (INDEPENDENT_AMBULATORY_CARE_PROVIDER_SITE_OTHER): Payer: BC Managed Care – PPO | Admitting: Emergency Medicine

## 2014-11-06 ENCOUNTER — Encounter: Payer: Self-pay | Admitting: Emergency Medicine

## 2014-11-06 ENCOUNTER — Ambulatory Visit (INDEPENDENT_AMBULATORY_CARE_PROVIDER_SITE_OTHER): Payer: BC Managed Care – PPO

## 2014-11-06 VITALS — BP 116/73 | HR 80 | Temp 98.5°F | Resp 16 | Ht 65.0 in | Wt 201.6 lb

## 2014-11-06 DIAGNOSIS — M545 Low back pain, unspecified: Secondary | ICD-10-CM

## 2014-11-06 DIAGNOSIS — Z23 Encounter for immunization: Secondary | ICD-10-CM

## 2014-11-06 DIAGNOSIS — M25552 Pain in left hip: Secondary | ICD-10-CM

## 2014-11-06 DIAGNOSIS — M79605 Pain in left leg: Principal | ICD-10-CM

## 2014-11-06 NOTE — Progress Notes (Addendum)
This chart was scribed for Arlyss Queen, MD by Moises Blood, Medical Scribe. This patient was seen in Room 21 and the patient's care was started 7:58 AM.  Chief Complaint:  Chief Complaint  Patient presents with  . Follow-up    6 month check up on psa level  . Hyperlipidemia    HPI: Casey [name]. is a 59 y.o. male who reports to [facility] today for follow-up.  Back Pain He complains having some intermittent lower back pain. He denies any injury to the area. He doesn't recall having an x-ray done here before. He takes Aleve everyday, 2 in the morning and 2 at night.   PSA He saw his urologist and had PSA done. He had colonoscopy done and it's normal.   Sleep Study He has to sleep using the sleep machine. He denies sleep apnea.    Past Medical History  Diagnosis Date  . Complication of anesthesia     nausea and vomiting  . PONV (postoperative nausea and vomiting)   . Hypertension   . Chronic kidney disease     multiple renal stone  . GERD (gastroesophageal reflux disease)   . Arthritis     joints  . Kidney stones   . Insomnia secondary to restless leg syndrome 07/17/2013  . Circadian rhythm sleep disorder, shift work type 07/17/2013  . OSA on CPAP   . Allergy     seasonal  . Cancer     skin  . COPD (chronic obstructive pulmonary disease)     pt denies  . Sleep apnea     cpap   Past Surgical History  Procedure Laterality Date  . Tonsillectomy    . Varicellectomy    . Meniscul    . Acl repair left knee    . Rotator cuff repair      left shoulder   . Ligaments repair left wrist     . Fusion great toe right foot    . Tendons cut on left heel    . Cholecystectomy    . Stone extractions and urethral stents      multiple  . Cardiac catheterization  2005    negative   . Skin cancer removed from left cheek    . Cystoscopy/retrograde/ureteroscopy/stone extraction with basket  04/07/2012    Procedure: CYSTOSCOPY/RETROGRADE/URETEROSCOPY/STONE EXTRACTION WITH  BASKET;  Surgeon: Malka So, MD;  Location: [facility];  Service: Urology;  Laterality: Bilateral;  cystoscopy, bilateral stent removal, bilateral ureteroscopy with bilateral stone extraction and insertion of right ureteral stent  . Colonoscopy  2005   Social History   Social History  . Marital Status: Married    Spouse Name: [name]  . Number of Children: 2  . Years of Education: 12+    Occupational History  . MANAGER    Social History Main Topics  . Smoking status: Never Smoker   . Smokeless tobacco: Never Used  . Alcohol Use: No  . Drug Use: No  . Sexual Activity: Yes   Other Topics Concern  . 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   Patient is a retired Agricultural consultant.   Patient has two children.   Patient has 5 years of [employer].   Patient is right-handed.   Patient drinks very little caffeine.   Family History  Problem Relation Age of Onset  . Cancer Mother   . Cancer Father   . Diabetes  Brother   . Colon cancer Neg Hx    Allergies  Allergen Reactions  . Codeine Itching  . Hydrocodone Itching   Prior to Admission medications   Medication Sig Start Date End Date Taking? Authorizing Provider  B Complex Vitamins (VITAMIN-B COMPLEX PO) Take 1 tablet by mouth daily.   Yes Historical Provider, MD  Calcium-Magnesium-Vitamin D (CALCIUM MAGNESIUM PO) Take 1 tablet by mouth daily.   Yes Historical Provider, MD  cetirizine (ZYRTEC) 10 MG tablet Take 10 mg by mouth daily.   Yes Historical Provider, MD  Cholecalciferol (VITAMIN D-3) 5000 UNITS TABS Take 1 tablet by mouth daily.   Yes Historical Provider, MD  clonazePAM (KLONOPIN) 2 MG tablet TAKE 2 TABLETS BY MOUTH AT BEDTIME AS NEEDED FOR ANXIETY 08/06/14  Yes [name], MD  cyclobenzaprine (FLEXERIL) 10 MG tablet Take 4 tablets by mouth at  bedtime 08/06/14  Yes [name], MD  lisinopril-hydrochlorothiazide (PRINZIDE,ZESTORETIC) 20-12.5 MG per tablet Take 1 tablet by  mouth  every evening 08/06/14  Yes [name], MD  pantoprazole (PROTONIX) 40 MG tablet Take 1 tablet (40 mg total) by mouth daily. 08/06/14  Yes [name], MD  traMADol (ULTRAM) 50 MG tablet Take 1 tablet (50 mg total) by mouth 2 (two) times daily. 08/06/14  Yes [name], MD  traZODone (DESYREL) 100 MG tablet Take 2 tablets by mouth at  bedtime 08/06/14  Yes [name], MD     ROS: The patient denies fevers, chills, night sweats, unintentional weight loss, chest pain, palpitations, wheezing, dyspnea on exertion, nausea, vomiting, abdominal pain, dysuria, hematuria, melena, numbness, weakness, or tingling. Has back pain (lower)  All other systems have been reviewed and were otherwise negative with the exception of those mentioned in the HPI and as above.    PHYSICAL EXAM: Filed Vitals:   11/06/14 0754  BP: 116/73  Pulse: 80  Temp: 98.5 F (36.9 C)  Resp: 16   Body mass index is 33.55 kg/(m^2).   General: Alert, no acute distress HEENT:  Normocephalic, atraumatic, oropharynx patent. Eye: [name] [facility] Cardiovascular:  Regular rate and rhythm, no rubs murmurs or gallops.  No Carotid bruits, radial pulse intact. No pedal edema.  Respiratory: Clear to auscultation bilaterally.  No wheezes, rales, or rhonchi.  No cyanosis, no use of accessory musculature Abdominal: No organomegaly, abdomen is soft and non-tender, positive bowel sounds.  No masses. Musculoskeletal: Gait intact. No edema, tenderness; tenderness over left greater trochanter, limited internal and external rotation of hip, tenderness on L5-S1 left motor strength, reflexes normal Skin: No rashes. Neurologic: Facial musculature symmetric. Psychiatric: Patient acts appropriately throughout our interaction.  Lymphatic: No cervical or submandibular lymphadenopathy Genitourinary/Anorectal: No acute findings   LABS: Results for orders placed or performed in visit on 05/01/14  CBC  Result Value Ref Range   WBC 6.9 4.0 -  10.5 K/uL   RBC 4.87 4.22 - 5.81 MIL/uL   Hemoglobin 15.2 13.0 - 17.0 g/dL   HCT 44.9 39.0 - 52.0 %   MCV 92.2 78.0 - 100.0 fL   MCH 31.2 26.0 - 34.0 pg   MCHC 33.9 30.0 - 36.0 g/dL   RDW 13.4 11.5 - 15.5 %   Platelets 246 150 - 400 K/uL   MPV 8.9 8.6 - 12.4 fL  Comprehensive metabolic panel  Result Value Ref Range   Sodium 139 135 - 145 mEq/L   Potassium 4.0 3.5 - 5.3 mEq/L   Chloride 102 96 - 112 mEq/L  CO2 25 19 - 32 mEq/L   Glucose, Bld 85 70 - 99 mg/dL   BUN 18 6 - 23 mg/dL   Creat 0.92 0.50 - 1.35 mg/dL   Total Bilirubin 0.6 0.2 - 1.2 mg/dL   Alkaline Phosphatase 60 39 - 117 U/L   AST 22 0 - 37 U/L   ALT 32 0 - 53 U/L   Total Protein 6.7 6.0 - 8.3 g/dL   Albumin 4.1 3.5 - 5.2 g/dL   Calcium 9.2 8.4 - 10.5 mg/dL  Lipid panel  Result Value Ref Range   Cholesterol 190 0 - 200 mg/dL   Triglycerides 173 (H) <150 mg/dL   HDL 42 >=40 mg/dL   Total CHOL/HDL Ratio 4.5 Ratio   VLDL 35 0 - 40 mg/dL   LDL Cholesterol 113 (H) 0 - 99 mg/dL  TSH  Result Value Ref Range   TSH 0.674 0.350 - 4.500 uIU/mL  PSA  Result Value Ref Range   PSA 4.57 (H) <=4.00 ng/mL  POCT urinalysis dipstick  Result Value Ref Range   Color, UA yellow    Clarity, UA clear    Glucose, UA neg    Bilirubin, UA neg    Ketones, UA neg    Spec Grav, UA 1.020    Blood, UA neg    pH, UA 7.0    Protein, UA neg    Urobilinogen, UA 0.2    Nitrite, UA neg    Leukocytes, UA Negative   IFOBT POC (occult bld, rslt in office)  Result Value Ref Range   IFOBT Negative      EKG/XRAY:   Primary read interpreted by Dr. Everlene Farrier at [facility]. There is 2 small calcifications over the left kidney. Back films reveal mild degenerative changes. Hip films reveal mild degenerative changes.   ASSESSMENT/PLAN: Patient doing well. We'll recheck in 6 months first physical exam. No definite abnormality seen on hip films. I suspect this is musculoskeletal. He will continue Aleve for now.I personally performed the services  described in this documentation, which was scribed in my presence. The recorded information has been reviewed and is accurate. He will return to clinic in 6 months for his routine physical.   Gross sideeffects, risk and benefits, and alternatives of medications d/w patient. Patient is aware that all medications have potential sideeffects and we are unable to predict every sideeffect or drug-drug interaction that may occur.  Arlyss Queen MD 11/06/2014 7:58 AM

## 2014-12-04 ENCOUNTER — Encounter: Payer: Self-pay | Admitting: Emergency Medicine

## 2014-12-04 ENCOUNTER — Other Ambulatory Visit: Payer: Self-pay | Admitting: Emergency Medicine

## 2014-12-04 DIAGNOSIS — K219 Gastro-esophageal reflux disease without esophagitis: Secondary | ICD-10-CM

## 2014-12-04 NOTE — Telephone Encounter (Signed)
Refills on all medications.  [id number]

## 2014-12-05 ENCOUNTER — Other Ambulatory Visit: Payer: Self-pay | Admitting: Emergency Medicine

## 2014-12-05 MED ORDER — CLONAZEPAM 2 MG PO TABS: ORAL_TABLET | ORAL | Status: DC

## 2014-12-05 MED ORDER — TRAZODONE HCL 100 MG PO TABS: ORAL_TABLET | ORAL | Status: DC

## 2014-12-05 MED ORDER — TRAMADOL HCL 50 MG PO TABS: 50.0000 mg | ORAL_TABLET | Freq: Two times a day (BID) | ORAL | Status: DC

## 2014-12-05 MED ORDER — LISINOPRIL-HYDROCHLOROTHIAZIDE 20-12.5 MG PO TABS: ORAL_TABLET | ORAL | Status: DC

## 2014-12-05 MED ORDER — CYCLOBENZAPRINE HCL 10 MG PO TABS: ORAL_TABLET | ORAL | Status: DC

## 2014-12-05 MED ORDER — PANTOPRAZOLE SODIUM 40 MG PO TBEC: 40.0000 mg | DELAYED_RELEASE_TABLET | Freq: Every day | ORAL | Status: DC

## 2014-12-05 NOTE — Telephone Encounter (Signed)
Please advise on tramadol or Clonazepam.

## 2014-12-06 ENCOUNTER — Telehealth: Payer: Self-pay

## 2014-12-06 ENCOUNTER — Ambulatory Visit: Payer: 59 | Admitting: Adult Health

## 2014-12-06 ENCOUNTER — Other Ambulatory Visit: Payer: Self-pay | Admitting: Emergency Medicine

## 2014-12-06 NOTE — Telephone Encounter (Signed)
[facility]am. She stated that they did not have an order for it. I gave them Dr. Everlene Farrier exact order. Pt. Was notifed

## 2014-12-14 ENCOUNTER — Ambulatory Visit: Payer: 59 | Admitting: Family Medicine

## 2014-12-19 ENCOUNTER — Other Ambulatory Visit: Payer: Self-pay | Admitting: Emergency Medicine

## 2014-12-21 NOTE — Telephone Encounter (Signed)
Faxed

## 2015-02-23 ENCOUNTER — Other Ambulatory Visit: Payer: Self-pay | Admitting: Emergency Medicine

## 2015-02-25 ENCOUNTER — Telehealth: Payer: Self-pay | Admitting: Family Medicine

## 2015-02-25 NOTE — Telephone Encounter (Signed)
Faxed pt  RX of Klonopin

## 2015-03-03 HISTORY — PX: BACK SURGERY: SHX140

## 2015-03-28 ENCOUNTER — Other Ambulatory Visit: Payer: Self-pay | Admitting: Emergency Medicine

## 2015-04-01 NOTE — Telephone Encounter (Signed)
[facility]

## 2015-04-10 ENCOUNTER — Ambulatory Visit (INDEPENDENT_AMBULATORY_CARE_PROVIDER_SITE_OTHER): Payer: BC Managed Care – PPO | Admitting: Emergency Medicine

## 2015-04-10 VITALS — BP 114/78 | HR 96 | Temp 98.6°F | Resp 18 | Ht 65.0 in | Wt 203.8 lb

## 2015-04-10 DIAGNOSIS — J029 Acute pharyngitis, unspecified: Secondary | ICD-10-CM

## 2015-04-10 DIAGNOSIS — J209 Acute bronchitis, unspecified: Secondary | ICD-10-CM

## 2015-04-10 DIAGNOSIS — R05 Cough: Secondary | ICD-10-CM | POA: Diagnosis not present

## 2015-04-10 DIAGNOSIS — R059 Cough, unspecified: Secondary | ICD-10-CM

## 2015-04-10 LAB — POCT INFLUENZA A/B
Influenza A, POC: NEGATIVE
Influenza B, POC: NEGATIVE

## 2015-04-10 LAB — POCT RAPID STREP A (OFFICE): Rapid Strep A Screen: NEGATIVE

## 2015-04-10 MED ORDER — BENZONATATE 100 MG PO CAPS: 100.0000 mg | ORAL_CAPSULE | Freq: Three times a day (TID) | ORAL | Status: DC | PRN

## 2015-04-10 MED ORDER — DOXYCYCLINE HYCLATE 100 MG PO TABS: 100.0000 mg | ORAL_TABLET | Freq: Two times a day (BID) | ORAL | Status: DC

## 2015-04-10 NOTE — Progress Notes (Signed)
Patient ID: Casey Thielen., male   DOB: 07/10/1955, 60 y.o.   MRN: [id number]    By signing my name below, I, [name], attest that this documentation has been prepared under the direction and in the presence of [name], MD Electronically Signed: Ladene Artist, ED Scribe 04/10/2015 at 8:33 AM.  Chief Complaint:  Chief Complaint  Patient presents with  . Cough  . Sore Throat   HPI: Casey Kinnard. is a 60 y.o. male, with a h/o HTN and COPD, who reports to [facility] today complaining of gradually worsening productive cough with green sputum onset 5 days ago. Pt reports associated sneezing and sore throat that is worsened with swallowing. He denies fever, chills, SOB. Pt reports sick contact at church 1 week ago with similar symptoms. He has had a flu vaccine this season.   Pt reports gradually improving, intermittent right hand pain for several months. He has been seen by a hand specialist in the past for cortisone injections for trigger finger which he states provided no significant relief. Pt does not wish to return to hand specialist for treatment.   Past Medical History  Diagnosis Date  . Complication of anesthesia     nausea and vomiting  . PONV (postoperative nausea and vomiting)   . Hypertension   . Chronic kidney disease     multiple renal stone  . GERD (gastroesophageal reflux disease)   . Arthritis     joints  . Kidney stones   . Insomnia secondary to restless leg syndrome 07/17/2013  . Circadian rhythm sleep disorder, shift work type 07/17/2013  . OSA on CPAP   . Allergy     seasonal  . Cancer ([place])     skin  . COPD (chronic obstructive pulmonary disease) ([place])     pt denies  . Sleep apnea     cpap   Past Surgical History  Procedure Laterality Date  . Tonsillectomy    . Varicellectomy    . Meniscul    . Acl repair left knee    . Rotator cuff repair      left shoulder   . Ligaments repair left wrist     . Fusion great toe right foot    . Tendons cut  on left heel    . Cholecystectomy    . Stone extractions and urethral stents      multiple  . Cardiac catheterization  2005    negative   . Skin cancer removed from left cheek    . Cystoscopy/retrograde/ureteroscopy/stone extraction with basket  04/07/2012    Procedure: CYSTOSCOPY/RETROGRADE/URETEROSCOPY/STONE EXTRACTION WITH BASKET;  Surgeon: Malka So, MD;  Location: [facility];  Service: Urology;  Laterality: Bilateral;  cystoscopy, bilateral stent removal, bilateral ureteroscopy with bilateral stone extraction and insertion of right ureteral stent  . Colonoscopy  2005   Social History   Social History  . Marital Status: Married    Spouse Name: Casey [name]  . Number of Children: 2  . Years of Education: 12+    Occupational History  . MANAGER    Social History Main Topics  . Smoking status: Never Smoker   . Smokeless tobacco: Never Used  . Alcohol Use: No  . Drug Use: No  . Sexual Activity: Yes   Other Topics Concern  . 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   Patient is a retired Agricultural consultant.   Patient  has two children.   Patient has 5 years of [employer].   Patient is right-handed.   Patient drinks very little caffeine.   Family History  Problem Relation Age of Onset  . Cancer Mother   . Cancer Father   . Diabetes Brother   . Colon cancer Neg Hx    Allergies  Allergen Reactions  . Codeine Itching  . Hydrocodone Itching   Prior to Admission medications   Medication Sig Start Date End Date Taking? Authorizing Provider  B Complex Vitamins (VITAMIN-B COMPLEX PO) Take 1 tablet by mouth daily.    Historical Provider, MD  Calcium-Magnesium-Vitamin D (CALCIUM MAGNESIUM PO) Take 1 tablet by mouth daily.    Historical Provider, MD  cetirizine (ZYRTEC) 10 MG tablet Take 10 mg by mouth daily.    Historical Provider, MD  Cholecalciferol (VITAMIN D-3) 5000 UNITS TABS Take 1 tablet by mouth daily.    Historical Provider, MD    clonazePAM (KLONOPIN) 2 MG tablet TAKE 2 TABLETS BY MOUTH AT BEDTIME AS NEEDED FOR ANXIETY 02/25/15   [name], MD  cyclobenzaprine (FLEXERIL) 10 MG tablet Take 4 tablets by mouth at  bedtime 12/05/14   [name], MD  lisinopril-hydrochlorothiazide (PRINZIDE,ZESTORETIC) 20-12.5 MG tablet Take 1 tablet by mouth  every evening 12/05/14   [name], MD  pantoprazole (PROTONIX) 40 MG tablet Take 1 tablet (40 mg total) by mouth daily. 12/05/14   [name], MD  traMADol (ULTRAM) 50 MG tablet TAKE 1 TABLET BY MOUTH TWICE A DAY 03/29/15   [name], MD  traZODone (DESYREL) 100 MG tablet Take 2 tablets by mouth at  bedtime 12/05/14   [name], MD   ROS: The patient denies fevers, -chills, night sweats, unintentional weight loss, chest pain, palpitations, wheezing, -dyspnea on exertion, nausea, vomiting, abdominal pain, dysuria, hematuria, melena, numbness, weakness, or tingling. +cough, +sore throat, +sneezing, +arthralgias   All other systems have been reviewed and were otherwise negative with the exception of those mentioned in the HPI and as above.    PHYSICAL EXAM: Filed Vitals:   04/10/15 0822  BP: 114/78  Pulse: 96  Temp: 98.6 F (37 C)  Resp: 18   Body mass index is 33.91 kg/(m^2).  General: Alert, no acute distress HEENT:  Normocephalic, atraumatic, oropharynx patent. Significant nasal congestion.  Eye: [name] [facility] Cardiovascular:  Regular rate and rhythm, no rubs murmurs or gallops. No Carotid bruits, radial pulse intact. No pedal edema.  Respiratory: Occasional rhonchi. No wheezes or rales.No cyanosis, no use of accessory musculature Abdominal: No organomegaly, abdomen is soft and non-tender, positive bowel sounds. No masses. Musculoskeletal: Gait intact. No edema, tenderness Skin: No rashes. Neurologic: Facial musculature symmetric. Psychiatric: Patient acts appropriately throughout our interaction. Lymphatic: No cervical or submandibular  lymphadenopathy  LABS: Results for orders placed or performed in visit on 04/10/15  POCT rapid strep A  Result Value Ref Range   Rapid Strep A Screen Negative Negative  POCT Influenza A/B  Result Value Ref Range   Influenza A, POC Negative Negative   Influenza B, POC Negative Negative  EKG/XRAY:   Primary read interpreted by Dr. Everlene Farrier at [facility].  ASSESSMENT/PLAN:  We'll treat as an acute bronchitis. He will be on doxycycline and Tessalon Perles. He will force fluids. Return to clinic if no improvement in the next 48-72 hours.I personally performed the services described in this documentation, which was scribed in my presence. The recorded information has been reviewed and is accurate.  Gross sideeffects, risk and benefits, and alternatives of medications d/w patient. Patient is aware that all medications have potential sideeffects and we are unable to predict every sideeffect or drug-drug interaction that may occur.  Arlyss Queen MD 04/10/2015 8:24 AM

## 2015-04-10 NOTE — Patient Instructions (Signed)

## 2015-04-12 ENCOUNTER — Telehealth: Payer: Self-pay

## 2015-04-12 NOTE — Telephone Encounter (Signed)
PATIENT STATES HE SAW DR. DAUB WED. FOR BRONCHITIS. HE PRESCRIBED HIM AN ANTIBIOTIC AND SOMETHING FOR HIS COUGH. HE WANTS DR. DAUB TO KNOW THAT HE IS  NOT ANY BETTER. HE STILL HAS THE DRAINAGE AND COUGH. HE KNOWS DR. DAUB IS GOING OUT OF THE [place] TOMORROW SO HE WOULD REALLY LIKE TO GET A CALL BACK BEFORE HE LEAVES. HE WILL COME BACK IN IF HE NEEDS TO. BEST PHONE [phone] (CELL)  PHARMACY CHOICE IS [facility]  ON [address].   [place]

## 2015-04-16 MED ORDER — AZITHROMYCIN 250 MG PO TABS: ORAL_TABLET | ORAL | Status: DC

## 2015-04-16 NOTE — Telephone Encounter (Signed)
Pt did return call and states either is fine the doxycycline was working just needs  A few more or the azithromycin he will try please call in to [address]

## 2015-04-16 NOTE — Telephone Encounter (Signed)
Patient is not answering his phone when I call. I left message for patient that I would try one more time in 10-15 minutes. If he calls back, he can be put hold and let me know. Otherwise, I will just have patient come back for recheck if he does not answer his phone again.

## 2015-04-16 NOTE — Telephone Encounter (Addendum)
Left message for patient to check and see how he's doing. Patient was placed on doxycycline, consider switching to azithromycin if no improvement still. If patient is worse, rtc for recheck. Please let me know what patient would like to do.

## 2015-04-16 NOTE — Telephone Encounter (Signed)
Pt is still having yellow drainage when blowing nose not as much but some pt still would like a call back about a refill

## 2015-04-16 NOTE — Telephone Encounter (Signed)
Script sent for azithromycin to cover for sinusitis and bronchitis. Please have patient continue taking Zyrtec. RTC in 1 week if no improvement. Thank you for help your help communicating with this patient, Butch Penny!

## 2015-05-07 ENCOUNTER — Encounter: Payer: BC Managed Care – PPO | Admitting: Emergency Medicine

## 2015-05-09 ENCOUNTER — Encounter: Payer: Self-pay | Admitting: Emergency Medicine

## 2015-05-09 ENCOUNTER — Ambulatory Visit (INDEPENDENT_AMBULATORY_CARE_PROVIDER_SITE_OTHER): Payer: BC Managed Care – PPO | Admitting: Emergency Medicine

## 2015-05-09 ENCOUNTER — Telehealth: Payer: Self-pay | Admitting: Family Medicine

## 2015-05-09 VITALS — BP 120/92 | HR 76 | Temp 98.1°F | Resp 16 | Ht 65.0 in | Wt 206.2 lb

## 2015-05-09 DIAGNOSIS — Z1159 Encounter for screening for other viral diseases: Secondary | ICD-10-CM | POA: Diagnosis not present

## 2015-05-09 DIAGNOSIS — Z0000 Encounter for general adult medical examination without abnormal findings: Secondary | ICD-10-CM

## 2015-05-09 DIAGNOSIS — G4733 Obstructive sleep apnea (adult) (pediatric): Secondary | ICD-10-CM | POA: Diagnosis not present

## 2015-05-09 DIAGNOSIS — Z23 Encounter for immunization: Secondary | ICD-10-CM

## 2015-05-09 DIAGNOSIS — Z125 Encounter for screening for malignant neoplasm of prostate: Secondary | ICD-10-CM

## 2015-05-09 DIAGNOSIS — G4700 Insomnia, unspecified: Secondary | ICD-10-CM | POA: Diagnosis not present

## 2015-05-09 DIAGNOSIS — Z13220 Encounter for screening for lipoid disorders: Secondary | ICD-10-CM

## 2015-05-09 LAB — COMPLETE METABOLIC PANEL WITH GFR
ALBUMIN: 3.9 g/dL (ref 3.6–5.1)
ALK PHOS: 67 U/L (ref 40–115)
ALT: 23 U/L (ref 9–46)
AST: 19 U/L (ref 10–35)
BILIRUBIN TOTAL: 0.5 mg/dL (ref 0.2–1.2)
BUN: 28 mg/dL — AB (ref 7–25)
CALCIUM: 8.9 mg/dL (ref 8.6–10.3)
CO2: 25 mmol/L (ref 20–31)
Chloride: 104 mmol/L (ref 98–110)
Creat: 1.08 mg/dL (ref 0.70–1.25)
GFR, Est African American: 86 mL/min (ref >=60)
GFR, Est Non African American: 74 mL/min (ref >=60)
GLUCOSE: 81 mg/dL (ref 65–99)
POTASSIUM: 4.1 mmol/L (ref 3.5–5.3)
Sodium: 140 mmol/L (ref 135–146)
TOTAL PROTEIN: 6.5 g/dL (ref 6.1–8.1)

## 2015-05-09 LAB — LIPID PANEL
Cholesterol: 162 mg/dL (ref 125–200)
HDL: 42 mg/dL (ref >=40)
LDL CALC: 103 mg/dL (ref <130)
Total CHOL/HDL Ratio: 3.9 Ratio (ref <=5.0)
Triglycerides: 83 mg/dL (ref <150)
VLDL: 17 mg/dL (ref <30)

## 2015-05-09 LAB — POCT URINALYSIS DIP (MANUAL ENTRY)
BILIRUBIN UA: NEGATIVE
Bilirubin, UA: NEGATIVE
Glucose, UA: NEGATIVE
Leukocytes, UA: NEGATIVE
Nitrite, UA: NEGATIVE
PH UA: 7
PROTEIN UA: NEGATIVE
SPEC GRAV UA: 1.02
UROBILINOGEN UA: 0.2

## 2015-05-09 LAB — CBC WITH DIFFERENTIAL/PLATELET
Basophils Absolute: 0.1 10*3/uL (ref 0.0–0.1)
Basophils Relative: 1 % (ref 0–1)
Eosinophils Absolute: 0.4 10*3/uL (ref 0.0–0.7)
Eosinophils Relative: 5 % (ref 0–5)
HEMATOCRIT: 41.2 % (ref 39.0–52.0)
HEMOGLOBIN: 14 g/dL (ref 13.0–17.0)
LYMPHS PCT: 33 % (ref 12–46)
Lymphs Abs: 2.4 10*3/uL (ref 0.7–4.0)
MCH: 30.7 pg (ref 26.0–34.0)
MCHC: 34 g/dL (ref 30.0–36.0)
MCV: 90.4 fL (ref 78.0–100.0)
MONO ABS: 0.6 10*3/uL (ref 0.1–1.0)
MONOS PCT: 8 % (ref 3–12)
MPV: 9.6 fL (ref 8.6–12.4)
NEUTROS ABS: 3.9 10*3/uL (ref 1.7–7.7)
NEUTROS PCT: 53 % (ref 43–77)
Platelets: 232 10*3/uL (ref 150–400)
RBC: 4.56 MIL/uL (ref 4.22–5.81)
RDW: 13.3 % (ref 11.5–15.5)
WBC: 7.3 10*3/uL (ref 4.0–10.5)

## 2015-05-09 LAB — POC MICROSCOPIC URINALYSIS (UMFC): MUCUS RE: ABSENT

## 2015-05-09 MED ORDER — CYCLOBENZAPRINE HCL 10 MG PO TABS: ORAL_TABLET | ORAL | Status: DC

## 2015-05-09 MED ORDER — ZOSTER VACCINE LIVE 19400 UNT/0.65ML ~~LOC~~ SOLR: 0.6500 mL | Freq: Once | SUBCUTANEOUS | Status: DC

## 2015-05-09 MED ORDER — LISINOPRIL-HYDROCHLOROTHIAZIDE 20-12.5 MG PO TABS: ORAL_TABLET | ORAL | Status: DC

## 2015-05-09 MED ORDER — CLONAZEPAM 2 MG PO TABS: ORAL_TABLET | ORAL | Status: DC

## 2015-05-09 MED ORDER — TRAZODONE HCL 100 MG PO TABS: ORAL_TABLET | ORAL | Status: DC

## 2015-05-09 MED ORDER — TRAMADOL HCL 50 MG PO TABS: 50.0000 mg | ORAL_TABLET | Freq: Two times a day (BID) | ORAL | Status: DC

## 2015-05-09 NOTE — Telephone Encounter (Signed)
Fax pt RX to the pharmacy in [place] today it went thru

## 2015-05-09 NOTE — Progress Notes (Signed)
Patient ID: Casey [name]., male   DOB: [birth year], 60 y.o.   MRN: [id number]   By signing my name below, I, Casey [name], attest that this documentation has been prepared under the direction and in the presence of Casey Russian, MD Electronically Signed: Ladene Artist, ED Scribe 05/09/2015 at 2:37 PM.  Chief Complaint:  Chief Complaint  Patient presents with  . Annual Exam   HPI: [name]. is a 60 y.o. male who reports to [facility] today for an annual exam. Pt denies recurrent URIs or sinusitis. No complaints reported at this visit.   Sleep Apnea Pt is using a c-pap at night for sleep apnea.   Immunizations  Pt has received a flu and tdap. He requests a prescription for the shingles vaccine at this time.   Preventative Maintenance  Pt had his colonoscopy done last year.   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   Past Medical History  Diagnosis Date  . Complication of anesthesia     nausea and vomiting  . PONV (postoperative nausea and vomiting)   . Hypertension   . Chronic kidney disease     multiple renal stone  . GERD (gastroesophageal reflux disease)   . Arthritis     joints  . Kidney stones   . Insomnia secondary to restless leg syndrome 07/17/2013  . Circadian rhythm sleep disorder, shift work type 07/17/2013  . OSA on CPAP   . Allergy     seasonal  . Cancer (Casey [name])     skin  . COPD (chronic obstructive pulmonary disease) (Casey [name])     pt denies  . Sleep apnea     cpap   Past Surgical History  Procedure Laterality Date  . Tonsillectomy    . Varicellectomy    . Meniscul    . Acl repair left knee    . Rotator cuff repair      left shoulder   . Ligaments repair left wrist     . Fusion great toe right foot    . Tendons cut on left heel    . Cholecystectomy    . Stone extractions and urethral stents      multiple  . Cardiac catheterization  2005    negative   . Skin cancer  removed from left cheek    . Cystoscopy/retrograde/ureteroscopy/stone extraction with basket  04/07/2012    Procedure: CYSTOSCOPY/RETROGRADE/URETEROSCOPY/STONE EXTRACTION WITH BASKET;  Surgeon: Casey So, MD;  Location: [facility];  Service: Urology;  Laterality: Bilateral;  cystoscopy, bilateral stent removal, bilateral ureteroscopy with bilateral stone extraction and insertion of right ureteral stent  . Colonoscopy  2005   Social History   Social History  . Marital Status: Married    Spouse Name: Casey [name]  . Number of Children: 2  . Years of Education: 12+    Occupational History  . Mechanic 1    Social History Main Topics  . Smoking status: Never Smoker   . Smokeless tobacco: Never Used  . Alcohol Use: No  . Drug Use: No  . Sexual Activity: Yes   Other Topics Concern  . 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   Patient is a retired Agricultural consultant.   Patient has two children.   Patient has 5 years of [employer].   Patient is right-handed.   Patient drinks  very little caffeine.   Family History  Problem Relation Age of Onset  . Cancer Mother   . Cancer Father   . Diabetes Brother   . Colon cancer Neg Hx    Allergies  Allergen Reactions  . Codeine Itching  . Hydrocodone Itching   Prior to Admission medications   Medication Sig Start Date End Date Taking? Authorizing Provider  azithromycin (ZITHROMAX) 250 MG tablet Start with 2 tablets today, then 1 daily thereafter. 04/16/15   Casey Eagles, PA-C  B Complex Vitamins (VITAMIN-B COMPLEX PO) Take 1 tablet by mouth daily.    Historical Provider, MD  benzonatate (TESSALON) 100 MG capsule Take 1-2 capsules (100-200 mg total) by mouth 3 (three) times daily as needed for cough. 04/10/15   Casey Russian, MD  Calcium-Magnesium-Vitamin D (CALCIUM MAGNESIUM PO) Take 1 tablet by mouth daily.    Historical Provider, MD  cetirizine (ZYRTEC) 10 MG tablet Take 10 mg by mouth daily.     Historical Provider, MD  Cholecalciferol (VITAMIN D-3) 5000 UNITS TABS Take 1 tablet by mouth daily.    Historical Provider, MD  clonazePAM (KLONOPIN) 2 MG tablet TAKE 2 TABLETS BY MOUTH AT BEDTIME AS NEEDED FOR ANXIETY 02/25/15   Casey Russian, MD  cyclobenzaprine (FLEXERIL) 10 MG tablet Take 4 tablets by mouth at  bedtime 12/05/14   Casey Russian, MD  docusate sodium (COLACE) 100 MG capsule Take 100 mg by mouth 2 (two) times daily.    Historical Provider, MD  doxycycline (VIBRA-TABS) 100 MG tablet Take 1 tablet (100 mg total) by mouth 2 (two) times daily. 04/10/15   Casey Russian, MD  lisinopril-hydrochlorothiazide (PRINZIDE,ZESTORETIC) 20-12.5 MG tablet Take 1 tablet by mouth  every evening 12/05/14   Casey Russian, MD  pantoprazole (PROTONIX) 40 MG tablet Take 1 tablet (40 mg total) by mouth daily. 12/05/14   Casey Russian, MD  Probiotic Product (PROBIOTIC DAILY PO) Take by mouth.    Historical Provider, MD  traMADol (ULTRAM) 50 MG tablet TAKE 1 TABLET BY MOUTH TWICE A DAY 03/29/15   Casey Russian, MD  traZODone (DESYREL) 100 MG tablet Take 2 tablets by mouth at  bedtime 12/05/14   Casey Russian, MD   ROS: The patient denies fevers, chills, night sweats, unintentional weight loss, chest pain, palpitations, wheezing, dyspnea on exertion, nausea, vomiting, abdominal pain, dysuria, hematuria, melena, numbness, weakness, or tingling. +sneezing, +eye itching, +arthralgias, +back pain, +environmental allergies, +sleep disturbances   All other systems have been reviewed and were otherwise negative with the exception of those mentioned in the HPI and as above.    PHYSICAL EXAM: Filed Vitals:   05/09/15 1420 05/09/15 1422  BP: 120/90 120/92  Pulse: 76   Temp: 98.1 F (36.7 C)   Resp: 16    Body mass index is 34.31 kg/(m^2).  General: Alert, no acute distress HEENT:  Normocephalic, atraumatic, oropharynx patent. Eye: Casey [name] [name] Cardiovascular: Regular rate and rhythm, no rubs murmurs or gallops.  No Carotid bruits, radial pulse intact. No pedal edema.  Respiratory: Clear to auscultation bilaterally. No wheezes, rales, or rhonchi. No cyanosis, no use of accessory musculature Abdominal: No organomegaly, abdomen is soft and non-tender, positive bowel sounds. No masses. Musculoskeletal: Gait intact. No edema, tenderness GU: Normal prostate exam. Skin: No rashes. Neurologic: Facial musculature symmetric. Psychiatric: Patient acts appropriately throughout our interaction. Lymphatic: No cervical or submandibular lymphadenopathy  LABS:  EKG/XRAY:   Primary read interpreted by Dr. Everlene Farrier at [facility]  ASSESSMENT/PLAN:  Patient is doing well. I refilled all of his medications. He did request a pneumonia vaccine which I thought was reasonable and he was given a 23. He was also given a prescription for shingles vaccine but not to have it administered for at least 1 month.I personally performed the services described in this documentation, which was scribed in my presence. The recorded information has been reviewed and is accurate.   Gross sideeffects, risk and benefits, and alternatives of medications d/w patient. Patient is aware that all medications have potential sideeffects and we are unable to predict every sideeffect or drug-drug interaction that may occur.  Arlyss Queen MD 05/09/2015 2:25 PM

## 2015-05-10 ENCOUNTER — Other Ambulatory Visit: Payer: Self-pay | Admitting: Emergency Medicine

## 2015-05-10 DIAGNOSIS — R972 Elevated prostate specific antigen [PSA]: Secondary | ICD-10-CM

## 2015-05-10 LAB — PSA: PSA: 5.2 ng/mL — AB (ref <=4.00)

## 2015-05-10 LAB — HEPATITIS C ANTIBODY: HCV AB: NEGATIVE

## 2015-05-11 ENCOUNTER — Other Ambulatory Visit: Payer: Self-pay | Admitting: Emergency Medicine

## 2015-05-17 NOTE — Addendum Note (Signed)
Addended by: Wyatt Haste on: 05/17/2015 08:08 AM   Modules accepted: SmartSet

## 2015-08-25 ENCOUNTER — Other Ambulatory Visit: Payer: Self-pay | Admitting: Emergency Medicine

## 2015-10-03 ENCOUNTER — Emergency Department (HOSPITAL_COMMUNITY)
Admission: EM | Admit: 2015-10-03 | Discharge: 2015-10-03 | Disposition: A | Discharge status: Home / Self Care | Payer: Worker's Compensation | Attending: Emergency Medicine | Admitting: Emergency Medicine

## 2015-10-03 ENCOUNTER — Emergency Department (HOSPITAL_COMMUNITY): Payer: Worker's Compensation

## 2015-10-03 ENCOUNTER — Encounter (HOSPITAL_COMMUNITY): Payer: Self-pay | Admitting: Emergency Medicine

## 2015-10-03 DIAGNOSIS — J449 Chronic obstructive pulmonary disease, unspecified: Secondary | ICD-10-CM | POA: Diagnosis not present

## 2015-10-03 DIAGNOSIS — T675XXA Heat exhaustion, unspecified, initial encounter: Secondary | ICD-10-CM | POA: Insufficient documentation

## 2015-10-03 DIAGNOSIS — Z85820 Personal history of malignant melanoma of skin: Secondary | ICD-10-CM | POA: Diagnosis not present

## 2015-10-03 DIAGNOSIS — Z79899 Other long term (current) drug therapy: Secondary | ICD-10-CM | POA: Insufficient documentation

## 2015-10-03 DIAGNOSIS — N189 Chronic kidney disease, unspecified: Secondary | ICD-10-CM | POA: Diagnosis not present

## 2015-10-03 DIAGNOSIS — R55 Syncope and collapse: Secondary | ICD-10-CM

## 2015-10-03 DIAGNOSIS — I129 Hypertensive chronic kidney disease with stage 1 through stage 4 chronic kidney disease, or unspecified chronic kidney disease: Secondary | ICD-10-CM | POA: Insufficient documentation

## 2015-10-03 DIAGNOSIS — E860 Dehydration: Secondary | ICD-10-CM | POA: Diagnosis not present

## 2015-10-03 LAB — URINALYSIS, ROUTINE W REFLEX MICROSCOPIC
Bilirubin Urine: NEGATIVE
Glucose, UA: NEGATIVE mg/dL
Hgb urine dipstick: NEGATIVE
KETONES UR: NEGATIVE mg/dL
LEUKOCYTES UA: NEGATIVE
NITRITE: NEGATIVE
PH: 6 (ref 5.0–8.0)
Protein, ur: NEGATIVE mg/dL
SPECIFIC GRAVITY, URINE: 1.033 — AB (ref 1.005–1.030)

## 2015-10-03 LAB — BASIC METABOLIC PANEL
ANION GAP: 7 (ref 5–15)
BUN: 32 mg/dL — AB (ref 6–20)
CO2: 29 mmol/L (ref 22–32)
Calcium: 9.4 mg/dL (ref 8.9–10.3)
Chloride: 106 mmol/L (ref 101–111)
Creatinine, Ser: 1.49 mg/dL — ABNORMAL HIGH (ref 0.61–1.24)
GFR calc Af Amer: 57 mL/min — ABNORMAL LOW (ref >60)
GFR calc non Af Amer: 49 mL/min — ABNORMAL LOW (ref >60)
GLUCOSE: 102 mg/dL — AB (ref 65–99)
POTASSIUM: 3.6 mmol/L (ref 3.5–5.1)
Sodium: 142 mmol/L (ref 135–145)

## 2015-10-03 LAB — CBC
HEMATOCRIT: 41.2 % (ref 39.0–52.0)
HEMOGLOBIN: 14.3 g/dL (ref 13.0–17.0)
MCH: 31.7 pg (ref 26.0–34.0)
MCHC: 34.7 g/dL (ref 30.0–36.0)
MCV: 91.4 fL (ref 78.0–100.0)
Platelets: 250 10*3/uL (ref 150–400)
RBC: 4.51 MIL/uL (ref 4.22–5.81)
RDW: 13 % (ref 11.5–15.5)
WBC: 8.6 10*3/uL (ref 4.0–10.5)

## 2015-10-03 LAB — CBG MONITORING, ED: Glucose-Capillary: 104 mg/dL — ABNORMAL HIGH (ref 65–99)

## 2015-10-03 MED ORDER — SODIUM CHLORIDE 0.9 % IV BOLUS (SEPSIS)
1000.0000 mL | Freq: Once | INTRAVENOUS | Status: AC
  Administered 2015-10-03: 1000 mL via INTRAVENOUS

## 2015-10-03 MED ORDER — DIAZEPAM 5 MG/ML IJ SOLN
2.5000 mg | Freq: Once | INTRAMUSCULAR | Status: AC
  Administered 2015-10-03: 2.5 mg via INTRAVENOUS
  Filled 2015-10-03: qty 2

## 2015-10-03 MED ORDER — ACETAMINOPHEN 325 MG PO TABS
650.0000 mg | ORAL_TABLET | Freq: Once | ORAL | Status: AC
  Administered 2015-10-03: 650 mg via ORAL
  Filled 2015-10-03: qty 2

## 2015-10-03 MED ORDER — MECLIZINE HCL 25 MG PO TABS: 25.0000 mg | ORAL_TABLET | Freq: Three times a day (TID) | ORAL | 0 refills | Status: DC | PRN

## 2015-10-03 MED ORDER — MECLIZINE HCL 25 MG PO TABS
50.0000 mg | ORAL_TABLET | Freq: Once | ORAL | Status: AC
  Administered 2015-10-03: 50 mg via ORAL
  Filled 2015-10-03: qty 2

## 2015-10-03 NOTE — ED Notes (Signed)
Patient alert and oriented x4 on rounds. No complaints of pain. Patient does not appear to be in distress at this time.

## 2015-10-03 NOTE — ED Notes (Signed)
Report given to [facility]

## 2015-10-03 NOTE — ED Notes (Signed)
Checked patient cbg it was 39 notified RN Levada Dy of blood sugar

## 2015-10-03 NOTE — ED Notes (Signed)
Placed patient back on the monitor up dated patient vitals

## 2015-10-03 NOTE — ED Notes (Signed)
Gave patient some water

## 2015-10-03 NOTE — ED Notes (Signed)
Tried to walk patient pt felt dizzy when he stood up couldn't walk at this time will try later

## 2015-10-03 NOTE — ED Triage Notes (Addendum)
Pt from work via [employer] with c/o syncope x 2.  Reports he passed out and reported it to the office.  Other staff later checked on pt and found him face down in the grass.  Pt does not remember falling or how he got on the ground.  12 lead unremarkable.  Pt reports he only drank 2 bottles of water today.  Emesis x 2 with EMS.  Given 50 mL NS and 4 mg Zofran.  NAD, A&Ox4 with EMS.

## 2015-10-03 NOTE — ED Provider Notes (Signed)
[facility] DEPT Provider Note   CSN: [id number] Arrival date & time: 10/03/15  1327  First Provider Contact:  First MD Initiated Contact with Patient 10/03/15 1438        History   Chief Complaint Chief Complaint  Patient presents with  . Heat Exposure    HPI Safeer Gillman. is a 60 y.o. male.  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 He did drink to Gatorade bottles full water earlier today because he was working he started to become very hot. He states he usually sweats a lot but he started to feel lightheaded and cold and clammy. He then started feeling dizzy in the next thing he knows he was waking up on the ground. He was able to call for help but then started feeling lightheaded again. He denies any chest pain, shortness of breath, abdominal pain. No unilateral symptoms. He does take medication for blood pressure but has had no new changes in his medication for some time.   The history is provided by the patient.  Loss of Consciousness   This is a new problem. The current episode started 1 to 2 hours ago. The problem occurs constantly. The problem has been resolved. Length of episode of loss of consciousness: unknown. Associated with: pt was outside working and was getting very hot.  he started sweating a lot and then became nauseated and blacked out. Associated symptoms include diaphoresis, headaches and nausea. Pertinent negatives include chest pain, palpitations, visual change and vomiting. Treatments tried: ivf from ems. The treatment provided moderate relief. His past medical history is significant for HTN. His past medical history does not include CAD, DM or TIA.    Past Medical History:  Diagnosis Date  . Allergy    seasonal  . Arthritis    joints  . Cancer ([place])    skin  . Chronic kidney disease    multiple renal stone  . Circadian rhythm sleep disorder, shift work  type 07/17/2013  . Complication of anesthesia    nausea and vomiting  . COPD (chronic obstructive pulmonary disease) ([place])    pt denies  . GERD (gastroesophageal reflux disease)   . Hypertension   . Insomnia secondary to restless leg syndrome 07/17/2013  . Kidney stones   . OSA on CPAP   . PONV (postoperative nausea and vomiting)   . Sleep apnea    cpap    Patient Active Problem List   Diagnosis Date Noted  . Insomnia with sleep apnea 12/06/2013  . OSA on CPAP 12/06/2013  . Insomnia secondary to restless leg syndrome 07/17/2013  . Circadian rhythm sleep disorder, shift work type 07/17/2013  . Other malaise and fatigue 07/17/2013  . Depression 06/17/2013  . Back pain 04/26/2011  . Insomnia 04/26/2011    Past Surgical History:  Procedure Laterality Date  . acl repair left knee    . CARDIAC CATHETERIZATION  2005   negative   . CHOLECYSTECTOMY    . COLONOSCOPY  2005  . CYSTOSCOPY/RETROGRADE/URETEROSCOPY/STONE EXTRACTION WITH BASKET  04/07/2012   Procedure: CYSTOSCOPY/RETROGRADE/URETEROSCOPY/STONE EXTRACTION WITH BASKET;  Surgeon: Malka So, MD;  Location: [facility];  Service: Urology;  Laterality: Bilateral;  cystoscopy, bilateral stent removal, bilateral ureteroscopy with bilateral stone extraction and insertion of right ureteral stent  . fusion great toe right foot    . ligaments repair left wrist     . meniscul    .  ROTATOR CUFF REPAIR     left shoulder   . skin cancer removed from left cheek    . stone extractions and urethral stents     multiple  . tendons cut on left heel    . TONSILLECTOMY    . varicellectomy         Home Medications    Prior to Admission medications   Medication Sig Start Date End Date Taking? Authorizing Provider  B Complex Vitamins (VITAMIN-B COMPLEX PO) Take 1 tablet by mouth daily.    Historical Provider, MD  Calcium-Magnesium-Vitamin D (CALCIUM MAGNESIUM PO) Take 1 tablet by mouth daily.    Historical Provider, MD  cetirizine (ZYRTEC) 10  MG tablet Take 10 mg by mouth daily.    Historical Provider, MD  Cholecalciferol (VITAMIN D-3) 5000 UNITS TABS Take 1 tablet by mouth daily.    Historical Provider, MD  clonazePAM (KLONOPIN) 2 MG tablet TAKE 2 TABLETS BY MOUTH AT BEDTIME AS NEEDED FOR ANXIETY 05/09/15   [name], MD  cyclobenzaprine (FLEXERIL) 10 MG tablet Take 4 tablets by mouth at  bedtime 05/09/15   [name], MD  docusate sodium (COLACE) 100 MG capsule Take 100 mg by mouth 2 (two) times daily.    Historical Provider, MD  lisinopril-hydrochlorothiazide (PRINZIDE,ZESTORETIC) 20-12.5 MG tablet Take 1 tablet by mouth  every evening 05/09/15   [name], MD  pantoprazole (PROTONIX) 40 MG tablet TAKE 1 TABLET (40 MG TOTAL) BY MOUTH DAILY. 08/26/15   [name], MD  Probiotic Product (PROBIOTIC DAILY PO) Take by mouth.    Historical Provider, MD  traMADol (ULTRAM) 50 MG tablet Take 1 tablet (50 mg total) by mouth 2 (two) times daily. 05/09/15   [name], MD  traZODone (DESYREL) 100 MG tablet Take 2 tablets by mouth at  bedtime 05/09/15   [name], MD  zoster vaccine live, PF, (ZOSTAVAX) 29562 UNT/0.65ML injection Inject 19,400 Units into the skin once. 05/09/15   [name], MD    Family History Family History  Problem Relation Age of Onset  . Cancer Mother   . Cancer Father   . Diabetes Brother   . Colon cancer Neg Hx     Social History Social History  Substance Use Topics  . Smoking status: Never Smoker  . Smokeless tobacco: Never Used  . Alcohol use No     Allergies   Codeine and Hydrocodone   Review of Systems Review of Systems  Constitutional: Positive for diaphoresis.  Cardiovascular: Positive for syncope. Negative for chest pain and palpitations.  Gastrointestinal: Positive for nausea. Negative for vomiting.  Neurological: Positive for headaches.  All other systems reviewed and are negative.    Physical Exam Updated Vital Signs BP (!) 107/53 (BP Location: Right Arm)   Pulse 86    SpO2 100% Comment: RA  Physical Exam  Constitutional: He is oriented to person, place, and time. He appears well-developed and well-nourished. No distress.  HENT:  Head: Normocephalic and atraumatic.  Mouth/Throat: Oropharynx is clear and moist.  Eyes: Conjunctivae and EOM are normal. Pupils are equal, round, and reactive to light.  Neck: Normal range of motion. Neck supple.  Cardiovascular: Normal rate, regular rhythm and intact distal pulses.   No murmur heard. Pulmonary/Chest: Effort normal and breath sounds normal. No respiratory distress. He has no wheezes. He has no rales.  Abdominal: Soft. He exhibits no distension. There is no tenderness. There is no rebound and no guarding.  Musculoskeletal:  Normal range of motion. He exhibits no edema or tenderness.  Neurological: He is alert and oriented to person, place, and time. He has normal strength. No cranial nerve deficit or sensory deficit.  Skin: Skin is warm. No rash noted. He is diaphoretic. No erythema.  Psychiatric: He has a normal mood and affect. His behavior is normal.  Nursing note and vitals reviewed.    ED Treatments / Results  Labs (all labs ordered are listed, but only abnormal results are displayed) Labs Reviewed  BASIC METABOLIC PANEL - Abnormal; Notable for the following:       Result Value   Glucose, Bld 102 (*)    BUN 32 (*)    Creatinine, Ser 1.49 (*)    GFR calc non Af Amer 49 (*)    GFR calc Af Amer 57 (*)    All other components within normal limits  CBC  URINALYSIS, ROUTINE W REFLEX MICROSCOPIC (NOT AT [facility])  CBG MONITORING, ED    EKG  EKG Interpretation  Date/Time:  Thursday October 03 2015 13:40:42 EDT Ventricular Rate:  88 PR Interval:  164 QRS Duration: 110 QT Interval:  376 QTC Calculation: 454 R Axis:   -6 Text Interpretation:  Normal sinus rhythm Normal ECG No significant change since last tracing Confirmed by Maryan Rued  MD, Loree Fee ([id number]) on 10/03/2015 1:52:37 PM       Radiology No  results found.  Procedures Procedures (including critical care time)  Medications Ordered in ED Medications  sodium chloride 0.9 % bolus 1,000 mL (not administered)     Initial Impression / Assessment and Plan / ED Course  I have reviewed the triage vital signs and the nursing notes.  Pertinent labs & imaging results that were available during my care of the patient were reviewed by me and considered in my medical decision making (see chart for details).  Clinical Course    Patient is a [age]-year-old male today presenting after syncope related to being out in the heat. Feel that patient's syncope was most likely related to dehydration and vagal response. Patient states that he became very hot and had been sweating a lot all morning. He had prodromal symptoms and then woke up on the ground. Symptoms were worsened with position and better with lying down. Patient received IV fluids by EMS with improvement of symptoms. He denied any chest pain, shortness of breath, abdominal pain or unilateral weakness at any point in time. EKG within normal limits. Labs are consistent with mild AKI today which is most consistent with prerenal dehydration. Patient is feeling much better after IV fluids.  Chest x-ray pending.  Will need to ambulate pt. 5:01 PM Pt ambulated without diffiuclty and will d/c home. Final Clinical Impressions(s) / ED Diagnoses   Final diagnoses:  Heat exhaustion, initial encounter  Dehydration  Syncope and collapse    New Prescriptions New Prescriptions   No medications on file     Blanchie Dessert, MD 10/03/15 1701

## 2015-10-03 NOTE — ED Notes (Signed)
Ambulated patient in the hallway... Patient stated that he didn't feel dizzy all while walking.Marland KitchenMarland KitchenMarland Kitchen

## 2015-10-03 NOTE — ED Provider Notes (Signed)
Patient obtained in turnover from Dr. Maryan Rued, patient working outside all day having some weakness and passing out. Was given significant amounts of IV fluids with improvement. However patient was persistently vertiginous. CT scan of the head was negative for acute pathology. Patient was able to ambulate post Valium. DC home.   Deno Etienne, DO 10/03/15 2152

## 2015-10-04 ENCOUNTER — Telehealth: Payer: Self-pay

## 2015-10-04 NOTE — Telephone Encounter (Signed)
Pt would like Dr. Everlene Farrier to know-Pt had heat exhaustion at [facility] and was sent to [facility]. Pt was at [facility] about 10 hours, he was released at 11:30 last night.  Pt says he is doing a lot better.

## 2015-10-04 NOTE — Telephone Encounter (Signed)
I am sorry that happened. I would be happy to see him next week if he is having any persistent problems

## 2015-10-07 NOTE — Telephone Encounter (Signed)
Pt advised.

## 2015-10-16 ENCOUNTER — Ambulatory Visit (INDEPENDENT_AMBULATORY_CARE_PROVIDER_SITE_OTHER): Payer: BC Managed Care – PPO | Admitting: Family Medicine

## 2015-10-16 VITALS — BP 122/80 | HR 79 | Temp 98.6°F | Resp 16 | Ht 65.0 in | Wt 206.4 lb

## 2015-10-16 DIAGNOSIS — E860 Dehydration: Secondary | ICD-10-CM | POA: Diagnosis not present

## 2015-10-16 LAB — BASIC METABOLIC PANEL WITH GFR
BUN: 24 mg/dL (ref 7–25)
CALCIUM: 9.7 mg/dL (ref 8.6–10.3)
CHLORIDE: 101 mmol/L (ref 98–110)
CO2: 30 mmol/L (ref 20–31)
CREATININE: 1.18 mg/dL (ref 0.70–1.25)
GFR, Est African American: 77 mL/min (ref >=60)
GFR, Est Non African American: 67 mL/min (ref >=60)
GLUCOSE: 92 mg/dL (ref 65–99)
Potassium: 4.9 mmol/L (ref 3.5–5.3)
Sodium: 141 mmol/L (ref 135–146)

## 2015-10-16 NOTE — Patient Instructions (Addendum)
IF you received an x-ray today, you will receive an invoice from [facility]. Please contact [facility] at [phone] with questions or concerns regarding your invoice.   IF you received labwork today, you will receive an invoice from [employer]. Please contact [facility] at [phone] with questions or concerns regarding your invoice.   Our billing staff will not be able to assist you with questions regarding bills from these companies.  You will be contacted with the lab results as soon as they are available. The fastest way to get your results is to activate your My Chart account. Instructions are located on the last page of this paperwork. If you have not heard from [place] regarding the results in 2 weeks, please contact this office.     Dehydration in Sports Dehydration is a condition in which you do not have enough fluid or water in your body. Your body needs a certain amount of water and other fluid to maintain its blood volume. During exercise, your body may not be able to maintain the fluid levels that are needed to function properly. Dehydration happens when you take in less fluid than you lose. Athletes lose fluid during exercise when they sweat and breathe. Additional fluid is lost during urination, vomiting, and diarrhea. To prevent dehydration, it is important for athletes to take in enough water and fluid to replace the fluid that they lose during exercise. CAUSES Common causes of dehydration among athletes include:  Diarrhea.  Vomiting.  Not drinking enough fluid during strenuous exercise or during an illness.  Not eating enough food during strenuous exercise or during an illness.  Not consuming enough fluid or food after strenuous exercise.  Exercising in hot or humid weather. RISK FACTORS This condition is more likely to develop in:  Athletes who are taking certain medicines that cause the body to lose excess fluid  (diuretics).  Athletes who have a chronic illness, such as diabetes.  Young children.  Older adults.  Athletes who live at high altitudes.  Endurance athletes. SYMPTOMS  Mild Dehydration  Thirst.  Dry lips.  Slightly dry mouth.  Dry, warm skin. Moderate Dehydration  Very dry mouth.  Muscle cramps.  Dark urine and decreased urine production.  Decreased tear production.  Headache.  Light-headedness, especially when you stand up from a sitting position. Severe Dehydration  Changes in skin.  Blue lips.  Skin does not spring back quickly when lightly pinched and released.  Changes in body fluids.  Extreme thirst.  No tears.  Not able to sweat when body temperature is high, such as in hot weather.  Minimal urine production.  Changes in vital signs.  Rapid, weak pulse (more than 100 beats per minute when you are sitting still).  Rapid breathing.  Low blood pressure.  Unconsciousness.  Other changes.  Sunken eyes.  Cold hands and feet.  Confusion and lethargy.  Difficulty being awakened.  Fainting (syncope).  Short-term weight loss. DIAGNOSIS This condition may be diagnosed based on your symptoms. You may also have tests to determine how severe your dehydration is. These tests may include:  Urine tests.  Blood tests. TREATMENT Dehydration should be treated right away. Do not wait until dehydration becomes severe. Treatment depends on the severity of the dehydration. Treatment for Mild Dehydration  Drinking plenty of water to replace the fluid you have lost.  Replacing minerals in your blood (electrolytes) that you may have lost. Treatment for Moderate Dehydration  Consuming oral rehydration solution (  ORS). Treatment for Severe Dehydration  Receiving fluid through an IV tube.  Receiving electrolyte solution through a feeding tube that is passed through your nose and into your stomach (nasogastric tube or NG tube). HOME CARE  INSTRUCTIONS  Drink enough fluid to keep your urine clear or pale yellow.  Drink water or fluid slowly by taking small sips.  Have food or beverages that contain electrolytes. Examples include salt, bananas, and sports drinks.  Take over-the-counter and prescription medicines only as told by your health care provider.  Prepare ORS according to the manufacturer's instructions. Take sips of ORS every 5 minutes until your urine returns to normal.  If you have vomiting or diarrhea, continue to try to drink water, ORS, or both.  If you have diarrhea, avoid:  Beverages that contain caffeine.  Fruit juice.  Milk.  Carbonated soft drinks.  Do not take salt tablets. This can lead to the condition of having too much sodium in your body (hypernatremia). PREVENTION  Drink water before, during, and after physical activity, even if you do not feel thirsty. Drink small amounts of water frequently throughout sporting events. Drink more water if you are exercising in hot or humid weather or in high altitudes.  If you are exercising for more than an hour, consider drinking a sports drink.  If you are experiencing vomiting or diarrhea, avoid exercise.  Before and after exercise, eat plenty of foods that have a high water content. These include fruits and vegetables.  Avoid alcohol before, during, and after strenuous exercise. SEEK MEDICAL CARE IF:  You cannot eat or drink without vomiting.  You have severe diarrhea with vomiting or a fever.  You have severe diarrhea without vomiting or a fever.  You have had moderate diarrhea during a period of more than 24 hours. SEEK IMMEDIATE MEDICAL CARE IF:  You have extreme thirst.  You have not urinated in 6-8 hours, or you have urinated only a small amount of very dark urine.  You have shriveled skin.  You are dizzy, confused, or both.   This information is not intended to replace advice given to you by your health care provider. Make sure  you discuss any questions you have with your health care provider.   Document Released: 02/16/2005 Document Revised: 11/07/2014 Document Reviewed: 03/02/2014 Elsevier Interactive Patient Education [employer].

## 2015-10-17 ENCOUNTER — Telehealth: Payer: Self-pay

## 2015-10-17 NOTE — Telephone Encounter (Signed)
Absolutely - he wanted to return on Monday 8/21, thanks.

## 2015-10-17 NOTE — Telephone Encounter (Signed)
Pt was seen here on 10/16/15 by Dr. Brigitte Pulse for Dehydration. He was given a RTW note. He would like this note revised to say-He can RTW with full duties and no restrictions. He would like this faxed to his work-[phone], attention to [facility]. Please advise at [phone]

## 2015-10-17 NOTE — Telephone Encounter (Signed)
Ok to write Dr. Brigitte Pulse?

## 2015-10-18 NOTE — Telephone Encounter (Signed)
Note faxed.

## 2015-10-18 NOTE — Telephone Encounter (Signed)
Pt.notified

## 2015-10-23 NOTE — Progress Notes (Signed)
Subjective:  By signing my name below, I, Casey [name], attest that this documentation has been prepared under the direction and in the presence of Delman Cheadle, MD. Electronically Signed: Moises [name], [place]. 10/23/2015 , 4:51 PM .  Patient was seen in Room 12 .   Patient ID: Casey Tedford., male    DOB: 12-03-1955, 60 y.o.   MRN: [id number] Chief Complaint  Patient presents with  . Other    need a release to return to work, per pt was seen over 2 weeks ago for hurt exhaustion    HPI Casey [name]. is a 60 y.o. male who presents to [facility] requesting a release form to return to work. Patient was seen over 2 weeks ago for heat exhaustion and ended up in the ER. He was given plenty of IV fluids. He hasn't had dizzy spell or headache for the past 3~4 days. He's been taking aleve and vitamins. He would like to return to work in 5 days full duty, as his job is 4 days of 10 hours. He denies urinary symptoms, bowel symptoms, sleep disturbances or headaches.   Past Medical History:  Diagnosis Date  . Allergy    seasonal  . Arthritis    joints  . Cancer ([place])    skin  . Chronic kidney disease    multiple renal stone  . Circadian rhythm sleep disorder, shift work type 07/17/2013  . Complication of anesthesia    nausea and vomiting  . COPD (chronic obstructive pulmonary disease) ([place])    pt denies  . GERD (gastroesophageal reflux disease)   . Hypertension   . Insomnia secondary to restless leg syndrome 07/17/2013  . Kidney stones   . OSA on CPAP   . PONV (postoperative nausea and vomiting)   . Sleep apnea    cpap   Prior to Admission medications   Medication Sig Start Date End Date Taking? Authorizing Provider  Ascorbic Acid (VITAMIN C) 100 MG tablet Take 100 mg by mouth daily.   Yes Historical Provider, MD  B Complex Vitamins (VITAMIN-B COMPLEX PO) Take 1 tablet by mouth daily.   Yes Historical Provider, MD  cetirizine (ZYRTEC) 10 MG tablet Take 10 mg by mouth daily.   Yes  Historical Provider, MD  clonazePAM (KLONOPIN) 2 MG tablet TAKE 2 TABLETS BY MOUTH AT BEDTIME AS NEEDED FOR ANXIETY Patient taking differently: Take 2 mg by mouth at bedtime. TAKE 2 TABLETS BY MOUTH AT BEDTIME AS NEEDED FOR ANXIETY 05/09/15  Yes [name], MD  cyclobenzaprine (FLEXERIL) 10 MG tablet Take 4 tablets by mouth at  bedtime 05/09/15  Yes [name], MD  docusate sodium (COLACE) 100 MG capsule Take 100 mg by mouth 2 (two) times daily.   Yes Historical Provider, MD  lisinopril-hydrochlorothiazide (PRINZIDE,ZESTORETIC) 20-12.5 MG tablet Take 1 tablet by mouth  every evening 05/09/15  Yes [name], MD  meclizine (ANTIVERT) 25 MG tablet Take 1 tablet (25 mg total) by mouth 3 (three) times daily as needed for dizziness. 10/03/15  Yes Deno Etienne, DO  Multiple Vitamin (MULTIVITAMIN) tablet Take 1 tablet by mouth daily.   Yes Historical Provider, MD  naproxen sodium (ANAPROX) 220 MG tablet Take 440 mg by mouth 2 (two) times daily with a meal.   Yes Historical Provider, MD  pantoprazole (PROTONIX) 40 MG tablet TAKE 1 TABLET (40 MG TOTAL) BY MOUTH DAILY. 08/26/15  Yes [name], MD  Probiotic Product (PROBIOTIC DAILY PO) Take 1  tablet by mouth daily.    Yes Historical Provider, MD  traMADol (ULTRAM) 50 MG tablet Take 1 tablet (50 mg total) by mouth 2 (two) times daily. 05/09/15  Yes [name], MD  traZODone (DESYREL) 100 MG tablet Take 2 tablets by mouth at  bedtime 05/09/15  Yes [name], MD  Vitamin D-Vitamin K (VITAMIN K2-VITAMIN D3) 45-2000 MCG-UNIT CAPS Take 1 capsule by mouth daily.   Yes Historical Provider, MD  zoster vaccine live, PF, (ZOSTAVAX) 16109 UNT/0.65ML injection Inject 19,400 Units into the skin once. 05/09/15  Yes [name], MD   Allergies  Allergen Reactions  . Codeine Itching  . Hydrocodone Itching   Review of Systems  Constitutional: Negative for chills, fatigue and fever.  Gastrointestinal: Negative for abdominal pain, constipation, diarrhea, nausea and  vomiting.  Genitourinary: Negative for dysuria, frequency and urgency.  Neurological: Negative for dizziness and light-headedness.       Objective:   Physical Exam  Constitutional: He is oriented to person, place, and time. He appears well-developed and well-nourished. No distress.  HENT:  Head: Normocephalic and atraumatic.  Eyes: EOM are normal. Pupils are equal, round, and reactive to light.  Neck: Neck supple.  Cardiovascular: Normal rate, regular rhythm, S1 normal, S2 normal and normal heart sounds.   No murmur heard. Pulmonary/Chest: Effort normal and breath sounds normal. No respiratory distress. He has no wheezes.  Musculoskeletal: Normal range of motion.  Neurological: He is alert and oriented to person, place, and time.  Skin: Skin is warm and dry.  Psychiatric: He has a normal mood and affect. His behavior is normal.  Nursing note and vitals reviewed.   BP 122/80 (BP Location: Left Arm, Patient Position: Sitting, Cuff Size: Large)   Pulse 79   Temp 98.6 F (37 C) (Oral)   Resp 16   Ht 5\' 5"  (1.651 m)   Wt 206 lb 6.4 oz (93.6 kg)   SpO2 98%   BMI 34.35 kg/m     Assessment & Plan:   1. Dehydration   All sxs resolved, pt requesting release back to work w/o restriction or limitations.   Orders Placed This Encounter  Procedures  . BASIC METABOLIC PANEL WITH GFR    I personally performed the services described in this documentation, which was scribed in my presence. The recorded information has been reviewed and considered, and addended by me as needed.   Delman Cheadle, M.D.  [facility] [address] [place], [place] [address] [phone] phone [phone] fax  10/23/15 10:32 PM

## 2015-11-26 ENCOUNTER — Other Ambulatory Visit: Payer: Self-pay | Admitting: Emergency Medicine

## 2015-11-27 NOTE — Telephone Encounter (Signed)
We can do one months worth.  He will need to establish with a new provider for future refills. Philis Fendt, MS, PA-C 2:15 PM, 11/27/2015

## 2015-11-27 NOTE — Telephone Encounter (Signed)
Dr Everlene Farrier, pt's last OV w/you 3/19 at which time you wrote 6 mos of RFs of clonazepam.

## 2015-11-29 NOTE — Telephone Encounter (Signed)
Called in 1 mos Rf and notified pt on Vm of need to est care w/new provider for more.

## 2016-01-14 ENCOUNTER — Ambulatory Visit (INDEPENDENT_AMBULATORY_CARE_PROVIDER_SITE_OTHER): Payer: BC Managed Care – PPO | Admitting: Urgent Care

## 2016-01-14 VITALS — BP 110/74 | HR 97 | Temp 98.2°F | Ht 65.0 in | Wt 202.0 lb

## 2016-01-14 DIAGNOSIS — Z760 Encounter for issue of repeat prescription: Secondary | ICD-10-CM

## 2016-01-14 DIAGNOSIS — Z23 Encounter for immunization: Secondary | ICD-10-CM

## 2016-01-14 MED ORDER — ZOSTER VACCINE LIVE 19400 UNT/0.65ML ~~LOC~~ SUSR: 0.6500 mL | Freq: Once | SUBCUTANEOUS | 0 refills | Status: AC

## 2016-01-14 NOTE — Patient Instructions (Signed)
     IF you received an x-ray today, you will receive an invoice from [facility]. Please contact [facility] at [phone] with questions or concerns regarding your invoice.   IF you received labwork today, you will receive an invoice from [facility]. Please contact [facility] at [phone] with questions or concerns regarding your invoice.   Our billing staff will not be able to assist you with questions regarding bills from these companies.  You will be contacted with the lab results as soon as they are available. The fastest way to get your results is to activate your My Chart account. Instructions are located on the last page of this paperwork. If you have not heard from us regarding the results in 2 weeks, please contact this office.      

## 2016-01-14 NOTE — Progress Notes (Signed)
    MRN: [id number] DOB: [birth year]  Subjective:   Casey Small Sr. is a 60 y.o. male presenting for chief complaint of Medication Refill (CLONAZEPAM) and Flu Vaccine  Patient is currently taking Klonopin 4mg  nightly. He is presenting for a medication refill for 60 days. He states that he has been on this medication for 20 years and has always gotten this dose. He denies drowsiness, sedation, mood disturbance. He admits that he has tried to cut back on his Klonopin dosage in the past but has been unsuccessful due to withdrawal effects. He had received a refill 11/29/2015 and was advised that he needed to establish care with a different provider. He has not done this, was thinking to set up his care with a different provider at the start of the year.  Casey [name] has a current medication list which includes the following prescription(s): vitamin c, b complex vitamins, cetirizine, clonazepam, cyclobenzaprine, docusate sodium, lisinopril-hydrochlorothiazide, meclizine, multivitamin, naproxen sodium, pantoprazole, probiotic product, tramadol, trazodone, vitamin k2-vitamin d3, and zoster vaccine live (pf). Also is allergic to codeine and hydrocodone.  Casey [name]  has a past medical history of Allergy; Arthritis; Cancer ([place]); Chronic kidney disease; Circadian rhythm sleep disorder, shift work type (07/17/2013); Complication of anesthesia; COPD (chronic obstructive pulmonary disease) ([place]); GERD (gastroesophageal reflux disease); Hypertension; Insomnia secondary to restless leg syndrome (07/17/2013); Kidney stones; OSA on CPAP; PONV (postoperative nausea and vomiting); and Sleep apnea. Also  has a past surgical history that includes Tonsillectomy; varicellectomy; meniscul; acl repair left knee; Rotator cuff repair; ligaments repair left wrist ; fusion great toe right foot; tendons cut on left heel; Cholecystectomy; stone extractions and urethral stents; Cardiac catheterization (2005); skin cancer removed from left cheek;  Cystoscopy/retrograde/ureteroscopy/stone extraction with basket (04/07/2012); and Colonoscopy (2005).   Objective:   Vitals: BP 110/74 (BP Location: Right Arm, Patient Position: Sitting, Cuff Size: Small)   Pulse 97   Temp 98.2 F (36.8 C) (Oral)   Ht 5\' 5"  (1.651 m)   Wt 202 lb (91.6 kg)   SpO2 100%   BMI 33.61 kg/m   Physical Exam  Constitutional: He is oriented to person, place, and time. He appears well-developed and well-nourished.  Cardiovascular: Normal rate.   Pulmonary/Chest: Effort normal.  Neurological: He is alert and oriented to person, place, and time.  Psychiatric: His mood appears anxious. His affect is not blunt and not labile. His speech is not rapid and/or pressured, not delayed, not tangential and not slurred. He is agitated. He is not aggressive, not hyperactive, not slowed, not withdrawn and not combative. He does not exhibit a depressed mood. He is communicative.   Assessment and Plan :   1. Encounter for medication refill - Discussed at length the risks and long term consequences of using benzodiazepines at high doses. I recommended patient establish with a provider that was comfortable managing this medication at a high dose. I offered a 30 day supply but he became upset and agitated stating that he want a script to last him through to next year. Patient left without a prescription. I will forward this note to our medical director for her review.  2. Need for prophylactic vaccination and inoculation against influenza - Changed his mind about his influenza vaccine since he did not get his Klonopin refills the way he wanted.  3. Need for shingles vaccine - Left without receiving his script.  Jaynee Eagles, PA-C [facility] [phone] 01/14/2016 5:58 PM

## 2016-01-18 ENCOUNTER — Ambulatory Visit (INDEPENDENT_AMBULATORY_CARE_PROVIDER_SITE_OTHER): Payer: BC Managed Care – PPO | Admitting: Family Medicine

## 2016-01-18 VITALS — BP 122/72 | HR 94 | Temp 98.9°F | Resp 17 | Ht 66.5 in | Wt 201.0 lb

## 2016-01-18 DIAGNOSIS — F5101 Primary insomnia: Secondary | ICD-10-CM | POA: Diagnosis not present

## 2016-01-18 DIAGNOSIS — Z23 Encounter for immunization: Secondary | ICD-10-CM | POA: Diagnosis not present

## 2016-01-18 DIAGNOSIS — I10 Essential (primary) hypertension: Secondary | ICD-10-CM | POA: Diagnosis not present

## 2016-01-18 DIAGNOSIS — G4733 Obstructive sleep apnea (adult) (pediatric): Secondary | ICD-10-CM

## 2016-01-18 DIAGNOSIS — G4726 Circadian rhythm sleep disorder, shift work type: Secondary | ICD-10-CM

## 2016-01-18 MED ORDER — CLONAZEPAM 2 MG PO TABS: 4.0000 mg | ORAL_TABLET | Freq: Every day | ORAL | 1 refills | Status: DC

## 2016-01-18 MED ORDER — VARICELLA-ZOSTER IMMUNE GLOB 125 UNIT/1.2ML IM SOLN: 1.0000 | Freq: Once | INTRAMUSCULAR | 0 refills | Status: AC

## 2016-01-18 NOTE — Patient Instructions (Addendum)
     IF you received an x-ray today, you will receive an invoice from [facility]. Please contact [facility] at [phone] with questions or concerns regarding your invoice.   IF you received labwork today, you will receive an invoice from [employer]. Please contact [facility] at [phone] with questions or concerns regarding your invoice.   Our billing staff will not be able to assist you with questions regarding bills from these companies.  You will be contacted with the lab results as soon as they are available. The fastest way to get your results is to activate your My Chart account. Instructions are located on the last page of this paperwork. If you have not heard from [place] regarding the results in 2 weeks, please contact this office.      Sleep Apnea Sleep apnea is a condition that affects breathing. People with sleep apnea have moments during sleep when their breathing pauses briefly or gets shallow. Sleep apnea can cause these symptoms:  Trouble staying asleep.  Sleepiness or tiredness during the day.  Irritability.  Loud snoring.  Morning headaches.  Trouble concentrating.  Forgetting things.  Less interest in sex.  Being sleepy for no reason.  Mood swings.  Personality changes.  Depression.  Waking up a lot during the night to pee (urinate).  Dry mouth.  Sore throat. Follow these instructions at home:  Make any changes in your routine that your doctor recommends.  Eat a healthy, well-balanced diet.  Take over-the-counter and prescription medicines only as told by your doctor.  Avoid using alcohol, calming medicines (sedatives), and narcotic medicines.  Take steps to lose weight if you are overweight.  If you were given a machine (device) to use while you sleep, use it only as told by your doctor.  Do not use any tobacco products, such as cigarettes, chewing tobacco, and e-cigarettes. If you need help  quitting, ask your doctor.  Keep all follow-up visits as told by your doctor. This is important. Contact a doctor if:  The machine that you were given to use during sleep is uncomfortable or does not seem to be working.  Your symptoms do not get better.  Your symptoms get worse. Get help right away if:  Your chest hurts.  You have trouble breathing in enough air (shortness of breath).  You have an uncomfortable feeling in your back, arms, or stomach.  You have trouble talking.  One side of your body feels weak.  A part of your face is hanging down (drooping). These symptoms may be an emergency. Do not wait to see if the symptoms will go away. Get medical help right away. Call your local emergency services (911 in the [place] Do not drive yourself to the hospital.  This information is not intended to replace advice given to you by your health care provider. Make sure you discuss any questions you have with your health care provider. Document Released: 11/26/2007 Document Revised: 10/13/2015 Document Reviewed: 11/26/2014 Elsevier Interactive Patient Education  2017 [employer].

## 2016-01-18 NOTE — Progress Notes (Signed)
Chief Complaint  Patient presents with  . Medication Refill    clonazepam,  . Immunizations    flu shot     HPI  This is a patient of Dr. 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 He uses his CPAP nightly and has been taking Clonazepam nightly for sleep.  He has been taking clonazepam for 22 years. He reports that in the mornings he feels referred. He states that his last clonazepam has been on 1mg  to make his prescriptions last and he is already having perioral numbness and nausea.  He states that he now lives on the [place] side of town and will be transferring to the [place].   Hypertension Pt reports that he has been compliant with his meds He has been adding extra salt to foods He notices that without eating extra salt he gets muscle cramps  BP Readings from Last 3 Encounters:  01/18/16 122/72  01/14/16 110/74  10/16/15 122/80      Past Medical History:  Diagnosis Date  . Allergy    seasonal  . Arthritis    joints  . Cancer ([place])    skin  . Chronic kidney disease    multiple renal stone  . Circadian rhythm sleep disorder, shift work type 07/17/2013  . Complication of anesthesia    nausea and vomiting  . COPD (chronic obstructive pulmonary disease) ([place])    pt denies  . GERD (gastroesophageal reflux disease)   . Hypertension   . Insomnia secondary to restless leg syndrome 07/17/2013  . Kidney stones   . OSA on CPAP   . PONV (postoperative nausea and vomiting)   . Sleep apnea    cpap    Current Outpatient Prescriptions  Medication Sig Dispense Refill  . Ascorbic Acid (VITAMIN C) 100 MG tablet Take 100 mg by mouth daily.    . B Complex Vitamins (VITAMIN-B COMPLEX PO) Take 1 tablet by mouth daily.    . cetirizine (ZYRTEC) 10 MG tablet Take 10 mg by mouth daily.    . clonazePAM (KLONOPIN) 2 MG tablet Take 2 tablets (4 mg total) by  mouth at bedtime. 60 tablet 1  . cyclobenzaprine (FLEXERIL) 10 MG tablet Take 4 tablets by mouth at  bedtime 360 tablet 2  . docusate sodium (COLACE) 100 MG capsule Take 100 mg by mouth 2 (two) times daily.    Marland Kitchen lisinopril-hydrochlorothiazide (PRINZIDE,ZESTORETIC) 20-12.5 MG tablet Take 1 tablet by mouth  every evening 90 tablet 2  . meclizine (ANTIVERT) 25 MG tablet Take 1 tablet (25 mg total) by mouth 3 (three) times daily as needed for dizziness. 30 tablet 0  . Multiple Vitamin (MULTIVITAMIN) tablet Take 1 tablet by mouth daily.    . naproxen sodium (ANAPROX) 220 MG tablet Take 440 mg by mouth 2 (two) times daily with a meal.    . pantoprazole (PROTONIX) 40 MG tablet TAKE 1 TABLET (40 MG TOTAL) BY MOUTH DAILY. 90 tablet 2  . Probiotic Product (PROBIOTIC DAILY PO) Take 1 tablet by mouth daily.     . traMADol (ULTRAM) 50 MG tablet Take 1 tablet (50 mg total) by mouth 2 (two) times daily. 180 tablet 1  . traZODone (DESYREL) 100 MG tablet Take 2 tablets by mouth at  bedtime 180 tablet 2  . Vitamin D-Vitamin K (VITAMIN K2-VITAMIN D3) 45-2000 MCG-UNIT CAPS Take 1  capsule by mouth daily.     No current facility-administered medications for this visit.     Allergies:  Allergies  Allergen Reactions  . Codeine Itching  . Hydrocodone Itching    Past Surgical History:  Procedure Laterality Date  . acl repair left knee    . CARDIAC CATHETERIZATION  2005   negative   . CHOLECYSTECTOMY    . COLONOSCOPY  2005  . CYSTOSCOPY/RETROGRADE/URETEROSCOPY/STONE EXTRACTION WITH BASKET  04/07/2012   Procedure: CYSTOSCOPY/RETROGRADE/URETEROSCOPY/STONE EXTRACTION WITH BASKET;  Surgeon: Casey So, MD;  Location: [facility];  Service: Urology;  Laterality: Bilateral;  cystoscopy, bilateral stent removal, bilateral ureteroscopy with bilateral stone extraction and insertion of right ureteral stent  . fusion great toe right foot    . ligaments repair left wrist     . meniscul    . ROTATOR CUFF REPAIR     left  shoulder   . skin cancer removed from left cheek    . stone extractions and urethral stents     multiple  . tendons cut on left heel    . TONSILLECTOMY    . varicellectomy      Social History   Social History  . Marital status: Married    Spouse name: Casey [name]  . Number of children: 2  . Years of education: 12+    Occupational History  . 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 Smoking status: Never Smoker  . Smokeless tobacco: Never Used  . Alcohol use No  . Drug use: No  . Sexual activity: Yes   Other Topics Concern  . 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   Patient is a retired Agricultural consultant.   Patient has two children.   Patient has 5 years of [employer].   Patient is right-handed.   Patient drinks very little caffeine.    ROS  Objective: Vitals:   01/18/16 1059  BP: 122/72  Pulse: 94  Resp: 17  Temp: 98.9 F (37.2 C)  TempSrc: Oral  SpO2: 98%  Weight: 201 lb (91.2 kg)  Height: 5' 6.5" (1.689 m)    Physical Exam  Constitutional: He appears well-developed and well-nourished.  HENT:  Head: Normocephalic and atraumatic.  Eyes: Conjunctivae and EOM are normal.  Cardiovascular: Normal rate, regular rhythm, normal heart sounds and intact distal pulses.   Pulmonary/Chest: Effort normal and breath sounds normal. No respiratory distress. He has no wheezes.    Assessment and Plan Casey [name] was seen today for medication refill and immunizations.  Diagnoses and all orders for this visit:  Shift work sleep disorder Primary insomnia OSA (obstructive sleep apnea)  Pt given clonazepam 4mg  to take at bedtime for shift work sleep disorder Gave him one refill. Discussed the importance of CPAP compliance. Pt verbalized understanding. Discussed that since he has been on this medication for a long time he will withdraw and there is a seizure risk He should discuss this medication with his new PCP  at [place] -     clonazePAM (KLONOPIN) 2 MG tablet; Take 2 tablets (4 mg total) by mouth at bedtime.  Essential Hypertension Continue current medications   Casey [name] A Casey [name]

## 2016-01-21 ENCOUNTER — Other Ambulatory Visit: Payer: Self-pay

## 2016-01-21 ENCOUNTER — Other Ambulatory Visit: Payer: Self-pay | Admitting: Emergency Medicine

## 2016-01-21 NOTE — Telephone Encounter (Signed)
PT is calling and was seen on Saturday by Dr. Nolon Rod and didn't realize he was out of the tramadol and would like that refilled she refilled his others   Contact: [phone]

## 2016-01-21 NOTE — Telephone Encounter (Signed)
Dr Nolon Rod, your OV plan says to continue same meds, but I don't see tramadol discussed. Please advise.

## 2016-01-21 NOTE — Telephone Encounter (Signed)
It looks like a chronic medication. Please tell him for chronic pain medications he will need to return to clinic.

## 2016-01-24 ENCOUNTER — Other Ambulatory Visit: Payer: Self-pay | Admitting: Family Medicine

## 2016-01-24 NOTE — Telephone Encounter (Signed)
Advised pt he will need to RTC to be eval for the tramadol. Pt voiced understanding and will probably wait to discuss w/his new PCP in Jan.

## 2016-02-04 ENCOUNTER — Ambulatory Visit (INDEPENDENT_AMBULATORY_CARE_PROVIDER_SITE_OTHER): Payer: BC Managed Care – PPO | Admitting: Family Medicine

## 2016-02-04 VITALS — BP 140/92 | HR 94 | Temp 98.4°F | Resp 17 | Ht 66.5 in | Wt 204.0 lb

## 2016-02-04 DIAGNOSIS — Z029 Encounter for administrative examinations, unspecified: Secondary | ICD-10-CM

## 2016-02-04 NOTE — Progress Notes (Signed)
Chief Complaint  Patient presents with  . Medical Clearance    To donate plasma. Has form to be filled out.     HPI  Here to get form for [facility] Due to his clonazepam he was rejected and needed medical clearance Discussed that he would ike to donate plasma   Past Medical History:  Diagnosis Date  . Allergy    seasonal  . Arthritis    joints  . Cancer ([place])    skin  . Chronic kidney disease    multiple renal stone  . Circadian rhythm sleep disorder, shift work type 07/17/2013  . Complication of anesthesia    nausea and vomiting  . COPD (chronic obstructive pulmonary disease) ([place])    pt denies  . GERD (gastroesophageal reflux disease)   . Hypertension   . Insomnia secondary to restless leg syndrome 07/17/2013  . Kidney stones   . OSA on CPAP   . PONV (postoperative nausea and vomiting)   . Sleep apnea    cpap    Current Outpatient Prescriptions  Medication Sig Dispense Refill  . Ascorbic Acid (VITAMIN C) 100 MG tablet Take 100 mg by mouth daily.    . B Complex Vitamins (VITAMIN-B COMPLEX PO) Take 1 tablet by mouth daily.    . cetirizine (ZYRTEC) 10 MG tablet Take 10 mg by mouth daily.    . clonazePAM (KLONOPIN) 2 MG tablet Take 2 tablets (4 mg total) by mouth at bedtime. 60 tablet 1  . cyclobenzaprine (FLEXERIL) 10 MG tablet Take 4 tablets by mouth at  bedtime 360 tablet 2  . docusate sodium (COLACE) 100 MG capsule Take 100 mg by mouth 2 (two) times daily.    Marland Kitchen lisinopril-hydrochlorothiazide (PRINZIDE,ZESTORETIC) 20-12.5 MG tablet Take 1 tablet by mouth  every evening 90 tablet 2  . meclizine (ANTIVERT) 25 MG tablet Take 1 tablet (25 mg total) by mouth 3 (three) times daily as needed for dizziness. 30 tablet 0  . Multiple Vitamin (MULTIVITAMIN) tablet Take 1 tablet by mouth daily.    . naproxen sodium (ANAPROX) 220 MG tablet Take 440 mg by mouth 2 (two) times daily with a meal.    . pantoprazole (PROTONIX) 40 MG tablet TAKE 1 TABLET (40 MG TOTAL) BY  MOUTH DAILY. 90 tablet 2  . Probiotic Product (PROBIOTIC DAILY PO) Take 1 tablet by mouth daily.     . traMADol (ULTRAM) 50 MG tablet Take 1 tablet (50 mg total) by mouth 2 (two) times daily. 180 tablet 1  . traZODone (DESYREL) 100 MG tablet Take 2 tablets by mouth at  bedtime 180 tablet 2  . Vitamin D-Vitamin K (VITAMIN K2-VITAMIN D3) 45-2000 MCG-UNIT CAPS Take 1 capsule by mouth daily.     No current facility-administered medications for this visit.     Allergies:  Allergies  Allergen Reactions  . Codeine Itching  . Hydrocodone Itching    Past Surgical History:  Procedure Laterality Date  . acl repair left knee    . CARDIAC CATHETERIZATION  2005   negative   . CHOLECYSTECTOMY    . COLONOSCOPY  2005  . CYSTOSCOPY/RETROGRADE/URETEROSCOPY/STONE EXTRACTION WITH BASKET  04/07/2012   Procedure: CYSTOSCOPY/RETROGRADE/URETEROSCOPY/STONE EXTRACTION WITH BASKET;  Surgeon: Malka So, MD;  Location: [facility];  Service: Urology;  Laterality: Bilateral;  cystoscopy, bilateral stent removal, bilateral ureteroscopy with bilateral stone extraction and insertion of right ureteral stent  . fusion great toe right foot    . ligaments repair left wrist     .  meniscul    . ROTATOR CUFF REPAIR     left shoulder   . skin cancer removed from left cheek    . stone extractions and urethral stents     multiple  . tendons cut on left heel    . TONSILLECTOMY    . varicellectomy      Social History   Social History  . Marital status: Married    Spouse name: [name]  . Number of children: 2  . Years of education: 12+    Occupational History  . 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 Smoking status: Never Smoker  . Smokeless tobacco: Never Used  . Alcohol use No  . Drug use: No  . Sexual activity: Yes   Other Topics Concern  . 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   Patient is a retired Agricultural consultant.   Patient has two  children.   Patient has 5 years of [employer].   Patient is right-handed.   Patient drinks very little caffeine.    Review of Systems  Constitutional: Negative for chills and fever.    Objective: Vitals:   02/04/16 1535  BP: (!) 140/92  Pulse: 94  Resp: 17  Temp: 98.4 F (36.9 C)  TempSrc: Oral  SpO2: 94%  Weight: 204 lb (92.5 kg)  Height: 5' 6.5" (1.689 m)    Physical Exam  Constitutional: He is oriented to person, place, and time. He appears well-developed and well-nourished.  HENT:  Head: Normocephalic and atraumatic.  Eyes: Conjunctivae and EOM are normal.  Pulmonary/Chest: Effort normal.  Neurological: He is alert and oriented to person, place, and time.    Assessment and Plan Bibb was seen today for medical clearance.  Diagnoses and all orders for this visit:  Administrative encounter- completed form for plasma donation      Zoe A Nolon Rod

## 2016-02-04 NOTE — Patient Instructions (Signed)
     IF you received an x-ray today, you will receive an invoice from [facility]. Please contact [facility] at [phone] with questions or concerns regarding your invoice.   IF you received labwork today, you will receive an invoice from [facility]. Please contact [facility] at [phone] with questions or concerns regarding your invoice.   Our billing staff will not be able to assist you with questions regarding bills from these companies.  You will be contacted with the lab results as soon as they are available. The fastest way to get your results is to activate your My Chart account. Instructions are located on the last page of this paperwork. If you have not heard from us regarding the results in 2 weeks, please contact this office.      

## 2016-02-05 ENCOUNTER — Other Ambulatory Visit: Payer: Self-pay | Admitting: Emergency Medicine

## 2016-02-05 NOTE — Telephone Encounter (Signed)
Last ov 01/2016 Last refill 05/2015 with 2 additional

## 2016-03-12 ENCOUNTER — Ambulatory Visit: Payer: Self-pay | Admitting: Family Medicine

## 2016-03-18 ENCOUNTER — Ambulatory Visit: Payer: Self-pay | Admitting: Internal Medicine

## 2016-03-20 ENCOUNTER — Other Ambulatory Visit: Payer: Self-pay | Admitting: Family Medicine

## 2016-03-20 NOTE — Telephone Encounter (Signed)
01/2016 last ov and refill 

## 2016-03-21 NOTE — Telephone Encounter (Signed)
Faxed to pharmacy - pt notified via phone

## 2016-03-24 ENCOUNTER — Encounter: Payer: Self-pay | Admitting: Internal Medicine

## 2016-03-24 ENCOUNTER — Ambulatory Visit (INDEPENDENT_AMBULATORY_CARE_PROVIDER_SITE_OTHER): Payer: BC Managed Care – PPO | Admitting: Internal Medicine

## 2016-03-24 VITALS — BP 126/82 | HR 83 | Temp 98.2°F | Wt 206.0 lb

## 2016-03-24 DIAGNOSIS — K2100 Gastro-esophageal reflux disease with esophagitis, without bleeding: Secondary | ICD-10-CM

## 2016-03-24 DIAGNOSIS — Z9989 Dependence on other enabling machines and devices: Secondary | ICD-10-CM

## 2016-03-24 DIAGNOSIS — I10 Essential (primary) hypertension: Secondary | ICD-10-CM | POA: Diagnosis not present

## 2016-03-24 DIAGNOSIS — G4733 Obstructive sleep apnea (adult) (pediatric): Secondary | ICD-10-CM | POA: Diagnosis not present

## 2016-03-24 DIAGNOSIS — G4726 Circadian rhythm sleep disorder, shift work type: Secondary | ICD-10-CM | POA: Diagnosis not present

## 2016-03-24 DIAGNOSIS — M159 Polyosteoarthritis, unspecified: Secondary | ICD-10-CM

## 2016-03-24 DIAGNOSIS — M150 Primary generalized (osteo)arthritis: Secondary | ICD-10-CM

## 2016-03-24 DIAGNOSIS — K219 Gastro-esophageal reflux disease without esophagitis: Secondary | ICD-10-CM | POA: Insufficient documentation

## 2016-03-24 MED ORDER — CYCLOBENZAPRINE HCL 10 MG PO TABS: 30.0000 mg | ORAL_TABLET | Freq: Every day | ORAL | 0 refills | Status: DC

## 2016-03-24 MED ORDER — TRAMADOL HCL 50 MG PO TABS: 50.0000 mg | ORAL_TABLET | Freq: Two times a day (BID) | ORAL | 0 refills | Status: DC | PRN

## 2016-03-24 MED ORDER — CLONAZEPAM 2 MG PO TABS: 4.0000 mg | ORAL_TABLET | Freq: Every day | ORAL | 0 refills | Status: DC

## 2016-03-24 MED ORDER — LISINOPRIL-HYDROCHLOROTHIAZIDE 20-12.5 MG PO TABS
ORAL_TABLET | ORAL | 3 refills | Status: DC
Start: 2016-03-24 — End: 2017-02-15

## 2016-03-24 MED ORDER — PANTOPRAZOLE SODIUM 40 MG PO TBEC: DELAYED_RELEASE_TABLET | ORAL | 3 refills | Status: DC

## 2016-03-24 NOTE — Assessment & Plan Note (Signed)
Asked him to change tramadol to prn

## 2016-03-24 NOTE — Assessment & Plan Note (Signed)
No symptoms on the PPI 

## 2016-03-24 NOTE — Progress Notes (Signed)
Pre visit review using our clinic review tool, if applicable. No additional management support is needed unless otherwise documented below in the visit note. 

## 2016-03-24 NOTE — Assessment & Plan Note (Signed)
Continues on the CPAP

## 2016-03-24 NOTE — Patient Instructions (Signed)
Please cut the cyclobenzaprine to 30mg  at bedtime. If you don't notice any change in 1 week, reduce it to 20mg  and if possible cut it down to 10mg  before next month's appointment.

## 2016-03-24 NOTE — Progress Notes (Signed)
Subjective:    Patient ID: Casey [name]., male    DOB: 1955/07/09, 61 y.o.   MRN: [id number]  HPI Here to establish care Had been seeing Dr Daub---who retired. 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) Sleeps with CPAP for many years---long standing problems (though 2015 sleep study showed AHI of only 11)  Uses 40mg  of cyclobenzaprine, 4mg  of clonazepam and 200mg  of trazodone nightly Has been on these meds for 20 years---has not tolerated trying to go down on these He feels the trazodone is "affecting my eyes"--over the past month  History of HTN No problems with this med Cardiac cath ~13 years ago was benign  Multiple kidney stones and procedures Calcium stones No preventative--he drinks homemade lemonade Sees Dr Jeffie Pollock  Current Outpatient Prescriptions on File Prior to Visit  Medication Sig Dispense Refill  . Ascorbic Acid (VITAMIN C) 100 MG tablet Take 100 mg by mouth daily.    . B Complex Vitamins (VITAMIN-B COMPLEX PO) Take 1 tablet by mouth daily.    . cetirizine (ZYRTEC) 10 MG tablet Take 10 mg by mouth daily.    . clonazePAM (KLONOPIN) 2 MG tablet TAKE 2 TABLETS BY MOUTH AT BEDTIME 60 tablet 1  . cyclobenzaprine (FLEXERIL) 10 MG tablet TAKE 4 TABLETS BY MOUTH AT BEDTIME 360 tablet 2  . docusate sodium (COLACE) 100 MG capsule Take 100 mg by mouth 2 (two) times daily.    Marland Kitchen lisinopril-hydrochlorothiazide (PRINZIDE,ZESTORETIC) 20-12.5 MG tablet Take 1 tablet by mouth  every evening 90 tablet 2  . Multiple Vitamin (MULTIVITAMIN) tablet Take 1 tablet by mouth daily.    . naproxen sodium (ANAPROX) 220 MG tablet Take 440 mg by mouth 2 (two) times daily with a meal.    . pantoprazole (PROTONIX) 40 MG tablet TAKE 1 TABLET (40 MG TOTAL) BY MOUTH DAILY. 90 tablet 2  . Probiotic Product (PROBIOTIC DAILY  PO) Take 1 tablet by mouth daily.     . traMADol (ULTRAM) 50 MG tablet Take 1 tablet (50 mg total) by mouth 2 (two) times daily. 180 tablet 1  . traZODone (DESYREL) 100 MG tablet Take 2 tablets by mouth at  bedtime 180 tablet 2  . Vitamin D-Vitamin K (VITAMIN K2-VITAMIN D3) 45-2000 MCG-UNIT CAPS Take 1 capsule by mouth daily.     No current facility-administered medications on file prior to visit.     Allergies  Allergen Reactions  . Codeine Itching  . Hydrocodone Itching    Past Medical History:  Diagnosis Date  . Allergy    seasonal  . Arthritis    mild  . Cancer ([place])    skin  . Circadian rhythm sleep disorder, shift work type 07/17/2013  . Complication of anesthesia    nausea and vomiting  . GERD (gastroesophageal reflux disease)   . Hypertension   . Insomnia secondary to restless leg syndrome 07/17/2013  . Kidney stones   . OSA on CPAP   . PONV (postoperative nausea and vomiting)   . Sleep apnea    cpap    Past Surgical History:  Procedure Laterality Date  . acl repair left knee    . CARDIAC CATHETERIZATION  2005   negative   . CHOLECYSTECTOMY    . CYSTOSCOPY/RETROGRADE/URETEROSCOPY/STONE EXTRACTION WITH BASKET  04/07/2012   Procedure: CYSTOSCOPY/RETROGRADE/URETEROSCOPY/STONE EXTRACTION WITH BASKET;  Surgeon:  Malka So, MD;  Location: [facility];  Service: Urology;  Laterality: Bilateral;  cystoscopy, bilateral stent removal, bilateral ureteroscopy with bilateral stone extraction and insertion of right ureteral stent  . fusion great toe right foot    . ligaments repair left wrist     . ROTATOR CUFF REPAIR     left shoulder   . skin cancer removed from left cheek    . stone extractions and urethral stents     multiple  . tendons cut on left heel    . TONSILLECTOMY    . VARICOCELE EXCISION      Family History  Problem Relation Age of Onset  . Cancer Mother     bladder cancer  . Heart disease Mother     CABG  . Cancer Father     lung cancer after asbestosis    . Hypertension Father   . Diabetes Brother   . Colon cancer Neg Hx     Social History   Social History  . Marital status: Married    Spouse name: [name]  . Number of children: 2  . Years of education: 12+    Occupational History  . 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 Smoking status: Never Smoker  . Smokeless tobacco: Never Used  . Alcohol use No  . Drug use: No  . Sexual activity: Yes   Other Topics Concern  . 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   Patient is a retired Agricultural consultant.   Patient has two children.   Patient has 5 years of [employer].   Patient is right-handed.      Review of Systems  Constitutional: Negative for fatigue and unexpected weight change.  HENT: Negative for hearing loss and tinnitus.   Eyes: Negative for visual disturbance.  Respiratory: Negative for cough, chest tightness and shortness of breath.   Cardiovascular: Negative for chest pain and palpitations.  Gastrointestinal: Negative for blood in stool, constipation and nausea.       Heartburn controlled with PPI--no dysphagia  Endocrine: Negative for polydipsia and polyuria.  Genitourinary: Negative for dysuria and hematuria.       Has had elevated PSA's in past  Musculoskeletal: Positive for arthralgias.       Uses tramadol bid regularly  Skin: Negative for rash.       Sees dermatologist ~yearly Allyson Sabal)  Allergic/Immunologic: Positive for environmental allergies. Negative for immunocompromised state.       Satisfied with the certizine  Neurological: Negative for dizziness, syncope and light-headedness.  Hematological: Negative for adenopathy. Bruises/bleeds easily.  Psychiatric/Behavioral: Positive for sleep disturbance. Negative for dysphoric mood. The patient is not nervous/anxious.        Objective:   Physical Exam  Constitutional: He appears  well-developed and well-nourished. No distress.  HENT:  Mouth/Throat: Oropharynx is clear and moist. No oropharyngeal exudate.  Neck: Normal range of motion. Neck supple. No thyromegaly present.  Cardiovascular: Normal rate, regular rhythm, normal heart sounds and intact distal pulses.  Exam reveals no gallop.   No murmur heard. Pulmonary/Chest: Effort normal and breath sounds normal. No respiratory distress. He has no wheezes. He has no rales.  Abdominal: Soft. There is no tenderness.  Musculoskeletal: He exhibits no edema or tenderness.  Lymphadenopathy:    He has no cervical adenopathy.  Psychiatric: He has a normal mood  and affect. His behavior is normal.          Assessment & Plan:

## 2016-03-24 NOTE — Assessment & Plan Note (Signed)
Long standing with massive med needs Will try to find lowest effective dose May needs sleep evaluation Start with trying to wean cyclobenzaprine Reviewed CSRS--no concerns

## 2016-03-24 NOTE — Assessment & Plan Note (Signed)
BP Readings from Last 3 Encounters:  03/24/16 126/82  02/04/16 (!) 140/92  01/18/16 122/72   Okay on the current med

## 2016-04-27 ENCOUNTER — Encounter: Payer: Self-pay | Admitting: Internal Medicine

## 2016-04-27 ENCOUNTER — Ambulatory Visit (INDEPENDENT_AMBULATORY_CARE_PROVIDER_SITE_OTHER): Payer: BC Managed Care – PPO | Admitting: Internal Medicine

## 2016-04-27 VITALS — BP 126/80 | HR 96 | Temp 99.1°F | Wt 212.0 lb

## 2016-04-27 DIAGNOSIS — G4726 Circadian rhythm sleep disorder, shift work type: Secondary | ICD-10-CM

## 2016-04-27 MED ORDER — CYCLOBENZAPRINE HCL 10 MG PO TABS: 10.0000 mg | ORAL_TABLET | Freq: Every day | ORAL | 0 refills | Status: DC

## 2016-04-27 NOTE — Progress Notes (Signed)
Pre visit review using our clinic review tool, if applicable. No additional management support is needed unless otherwise documented below in the visit note. 

## 2016-04-27 NOTE — Progress Notes (Signed)
Subjective:    Patient ID: Casey Brunsvold., male    DOB: 09/12/1955, 61 y.o.   MRN: [id number]  HPI Here for follow up of sleep disorder He has been able to wean the cyclobenzaprine down to 20mg  daily Sleeping as poorly as ever Not generally tired upon awakening No daytime somnolence  Current Outpatient Prescriptions on File Prior to Visit  Medication Sig Dispense Refill  . Ascorbic Acid (VITAMIN C) 100 MG tablet Take 100 mg by mouth daily.    . B Complex Vitamins (VITAMIN-B COMPLEX PO) Take 1 tablet by mouth daily.    . cetirizine (ZYRTEC) 10 MG tablet Take 10 mg by mouth daily.    . clonazePAM (KLONOPIN) 2 MG tablet Take 2 tablets (4 mg total) by mouth at bedtime. 60 tablet 0  . cyclobenzaprine (FLEXERIL) 10 MG tablet Take 3 tablets (30 mg total) by mouth at bedtime. 270 tablet 0  . docusate sodium (COLACE) 100 MG capsule Take 100 mg by mouth 2 (two) times daily.    Marland Kitchen lisinopril-hydrochlorothiazide (PRINZIDE,ZESTORETIC) 20-12.5 MG tablet Take 1 tablet by mouth  every evening 90 tablet 3  . Multiple Vitamin (MULTIVITAMIN) tablet Take 1 tablet by mouth daily.    . naproxen sodium (ANAPROX) 220 MG tablet Take 440 mg by mouth 2 (two) times daily with a meal.    . pantoprazole (PROTONIX) 40 MG tablet TAKE 1 TABLET (40 MG TOTAL) BY MOUTH DAILY. 90 tablet 3  . Probiotic Product (PROBIOTIC DAILY PO) Take 1 tablet by mouth daily.     . traZODone (DESYREL) 100 MG tablet Take 2 tablets by mouth at  bedtime 180 tablet 2  . TURMERIC PO Take by mouth.    . Vitamin D-Vitamin K (VITAMIN K2-VITAMIN D3) 45-2000 MCG-UNIT CAPS Take 1 capsule by mouth daily.     No current facility-administered medications on file prior to visit.     Allergies  Allergen Reactions  . Codeine Itching  . Hydrocodone Itching    Past Medical History:  Diagnosis Date  . Allergy    seasonal  . Arthritis    mild  . Cancer ([place])    skin  . Circadian rhythm sleep disorder, shift work type 07/17/2013  .  Complication of anesthesia    nausea and vomiting  . GERD (gastroesophageal reflux disease)   . Hypertension   . Insomnia secondary to restless leg syndrome 07/17/2013  . Kidney stones   . OSA on CPAP   . PONV (postoperative nausea and vomiting)   . Sleep apnea    cpap    Past Surgical History:  Procedure Laterality Date  . acl repair left knee    . CARDIAC CATHETERIZATION  2005   negative   . CHOLECYSTECTOMY    . CYSTOSCOPY/RETROGRADE/URETEROSCOPY/STONE EXTRACTION WITH BASKET  04/07/2012   Procedure: CYSTOSCOPY/RETROGRADE/URETEROSCOPY/STONE EXTRACTION WITH BASKET;  Surgeon: Malka So, MD;  Location: [facility];  Service: Urology;  Laterality: Bilateral;  cystoscopy, bilateral stent removal, bilateral ureteroscopy with bilateral stone extraction and insertion of right ureteral stent  . fusion great toe right foot    . ligaments repair left wrist     . ROTATOR CUFF REPAIR     left shoulder   . skin cancer removed from left cheek    . stone extractions and urethral stents     multiple  . tendons cut on left heel    . TONSILLECTOMY    . VARICOCELE EXCISION      Family History  Problem  Relation Age of Onset  . Cancer Mother     bladder cancer  . Heart disease Mother     CABG  . Cancer Father     lung cancer after asbestosis  . Hypertension Father   . Diabetes Brother   . Colon cancer Neg Hx     Social History   Social History  . Marital status: Married    Spouse name: [name]  . Number of children: 2  . Years of education: 12+    Occupational History  . 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 Smoking status: Never Smoker  . Smokeless tobacco: Never Used  . Alcohol use No  . Drug use: No  . Sexual activity: Yes   Other Topics Concern  . 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   Patient is a retired Agricultural consultant.   Patient has two children.    Patient has 5 years of [employer].   Patient is right-handed.      Review of Systems Appetite is fine No other health concerns    Objective:   Physical Exam  Constitutional: He appears well-nourished. No distress.  Psychiatric: He has a normal mood and affect. His behavior is normal.          Assessment & Plan:

## 2016-04-27 NOTE — Assessment & Plan Note (Signed)
Has tolerated decreasing the cyclobenzaprine to 20mg  Will have him try to decrease to 10mg  if tolerated.  Then will work on clonazepam Try 3 mg at bedtime for 2 weeks--then down to 2mg  if tolerated

## 2016-04-27 NOTE — Patient Instructions (Signed)
Please change the cyclobenzaprine to 10mg  at bedtime. If you are doing okay in about 2 weeks, decrease the clonazepam to 3 mg at bedtime (1.5 tabs) for 2-3 weeks. Then try cutting down to 2mg . If you can still sleep okay at these doses, I will just see you in a year.

## 2016-05-04 ENCOUNTER — Ambulatory Visit: Payer: Self-pay

## 2016-05-04 ENCOUNTER — Other Ambulatory Visit: Payer: Self-pay | Admitting: Occupational Medicine

## 2016-05-04 DIAGNOSIS — G8929 Other chronic pain: Secondary | ICD-10-CM

## 2016-05-04 DIAGNOSIS — M545 Low back pain: Principal | ICD-10-CM

## 2016-05-15 ENCOUNTER — Other Ambulatory Visit: Payer: Self-pay | Admitting: Emergency Medicine

## 2016-05-31 ENCOUNTER — Encounter: Payer: Self-pay | Admitting: Internal Medicine

## 2016-06-01 MED ORDER — TRAZODONE HCL 100 MG PO TABS: ORAL_TABLET | ORAL | 3 refills | Status: DC

## 2016-06-01 MED ORDER — CLONAZEPAM 2 MG PO TABS: 4.0000 mg | ORAL_TABLET | Freq: Every day | ORAL | 0 refills | Status: DC

## 2016-06-01 NOTE — Telephone Encounter (Signed)
Clonazepam last printed 03-24-16 #60 Trazodone was written by previous provider 05/2015 #180/2  Last OV 04-27-16 No future OV

## 2016-06-01 NOTE — Telephone Encounter (Signed)
Approved: clonazepam #60 x 0 Trazodone okay for a year

## 2016-07-13 ENCOUNTER — Other Ambulatory Visit: Payer: Self-pay | Admitting: Internal Medicine

## 2016-07-13 NOTE — Telephone Encounter (Signed)
Approved: #60 x 0 

## 2016-07-13 NOTE — Telephone Encounter (Signed)
Left refill on voice mail at pharmacy  

## 2016-07-13 NOTE — Telephone Encounter (Signed)
Last filled 06-15-16 #60 Last OV 04-27-16 No Future OV

## 2016-08-13 ENCOUNTER — Other Ambulatory Visit: Payer: Self-pay | Admitting: Internal Medicine

## 2016-08-13 NOTE — Telephone Encounter (Signed)
Left refill on voice mail at pharmacy  

## 2016-08-13 NOTE — Telephone Encounter (Signed)
Last filled 07-14-16 #60 Last Ov 04-27-16 NO Future Ov  No UDS

## 2016-08-13 NOTE — Telephone Encounter (Signed)
Approved: #60 x 0 

## 2016-08-26 ENCOUNTER — Other Ambulatory Visit: Payer: Self-pay | Admitting: Internal Medicine

## 2016-08-26 NOTE — Telephone Encounter (Signed)
Called pt to verify the refill request for tramadol because it was off of his med list. He said he hurt his back in late February. He was given methocarbamol. He said he went back to taking 1 tramadol in the morning and 1 at night and stopped taking methocarbamol that was given to him. It seems to do better.

## 2016-08-27 NOTE — Telephone Encounter (Signed)
Approved: okay #15 x 0 1 tid prn Let him know that the new law does not allow [place] to give more than 5 days opiod med for acute injury (and theoretically I can't do it without a visit--but I know he has taken this before)

## 2016-08-27 NOTE — Telephone Encounter (Signed)
Left refill on voice mail at pharmacy Spoke to pt. He said he is in and out of doctors right now with his back. He will talk to the ortho about doing the rx because they are actively treating his back pain. He will tell [facility] not to fill it.

## 2016-09-15 ENCOUNTER — Other Ambulatory Visit: Payer: Self-pay | Admitting: Internal Medicine

## 2016-09-15 NOTE — Telephone Encounter (Signed)
Last filled 08-13-16 #60 Last OV 04-27-16 Sleep Disorder No Future OV

## 2016-09-15 NOTE — Telephone Encounter (Signed)
Left refill on voice mail at pharmacy  

## 2016-09-15 NOTE — Telephone Encounter (Signed)
Approved: #60 x 0 

## 2016-10-17 ENCOUNTER — Other Ambulatory Visit: Payer: Self-pay | Admitting: Internal Medicine

## 2016-10-19 NOTE — Telephone Encounter (Signed)
Ok to refill as requested 

## 2016-10-19 NOTE — Telephone Encounter (Signed)
Last filled 09-15-16 #60 Last OV 04-27-16 No Future OV  Forward to Dr Deborra Medina in Dr Alla German absence. Please send back to [facility] when approved/denied. Thanks

## 2016-10-19 NOTE — Telephone Encounter (Signed)
Left refill on voice mail at pharmacy  

## 2016-11-19 ENCOUNTER — Other Ambulatory Visit: Payer: Self-pay | Admitting: Internal Medicine

## 2016-11-19 NOTE — Telephone Encounter (Signed)
Approved: #60 x 0 Needs PE next February ---he may want to set this up to have it

## 2016-11-19 NOTE — Telephone Encounter (Signed)
Last filled 10-19-16 #60 Last OV 04-27-16 for sleep. No Future OV

## 2016-12-21 ENCOUNTER — Other Ambulatory Visit: Payer: Self-pay | Admitting: Internal Medicine

## 2016-12-21 NOTE — Telephone Encounter (Signed)
Approved: #60 x 0 

## 2016-12-21 NOTE — Telephone Encounter (Signed)
Received refill request electronically Last office visit 11/19/16 #60 Last office visit 04/27/16

## 2016-12-22 NOTE — Telephone Encounter (Signed)
Spoke to pt that rx was to be called in. Left refill on voice mail at pharmacy

## 2017-01-19 ENCOUNTER — Other Ambulatory Visit: Payer: Self-pay | Admitting: Internal Medicine

## 2017-01-20 NOTE — Telephone Encounter (Signed)
Left refill on voice mail at pharmacy  

## 2017-01-20 NOTE — Telephone Encounter (Signed)
Last Rx 12/22/16. Last OV 04/27/2016.

## 2017-01-20 NOTE — Telephone Encounter (Signed)
Okay to refill clonazepam #60, no refills.

## 2017-02-08 ENCOUNTER — Encounter: Payer: BC Managed Care – PPO | Admitting: Family Medicine

## 2017-02-09 ENCOUNTER — Encounter: Payer: BC Managed Care – PPO | Admitting: Family Medicine

## 2017-02-15 ENCOUNTER — Other Ambulatory Visit: Payer: Self-pay

## 2017-02-15 ENCOUNTER — Telehealth: Payer: Self-pay

## 2017-02-15 ENCOUNTER — Ambulatory Visit (INDEPENDENT_AMBULATORY_CARE_PROVIDER_SITE_OTHER): Payer: BC Managed Care – PPO | Admitting: Family Medicine

## 2017-02-15 ENCOUNTER — Encounter: Payer: Self-pay | Admitting: Family Medicine

## 2017-02-15 VITALS — BP 134/92 | HR 86 | Temp 98.4°F | Resp 17 | Ht 66.0 in | Wt 216.4 lb

## 2017-02-15 DIAGNOSIS — R3129 Other microscopic hematuria: Secondary | ICD-10-CM

## 2017-02-15 DIAGNOSIS — G4726 Circadian rhythm sleep disorder, shift work type: Secondary | ICD-10-CM

## 2017-02-15 DIAGNOSIS — R972 Elevated prostate specific antigen [PSA]: Secondary | ICD-10-CM

## 2017-02-15 DIAGNOSIS — Z13220 Encounter for screening for lipoid disorders: Secondary | ICD-10-CM

## 2017-02-15 DIAGNOSIS — Z0000 Encounter for general adult medical examination without abnormal findings: Secondary | ICD-10-CM

## 2017-02-15 DIAGNOSIS — F5101 Primary insomnia: Secondary | ICD-10-CM | POA: Diagnosis not present

## 2017-02-15 LAB — POCT URINALYSIS DIP (MANUAL ENTRY)
BILIRUBIN UA: NEGATIVE
GLUCOSE UA: NEGATIVE mg/dL
Ketones, POC UA: NEGATIVE mg/dL
Leukocytes, UA: NEGATIVE
NITRITE UA: NEGATIVE
Protein Ur, POC: NEGATIVE mg/dL
Spec Grav, UA: 1.025 (ref 1.010–1.025)
UROBILINOGEN UA: 0.2 U/dL
pH, UA: 6 (ref 5.0–8.0)

## 2017-02-15 LAB — POC MICROSCOPIC URINALYSIS (UMFC): MUCUS RE: ABSENT

## 2017-02-15 MED ORDER — CYCLOBENZAPRINE HCL 10 MG PO TABS: 10.0000 mg | ORAL_TABLET | Freq: Every day | ORAL | 1 refills | Status: DC

## 2017-02-15 MED ORDER — PANTOPRAZOLE SODIUM 40 MG PO TBEC: DELAYED_RELEASE_TABLET | ORAL | 3 refills | Status: DC

## 2017-02-15 MED ORDER — LISINOPRIL-HYDROCHLOROTHIAZIDE 20-12.5 MG PO TABS: ORAL_TABLET | ORAL | 3 refills | Status: DC

## 2017-02-15 MED ORDER — CLONAZEPAM 2 MG PO TABS: 4.0000 mg | ORAL_TABLET | Freq: Every day | ORAL | 0 refills | Status: DC

## 2017-02-15 MED ORDER — TRAZODONE HCL 100 MG PO TABS: ORAL_TABLET | ORAL | 3 refills | Status: DC

## 2017-02-15 NOTE — Patient Instructions (Addendum)
   IF you received an x-ray today, you will receive an invoice from [facility]. Please contact [facility] at [phone] with questions or concerns regarding your invoice.   IF you received labwork today, you will receive an invoice from [facility]. Please contact [facility] at [phone] with questions or concerns regarding your invoice.   Our billing staff will not be able to assist you with questions regarding bills from these companies.  You will be contacted with the lab results as soon as they are available. The fastest way to get your results is to activate your My Chart account. Instructions are located on the last page of this paperwork. If you have not heard from us regarding the results in 2 weeks, please contact this office.      Health Maintenance, Male A healthy lifestyle and preventive care is important for your health and wellness. Ask your health care provider about what schedule of regular examinations is right for you. What should I know about weight and diet? Eat a Healthy Diet  Eat plenty of vegetables, fruits, whole grains, low-fat dairy products, and lean protein.  Do not eat a lot of foods high in solid fats, added sugars, or salt.  Maintain a Healthy Weight Regular exercise can help you achieve or maintain a healthy weight. You should:  Do at least 150 minutes of exercise each week. The exercise should increase your heart rate and make you sweat (moderate-intensity exercise).  Do strength-training exercises at least twice a week.  Watch Your Levels of Cholesterol and Blood Lipids  Have your blood tested for lipids and cholesterol every 5 years starting at 62 years of age. If you are at high risk for heart disease, you should start having your blood tested when you are 62 years old. You may need to have your cholesterol levels checked more often if: ? Your lipid or cholesterol levels are high. ? You are older than 62 years of age. ? You  are at high risk for heart disease.  What should I know about cancer screening? Many types of cancers can be detected early and may often be prevented. Lung Cancer  You should be screened every year for lung cancer if: ? You are a current smoker who has smoked for at least 30 years. ? You are a former smoker who has quit within the past 15 years.  Talk to your health care provider about your screening options, when you should start screening, and how often you should be screened.  Colorectal Cancer  Routine colorectal cancer screening usually begins at 62 years of age and should be repeated every 5-10 years until you are 62 years old. You may need to be screened more often if early forms of precancerous polyps or small growths are found. Your health care provider may recommend screening at an earlier age if you have risk factors for colon cancer.  Your health care provider may recommend using home test kits to check for hidden blood in the stool.  A small camera at the end of a tube can be used to examine your colon (sigmoidoscopy or colonoscopy). This checks for the earliest forms of colorectal cancer.  Prostate and Testicular Cancer  Depending on your age and overall health, your health care provider may do certain tests to screen for prostate and testicular cancer.  Talk to your health care provider about any symptoms or concerns you have about testicular or prostate cancer.  Skin Cancer  Check your skin   from head to toe regularly.  Tell your health care provider about any new moles or changes in moles, especially if: ? There is a change in a mole's size, shape, or color. ? You have a mole that is larger than a pencil eraser.  Always use sunscreen. Apply sunscreen liberally and repeat throughout the day.  Protect yourself by wearing long sleeves, pants, a wide-brimmed hat, and sunglasses when outside.  What should I know about heart disease, diabetes, and high blood  pressure?  If you are 18-39 years of age, have your blood pressure checked every 3-5 years. If you are 40 years of age or older, have your blood pressure checked every year. You should have your blood pressure measured twice-once when you are at a hospital or clinic, and once when you are not at a hospital or clinic. Record the average of the two measurements. To check your blood pressure when you are not at a hospital or clinic, you can use: ? An automated blood pressure machine at a pharmacy. ? A home blood pressure monitor.  Talk to your health care provider about your target blood pressure.  If you are between 45-79 years old, ask your health care provider if you should take aspirin to prevent heart disease.  Have regular diabetes screenings by checking your fasting blood sugar level. ? If you are at a normal weight and have a low risk for diabetes, have this test once every three years after the age of 45. ? If you are overweight and have a high risk for diabetes, consider being tested at a younger age or more often.  A one-time screening for abdominal aortic aneurysm (AAA) by ultrasound is recommended for men aged 65-75 years who are current or former smokers. What should I know about preventing infection? Hepatitis B If you have a higher risk for hepatitis B, you should be screened for this virus. Talk with your health care provider to find out if you are at risk for hepatitis B infection. Hepatitis C Blood testing is recommended for:  Everyone born from 1945 through 1965.  Anyone with known risk factors for hepatitis C.  Sexually Transmitted Diseases (STDs)  You should be screened each year for STDs including gonorrhea and chlamydia if: ? You are sexually active and are younger than 62 years of age. ? You are older than 62 years of age and your health care provider tells you that you are at risk for this type of infection. ? Your sexual activity has changed since you were last  screened and you are at an increased risk for chlamydia or gonorrhea. Ask your health care provider if you are at risk.  Talk with your health care provider about whether you are at high risk of being infected with HIV. Your health care provider may recommend a prescription medicine to help prevent HIV infection.  What else can I do?  Schedule regular health, dental, and eye exams.  Stay current with your vaccines (immunizations).  Do not use any tobacco products, such as cigarettes, chewing tobacco, and e-cigarettes. If you need help quitting, ask your health care provider.  Limit alcohol intake to no more than 2 drinks per day. One drink equals 12 ounces of beer, 5 ounces of wine, or 1 ounces of hard liquor.  Do not use street drugs.  Do not share needles.  Ask your health care provider for help if you need support or information about quitting drugs.  Tell your health care   provider if you often feel depressed.  Tell your health care provider if you have ever been abused or do not feel safe at home. This information is not intended to replace advice given to you by your health care provider. Make sure you discuss any questions you have with your health care provider. Document Released: 08/15/2007 Document Revised: 10/16/2015 Document Reviewed: 11/20/2014 Elsevier Interactive Patient Education  2018 [employer]  

## 2017-02-15 NOTE — Progress Notes (Signed)
Chief Complaint  Patient presents with  . Annual Exam    complete physical exam    Subjective:  Casey [name]. is a 61 y.o. male here for a health maintenance visit.  Patient is established pt  Patient Active Problem List   Diagnosis Date Noted  . HTN (hypertension) 03/24/2016  . GERD (gastroesophageal reflux disease)   . Insomnia with sleep apnea 12/06/2013  . OSA on CPAP 12/06/2013  . Insomnia secondary to restless leg syndrome 07/17/2013  . Circadian rhythm sleep disorder, shift work type 07/17/2013  . Osteoarthritis, multiple sites 04/26/2011  . Insomnia 04/26/2011    Past Medical History:  Diagnosis Date  . Allergy    seasonal  . Arthritis    mild  . Cancer ([place])    skin  . Circadian rhythm sleep disorder, shift work type 07/17/2013  . Complication of anesthesia    nausea and vomiting  . GERD (gastroesophageal reflux disease)   . Hypertension   . Insomnia secondary to restless leg syndrome 07/17/2013  . Kidney stones   . OSA on CPAP   . PONV (postoperative nausea and vomiting)   . Sleep apnea    cpap    Past Surgical History:  Procedure Laterality Date  . acl repair left knee    . BACK SURGERY  2017  . CARDIAC CATHETERIZATION  2005   negative   . CHOLECYSTECTOMY    . CYSTOSCOPY/RETROGRADE/URETEROSCOPY/STONE EXTRACTION WITH BASKET  04/07/2012   Procedure: CYSTOSCOPY/RETROGRADE/URETEROSCOPY/STONE EXTRACTION WITH BASKET;  Surgeon: Malka So, MD;  Location: [facility];  Service: Urology;  Laterality: Bilateral;  cystoscopy, bilateral stent removal, bilateral ureteroscopy with bilateral stone extraction and insertion of right ureteral stent  . fusion great toe right foot    . ligaments repair left wrist     . ROTATOR CUFF REPAIR     left shoulder   . skin cancer removed from left cheek    . stone extractions and urethral stents     multiple  . tendons cut on left heel    . TONSILLECTOMY    . VARICOCELE EXCISION       Outpatient Medications Prior to  Visit  Medication Sig Dispense Refill  . Ascorbic Acid (VITAMIN C) 100 MG tablet Take 100 mg by mouth daily.    . B Complex Vitamins (VITAMIN-B COMPLEX PO) Take 1 tablet by mouth daily.    . cetirizine (ZYRTEC) 10 MG tablet Take 10 mg by mouth daily.    . cyclobenzaprine (FLEXERIL) 10 MG tablet Take 1 tablet (10 mg total) by mouth at bedtime. 1 tablet 0  . docusate sodium (COLACE) 100 MG capsule Take 100 mg by mouth 2 (two) times daily.    . Multiple Vitamin (MULTIVITAMIN) tablet Take 1 tablet by mouth daily.    . naproxen sodium (ANAPROX) 220 MG tablet Take 440 mg by mouth 2 (two) times daily with a meal.    . Probiotic Product (PROBIOTIC DAILY PO) Take 1 tablet by mouth daily.     . traMADol (ULTRAM) 50 MG tablet TAKE 1 TABLET (50 MG TOTAL) BY MOUTH 2 (TWO) TIMES DAILY AS NEEDED 15 tablet 0  . traZODone (DESYREL) 100 MG tablet Take 2 tablets by mouth at  bedtime 180 tablet 3  . Vitamin D-Vitamin K (VITAMIN K2-VITAMIN D3) 45-2000 MCG-UNIT CAPS Take 1 capsule by mouth daily.    . clonazePAM (KLONOPIN) 2 MG tablet TAKE 2 TABLETS BY MOUTH AT BEDTIME 60 tablet 0  . lisinopril-hydrochlorothiazide (PRINZIDE,ZESTORETIC) 20-12.5  MG tablet Take 1 tablet by mouth  every evening 90 tablet 3  . pantoprazole (PROTONIX) 40 MG tablet TAKE 1 TABLET (40 MG TOTAL) BY MOUTH DAILY. 90 tablet 3  . TURMERIC PO Take by mouth.     No facility-administered medications prior to visit.     Allergies  Allergen Reactions  . Prednisone   . Codeine Itching  . Hydrocodone Itching     Family History  Problem Relation Age of Onset  . Cancer Mother        bladder cancer  . Heart disease Mother        CABG  . Cancer Father        lung cancer after asbestosis  . Hypertension Father   . Diabetes Brother   . Colon cancer Neg Hx      Health Habits: Dental Exam: up to date Eye Exam: up to date Exercise: 3 times/week on average Current exercise activities: walking Diet: balanced  Social History    Socioeconomic History  . Marital status: Married    Spouse name: [name]  . Number of children: 2  . Years of education: 12+   . Highest education level: Not on file  Social Needs  . Financial resource strain: Not on file  . Food insecurity - worry: Not on file  . Food insecurity - inability: Not on file  . Transportation needs - medical: Not on file  . Transportation needs - non-medical: Not on file  Occupational History  . 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 Smoking status: Never Smoker  . Smokeless tobacco: Never Used  Substance and Sexual Activity  . Alcohol use: No    Alcohol/week: 0.0 oz  . Drug use: No  . Sexual activity: Yes  Other Topics Concern  . 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   Patient is a retired Agricultural consultant.   Patient has two children.   Patient has 5 years of [employer].   Patient is right-handed.   Social History   Substance and Sexual Activity  Alcohol Use No  . Alcohol/week: 0.0 oz   Social History   Tobacco Use  Smoking Status Never Smoker  Smokeless Tobacco Never Used   Social History   Substance and Sexual Activity  Drug Use No     Health Maintenance: See under health Maintenance activity for review of completion dates as well. Immunization History  Administered Date(s) Administered  . Influenza Split 03/02/2014, 11/30/2016  . Influenza Whole 12/21/2011  . Influenza,inj,Quad PF,6+ Mos 12/08/2012, 11/06/2014, 01/18/2016  . Pneumococcal Polysaccharide-23 05/09/2015  . Tdap 04/30/2009, 03/19/2013  . Zoster 01/01/2016      Depression Screen-PHQ2/9 Depression screen [facility] 2/9 02/15/2017 02/04/2016 01/18/2016 01/14/2016 10/16/2015  Decreased Interest 0 0 0 0 0  Down, Depressed, Hopeless 0 0 0 0 0  PHQ - 2 Score 0 0 0 0 0      Depression Severity and Treatment Recommendations:  0-4=  None  5-9= Mild / Treatment: Support, educate to call if worse; return in one month  10-14= Moderate / Treatment: Support, watchful waiting; Antidepressant or Psycotherapy  15-19= Moderately severe / Treatment: Antidepressant OR Psychotherapy  >= 20 = Major depression, severe / Antidepressant AND Psychotherapy    Review of Systems   Review of Systems  Constitutional: Negative for chills, fever, malaise/fatigue and weight  loss.  HENT: Negative for congestion and sinus pain.   Eyes: Negative for blurred vision, double vision and photophobia.  Respiratory: Negative for cough, shortness of breath and wheezing.   Cardiovascular: Negative for chest pain and palpitations.  Gastrointestinal: Negative for abdominal pain, constipation, diarrhea, nausea and vomiting.  Genitourinary: Negative for dysuria, frequency and urgency.  Musculoskeletal: Positive for back pain.       Back injury since 04/2016  Skin: Negative for itching and rash.  Neurological: Negative for dizziness, tingling, tremors, sensory change, speech change and headaches.  Psychiatric/Behavioral: Negative for depression and hallucinations. The patient has insomnia.     See HPI for ROS as well.    Objective:   Vitals:   02/15/17 0808  BP: (!) 134/92  Pulse: 86  Resp: 17  Temp: 98.4 F (36.9 C)  TempSrc: Oral  SpO2: 94%  Weight: 216 lb 6.4 oz (98.2 kg)  Height: 5\' 6"  (1.676 m)    Body mass index is 34.93 kg/m.  Physical Exam  Constitutional: He is oriented to person, place, and time. He appears well-developed and well-nourished.  HENT:  Head: Normocephalic and atraumatic.  Right Ear: External ear normal.  Left Ear: External ear normal.  Nose: Nose normal.  Mouth/Throat: Oropharynx is clear and moist.  Eyes: Conjunctivae and EOM are normal. Pupils are equal, round, and reactive to light. Right eye exhibits no discharge. Left eye exhibits no discharge.  Neck: Normal range of motion. No thyromegaly present.    Cardiovascular: Normal rate and regular rhythm.  No murmur heard. Pulmonary/Chest: Effort normal and breath sounds normal. No respiratory distress. He has no wheezes.  Abdominal: Soft. Bowel sounds are normal. He exhibits no distension. There is no tenderness. There is no rebound.  Musculoskeletal: Normal range of motion. He exhibits no edema.  Neurological: He is alert and oriented to person, place, and time. He has normal reflexes. No cranial nerve deficit.  Skin: Skin is warm. No rash noted. No erythema. No pallor.  Psychiatric: He has a normal mood and affect. His behavior is normal. Judgment and thought content normal.     Assessment/Plan:   Patient was seen for a health maintenance exam.  Counseled the patient on health maintenance issues. Reviewed her health mainteance schedule and ordered appropriate tests (see orders.) Counseled on regular exercise and weight management. Recommend regular eye exams and dental cleaning.   The following issues were addressed today for health maintenance:   Casey [name] was seen today for annual exam.  Diagnoses and all orders for this visit:  Health maintenance examination- discussed age appropriate screenings  Shift work sleep disorder Primary insomnia -  Retired Agricultural consultant now on trazodone, flexeril and clonazepam for sleep  -  Discussed that the combination of trazodone, flexeril and clonazepam as well as occasional tramadol for pain  -  Discussed that we have to implement a strategy   Microscopic hematuria- on UA in 2017, has a known kidney stone Follow up with Urology if there is gross blood or if his kidney stone becomes painful -     POCT urinalysis dipstick -     POCT Microscopic Urinalysis ([facility])  Elevated PSA- deferred DRE since pt just saw Urology -     PSA  Screening, lipid -     Comprehensive metabolic panel -     Lipid panel  Other orders -     Cancel: Flu Vaccine QUAD 36+ mos IM -     clonazePAM (KLONOPIN) 2 MG tablet; Take  2  tablets (4 mg total) by mouth at bedtime. -     lisinopril-hydrochlorothiazide (PRINZIDE,ZESTORETIC) 20-12.5 MG tablet; Take 1 tablet by mouth  every evening -     pantoprazole (PROTONIX) 40 MG tablet; TAKE 1 TABLET (40 MG TOTAL) BY MOUTH DAILY.    No Follow-up on file.    Body mass index is 34.93 kg/m.:  Discussed the patient's BMI with patient. The BMI body mass index is 34.93 kg/m.     No future appointments.  Patient Instructions       IF you received an x-ray today, you will receive an invoice from [facility]. Please contact [facility] at [phone] with questions or concerns regarding your invoice.   IF you received labwork today, you will receive an invoice from [facility]. Please contact [facility] at [phone] with questions or concerns regarding your invoice.   Our billing staff will not be able to assist you with questions regarding bills from these companies.  You will be contacted with the lab results as soon as they are available. The fastest way to get your results is to activate your My Chart account. Instructions are located on the last page of this paperwork. If you have not heard from [place] regarding the results in 2 weeks, please contact this office.     Health Maintenance, Male A healthy lifestyle and preventive care is important for your health and wellness. Ask your health care provider about what schedule of regular examinations is right for you. What should I know about weight and diet? Eat a Healthy Diet  Eat plenty of vegetables, fruits, whole grains, low-fat dairy products, and lean protein.  Do not eat a lot of foods high in solid fats, added sugars, or salt.  Maintain a Healthy Weight Regular exercise can help you achieve or maintain a healthy weight. You should:  Do at least 150 minutes of exercise each week. The exercise should increase your heart rate and make you sweat (moderate-intensity exercise).  Do  strength-training exercises at least twice a week.  Watch Your Levels of Cholesterol and Blood Lipids  Have your blood tested for lipids and cholesterol every 5 years starting at 62 years of age. If you are at high risk for heart disease, you should start having your blood tested when you are 62 years old. You may need to have your cholesterol levels checked more often if: ? Your lipid or cholesterol levels are high. ? You are older than 62 years of age. ? You are at high risk for heart disease.  What should I know about cancer screening? Many types of cancers can be detected early and may often be prevented. Lung Cancer  You should be screened every year for lung cancer if: ? You are a current smoker who has smoked for at least 30 years. ? You are a former smoker who has quit within the past 15 years.  Talk to your health care provider about your screening options, when you should start screening, and how often you should be screened.  Colorectal Cancer  Routine colorectal cancer screening usually begins at 62 years of age and should be repeated every 5-10 years until you are 62 years old. You may need to be screened more often if early forms of precancerous polyps or small growths are found. Your health care provider may recommend screening at an earlier age if you have risk factors for colon cancer.  Your health care provider may recommend using home test kits to check for  hidden blood in the stool.  A small camera at the end of a tube can be used to examine your colon (sigmoidoscopy or colonoscopy). This checks for the earliest forms of colorectal cancer.  Prostate and Testicular Cancer  Depending on your age and overall health, your health care provider may do certain tests to screen for prostate and testicular cancer.  Talk to your health care provider about any symptoms or concerns you have about testicular or prostate cancer.  Skin Cancer  Check your skin from head to toe  regularly.  Tell your health care provider about any new moles or changes in moles, especially if: ? There is a change in a mole's size, shape, or color. ? You have a mole that is larger than a pencil eraser.  Always use sunscreen. Apply sunscreen liberally and repeat throughout the day.  Protect yourself by wearing long sleeves, pants, a wide-brimmed hat, and sunglasses when outside.  What should I know about heart disease, diabetes, and high blood pressure?  If you are 50-38 years of age, have your blood pressure checked every 3-5 years. If you are 68 years of age or older, have your blood pressure checked every year. You should have your blood pressure measured twice-once when you are at a hospital or clinic, and once when you are not at a hospital or clinic. Record the average of the two measurements. To check your blood pressure when you are not at a hospital or clinic, you can use: ? An automated blood pressure machine at a pharmacy. ? A home blood pressure monitor.  Talk to your health care provider about your target blood pressure.  If you are between 33-79 years old, ask your health care provider if you should take aspirin to prevent heart disease.  Have regular diabetes screenings by checking your fasting blood sugar level. ? If you are at a normal weight and have a low risk for diabetes, have this test once every three years after the age of 80. ? If you are overweight and have a high risk for diabetes, consider being tested at a younger age or more often.  A one-time screening for abdominal aortic aneurysm (AAA) by ultrasound is recommended for men aged 6-75 years who are current or former smokers. What should I know about preventing infection? Hepatitis B If you have a higher risk for hepatitis B, you should be screened for this virus. Talk with your health care provider to find out if you are at risk for hepatitis B infection. Hepatitis C Blood testing is recommended  for:  Everyone born from [birth year] through 1966.  Anyone with known risk factors for hepatitis C.  Sexually Transmitted Diseases (STDs)  You should be screened each year for STDs including gonorrhea and chlamydia if: ? You are sexually active and are younger than 62 years of age. ? You are older than 62 years of age and your health care provider tells you that you are at risk for this type of infection. ? Your sexual activity has changed since you were last screened and you are at an increased risk for chlamydia or gonorrhea. Ask your health care provider if you are at risk.  Talk with your health care provider about whether you are at high risk of being infected with HIV. Your health care provider may recommend a prescription medicine to help prevent HIV infection.  What else can I do?  Schedule regular health, dental, and eye exams.  Stay current with your  vaccines (immunizations).  Do not use any tobacco products, such as cigarettes, chewing tobacco, and e-cigarettes. If you need help quitting, ask your health care provider.  Limit alcohol intake to no more than 2 drinks per day. One drink equals 12 ounces of beer, 5 ounces of wine, or 1 ounces of hard liquor.  Do not use street drugs.  Do not share needles.  Ask your health care provider for help if you need support or information about quitting drugs.  Tell your health care provider if you often feel depressed.  Tell your health care provider if you have ever been abused or do not feel safe at home. This information is not intended to replace advice given to you by your health care provider. Make sure you discuss any questions you have with your health care provider. Document Released: 08/15/2007 Document Revised: 10/16/2015 Document Reviewed: 11/20/2014 Elsevier Interactive Patient Education  [employer].

## 2017-02-15 NOTE — Telephone Encounter (Signed)
Called in klonopin 2 mg one tab po at bedtime # 53 with 0 refills to [facility] [place] [place] per Dr. Nolon Rod. Dgaddy, CMA

## 2017-02-16 LAB — COMPREHENSIVE METABOLIC PANEL
A/G RATIO: 1.7 (ref 1.2–2.2)
ALT: 21 IU/L (ref 0–44)
AST: 14 IU/L (ref 0–40)
Albumin: 4.1 g/dL (ref 3.6–4.8)
Alkaline Phosphatase: 83 IU/L (ref 39–117)
BILIRUBIN TOTAL: 0.4 mg/dL (ref 0.0–1.2)
BUN/Creatinine Ratio: 17 (ref 10–24)
BUN: 21 mg/dL (ref 8–27)
CALCIUM: 8.9 mg/dL (ref 8.6–10.2)
CHLORIDE: 104 mmol/L (ref 96–106)
CO2: 24 mmol/L (ref 20–29)
Creatinine, Ser: 1.25 mg/dL (ref 0.76–1.27)
GFR calc non Af Amer: 61 mL/min/{1.73_m2} (ref >59)
GFR, EST AFRICAN AMERICAN: 71 mL/min/{1.73_m2} (ref >59)
GLUCOSE: 94 mg/dL (ref 65–99)
Globulin, Total: 2.4 g/dL (ref 1.5–4.5)
POTASSIUM: 4.2 mmol/L (ref 3.5–5.2)
Sodium: 143 mmol/L (ref 134–144)
TOTAL PROTEIN: 6.5 g/dL (ref 6.0–8.5)

## 2017-02-16 LAB — LIPID PANEL
CHOL/HDL RATIO: 4.3 ratio (ref 0.0–5.0)
Cholesterol, Total: 169 mg/dL (ref 100–199)
HDL: 39 mg/dL — AB (ref >39)
LDL CALC: 110 mg/dL — AB (ref 0–99)
TRIGLYCERIDES: 99 mg/dL (ref 0–149)
VLDL CHOLESTEROL CAL: 20 mg/dL (ref 5–40)

## 2017-02-16 LAB — PSA: Prostate Specific Ag, Serum: 7.7 ng/mL — ABNORMAL HIGH (ref 0.0–4.0)

## 2017-02-26 ENCOUNTER — Other Ambulatory Visit: Payer: Self-pay | Admitting: Urology

## 2017-02-26 DIAGNOSIS — R972 Elevated prostate specific antigen [PSA]: Secondary | ICD-10-CM

## 2017-03-15 ENCOUNTER — Other Ambulatory Visit: Payer: Self-pay

## 2017-03-24 ENCOUNTER — Other Ambulatory Visit: Payer: Self-pay | Admitting: Family Medicine

## 2017-03-24 NOTE — Telephone Encounter (Signed)
Requesting refill of Klonopin  LOV 02/15/17 with Dr. Nolon Rod  [facility] 02/15/17  #60

## 2017-03-25 NOTE — Telephone Encounter (Signed)
Please advise dgaddy

## 2017-03-28 ENCOUNTER — Encounter: Payer: Self-pay | Admitting: Family Medicine

## 2017-03-29 NOTE — Telephone Encounter (Signed)
Refilled on 03/30/17 dgaddy

## 2017-05-03 ENCOUNTER — Other Ambulatory Visit: Payer: Self-pay | Admitting: Family Medicine

## 2017-05-31 ENCOUNTER — Other Ambulatory Visit: Payer: Self-pay | Admitting: Family Medicine

## 2017-05-31 NOTE — Telephone Encounter (Signed)
Please advise. Dgaddy, CMA 

## 2017-05-31 NOTE — Telephone Encounter (Signed)
LOV:02/15/17  Dr. Nolon Rod  [facility] [address]

## 2017-07-01 ENCOUNTER — Other Ambulatory Visit: Payer: Self-pay | Admitting: Family Medicine

## 2017-07-01 NOTE — Telephone Encounter (Signed)
clonazepam refill Last OV: 02/15/17 Last Refill:06/01/17 #60 tab no RF Pharmacy:[facility].  PCP: Casey Chimes MD

## 2017-07-01 NOTE — Telephone Encounter (Signed)
Please advise. dgaddy

## 2017-08-04 ENCOUNTER — Other Ambulatory Visit: Payer: Self-pay | Admitting: Family Medicine

## 2017-08-05 NOTE — Telephone Encounter (Signed)
Rx refill request Klonipin  LOV 02-15-2017 Dr Nolon Rod  Last Filled 07/02/2017 60 tabs  2 tabs HS NR  Pharmacy on file

## 2017-08-09 ENCOUNTER — Other Ambulatory Visit: Payer: Self-pay | Admitting: Family Medicine

## 2017-08-09 ENCOUNTER — Telehealth: Payer: Self-pay | Admitting: Family Medicine

## 2017-08-09 NOTE — Telephone Encounter (Signed)
Copied from [place] [id number]. Topic: Quick Communication - Rx Refill/Question >> Aug 09, 2017 10:05 AM Oliver Pila B wrote: Medication: cyclobenzaprine (FLEXERIL) 10 MG tablet [[id number]]   Pt called about the medication above and is asking for him to go back to his original dosage of taking 2 pills instead of 1; call pt to advise

## 2017-08-10 ENCOUNTER — Other Ambulatory Visit: Payer: Self-pay | Admitting: Family Medicine

## 2017-08-10 NOTE — Telephone Encounter (Signed)
Patient is requesting a refill of the following medications: Requested Prescriptions   Pending Prescriptions Disp Refills  . clonazePAM (KLONOPIN) 2 MG tablet [Pharmacy Med Name: CLONAZEPAM 2 MG TABLET] 60 tablet 0    Sig: TAKE 2 TABLETS BY MOUTH AT BEDTIME    Date of patient request: 08/09/2017 Last office visit: 02/15/2017 Date of last refill: 07/02/2017 Last refill amount: 60 with no refills Follow up time period per chart: Return in approx. 6 months (around 08/16/2017) for med check.  Please advise. Erick Murin

## 2017-08-10 NOTE — Telephone Encounter (Signed)
Relation to pt: self  Call back number:[phone] [id number] [place], [place] [phone] (Phone) [phone] (Fax)     Reason for call:  Patient scheduled 6 month follow up for 08/16/17, patient checking on the status of cyclobenzaprine (FLEXERIL) 10 MG tablet 90 day supply, informed patient please allow 72 hour turn around time.

## 2017-08-10 NOTE — Telephone Encounter (Signed)
Patient needs office visit for refill of this medication. He can schedule earlier appointment with provider if needed. Please call to schedule.

## 2017-08-10 NOTE — Telephone Encounter (Signed)
Relation to pt: self  Call back number:[phone] Pharmacy: [id number] [place], [place] [phone] (Phone) [phone] (Fax)     Reason for call:  Patient scheduled 6 month follow up for 08/16/17, patient checking on the status of clonazePAM (KLONOPIN) 2 MG tablet stating he's completely out, and request was sent in on 08/04/17. Informed patient PCP is out of the office and nurse will follow up with him tomorrow 08/11/17

## 2017-08-10 NOTE — Telephone Encounter (Signed)
Klonopin 2 mg refill request  LOV 02/15/17 with Dr. Nolon Rod  Last refill:  07/02/17   #60   0 refills

## 2017-08-11 ENCOUNTER — Ambulatory Visit: Payer: BC Managed Care – PPO | Admitting: Family Medicine

## 2017-08-11 ENCOUNTER — Encounter: Payer: Self-pay | Admitting: Family Medicine

## 2017-08-11 VITALS — BP 118/69 | HR 97 | Temp 99.8°F | Resp 17 | Ht 66.0 in | Wt 219.0 lb

## 2017-08-11 DIAGNOSIS — F5101 Primary insomnia: Secondary | ICD-10-CM

## 2017-08-11 DIAGNOSIS — Z760 Encounter for issue of repeat prescription: Secondary | ICD-10-CM | POA: Diagnosis not present

## 2017-08-11 DIAGNOSIS — G4726 Circadian rhythm sleep disorder, shift work type: Secondary | ICD-10-CM | POA: Diagnosis not present

## 2017-08-11 DIAGNOSIS — F1320 Sedative, hypnotic or anxiolytic dependence, uncomplicated: Secondary | ICD-10-CM | POA: Diagnosis not present

## 2017-08-11 MED ORDER — CYCLOBENZAPRINE HCL 10 MG PO TABS: 10.0000 mg | ORAL_TABLET | Freq: Every day | ORAL | 1 refills | Status: DC

## 2017-08-11 MED ORDER — TRAZODONE HCL 100 MG PO TABS: ORAL_TABLET | ORAL | 3 refills | Status: DC

## 2017-08-11 NOTE — Progress Notes (Signed)
Chief Complaint  Patient presents with  . Medication Refill  . 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 Allergy    seasonal  . Arthritis    mild  . Cancer ([place])    skin  . Circadian rhythm sleep disorder, shift work type 07/17/2013  . Complication of anesthesia    nausea and vomiting  . GERD (gastroesophageal reflux disease)   . Hypertension   . Insomnia secondary to restless leg syndrome 07/17/2013  . Kidney stones   . OSA on CPAP   . PONV (postoperative nausea and vomiting)   . Sleep apnea    cpap    Current Outpatient Medications  Medication Sig Dispense Refill  . Ascorbic Acid (VITAMIN C) 100 MG tablet Take 100 mg by mouth daily.    . B Complex Vitamins (VITAMIN-B COMPLEX PO) Take 1 tablet by mouth daily.    . cetirizine (ZYRTEC) 10 MG tablet Take 10 mg by mouth daily.    . cyclobenzaprine (FLEXERIL) 10 MG tablet Take 1 tablet (10 mg total) by mouth at bedtime. 90 tablet 1  . docusate sodium (COLACE) 100 MG capsule Take 100 mg by mouth 2 (two) times daily.    Marland Kitchen lisinopril-hydrochlorothiazide (PRINZIDE,ZESTORETIC) 20-12.5 MG tablet Take 1 tablet by mouth  every evening 90 tablet 3  . Multiple Vitamin (MULTIVITAMIN) tablet Take 1 tablet by mouth daily.    . naproxen sodium (ANAPROX) 220 MG tablet Take 440 mg by mouth 2 (two) times daily with a meal.    . pantoprazole (PROTONIX) 40 MG tablet TAKE 1 TABLET (40 MG TOTAL) BY MOUTH DAILY. 90 tablet 3  . Probiotic Product (PROBIOTIC DAILY PO) Take 1 tablet by mouth daily.     . traMADol (ULTRAM) 50 MG tablet TAKE 1 TABLET (50 MG TOTAL) BY MOUTH 2 (TWO) TIMES DAILY AS NEEDED 15 tablet 0  . traZODone (DESYREL)  100 MG tablet Take 2 tablets by mouth at  bedtime 180 tablet 3  . TURMERIC PO Take by mouth.    . Vitamin D-Vitamin K (VITAMIN K2-VITAMIN D3) 45-2000 MCG-UNIT CAPS Take 1 capsule by mouth daily.    . clonazePAM (KLONOPIN) 2 MG tablet TAKE 2 TABLETS BY MOUTH AT BEDTIME 60 tablet 2   No current facility-administered medications for this visit.     Allergies:  Allergies  Allergen Reactions  . Prednisone   . Codeine Itching  . Hydrocodone Itching    Past Surgical History:  Procedure Laterality Date  . acl repair left knee    . BACK SURGERY  2017  . CARDIAC CATHETERIZATION  2005   negative   . CHOLECYSTECTOMY    . CYSTOSCOPY/RETROGRADE/URETEROSCOPY/STONE EXTRACTION WITH BASKET  04/07/2012   Procedure: CYSTOSCOPY/RETROGRADE/URETEROSCOPY/STONE EXTRACTION WITH BASKET;  Surgeon: Malka So, MD;  Location: [facility];  Service: Urology;  Laterality: Bilateral;  cystoscopy, bilateral stent removal, bilateral ureteroscopy with bilateral stone extraction and insertion of right ureteral stent  . fusion great toe right foot    . ligaments repair left wrist     . ROTATOR CUFF REPAIR     left shoulder   . skin  cancer removed from left cheek    . stone extractions and urethral stents     multiple  . tendons cut on left heel    . TONSILLECTOMY    . VARICOCELE EXCISION      Social History   Socioeconomic History  . Marital status: Married    Spouse name: [name]  . Number of children: 2  . Years of education: 12+   . Highest education level: Not on file  Occupational History  . 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 Financial resource strain: Not on file  . Food insecurity:    Worry: Not on file    Inability: Not on file  . Transportation needs:    Medical: Not on file    Non-medical: Not on file  Tobacco Use  . Smoking status: Never Smoker  . Smokeless tobacco: Never Used  Substance and Sexual Activity    . Alcohol use: No    Alcohol/week: 0.0 oz  . Drug use: No  . Sexual activity: Yes  Lifestyle  . Physical activity:    Days per week: Not on file    Minutes per session: Not on file  . Stress: Not on file  Relationships  . Social connections:    Talks on phone: Not on file    Gets together: Not on file    Attends religious service: Not on file    Active member of club or organization: Not on file    Attends meetings of clubs or organizations: Not on file    Relationship status: Not on file  Other Topics Concern  . 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   Patient is a retired Agricultural consultant.   Patient has two children.   Patient has 5 years of [employer].   Patient is right-handed.    Family History  Problem Relation Age of Onset  . Cancer Mother        bladder cancer  . Heart disease Mother        CABG  . Cancer Father        lung cancer after asbestosis  . Hypertension Father   . Diabetes Brother   . Colon cancer Neg Hx      ROS Review of Systems See HPI Constitution: No fevers or chills No malaise No diaphoresis Skin: No rash or itching Eyes: no blurry vision, no double vision GU: no dysuria or hematuria Neuro: no dizziness or headaches * all others reviewed and negative   Objective: Vitals:   08/11/17 1658  BP: 118/69  Pulse: 97  Resp: 17  Temp: 99.8 F (37.7 C)  TempSrc: Oral  SpO2: 93%  Weight: 219 lb (99.3 kg)  Height: 5\' 6"  (1.676 m)    Physical Exam   Physical Exam  Constitutional: She is oriented to person, place, and time. She appears well-developed and well-nourished.  HENT:  Head: Normocephalic and atraumatic.  Eyes: Conjunctivae and EOM are normal.  Cardiovascular: Normal rate, regular rhythm and normal heart sounds.   Pulmonary/Chest: Effort normal and breath sounds normal. No respiratory distress. She has no wheezes. .  Neurological: She is alert and oriented to person,  place, and time.     Assessment and Plan Casey [name] was seen today for medication refill and 6 month f/u.  Diagnoses and all orders for this visit:  Shift work sleep disorder  Primary insomnia  Encounter for medication refill  Benzodiazepine dependence ([place])  Other orders -     cyclobenzaprine (FLEXERIL) 10 MG tablet; Take 1 tablet (10 mg total) by mouth at bedtime. -     traZODone (DESYREL) 100 MG tablet; Take 2 tablets by mouth at  bedtime   Plan to decrease flexeril from twice a day to once a day to wean off flexeril Discussed that chronic flexeril is habit forming and no longer effective Next visit will discontinue flexeril  For daily use and only provide half the quantity   Next visit will also decrease clonazepam Discussed that it will probably take a year or two to wean due to his dependence and long term use.  Patient is agreeable  Elonda Giuliano A Nolon Rod

## 2017-08-11 NOTE — Telephone Encounter (Signed)
Patient is requesting a refill of the following medications: Requested Prescriptions   Pending Prescriptions Disp Refills  . clonazePAM (KLONOPIN) 2 MG tablet [Pharmacy Med Name: CLONAZEPAM 2 MG TABLET] 60 tablet 0    Sig: TAKE 2 TABLETS BY MOUTH AT BEDTIME  . cyclobenzaprine (FLEXERIL) 10 MG tablet [Pharmacy Med Name: CYCLOBENZAPRINE 10 MG TABLET] 90 tablet 1    Sig: TAKE 1 TABLET BY MOUTH EVERYDAY AT BEDTIME    Date of patient request: [id number] Last office visit: 02/15/2017 Date of last refill: klonopin 07/02/17 and flexeril 02/15/2017 Last refill amount: klonopin #60 and flexeril #90 Follow up time period per chart: appt 08/16/17

## 2017-08-11 NOTE — Patient Instructions (Signed)
     IF you received an x-ray today, you will receive an invoice from [facility]. Please contact  Radiology at [phone] with questions or concerns regarding your invoice.   IF you received labwork today, you will receive an invoice from [facility]. Please contact [facility] at [phone] with questions or concerns regarding your invoice.   Our billing staff will not be able to assist you with questions regarding bills from these companies.  You will be contacted with the lab results as soon as they are available. The fastest way to get your results is to activate your My Chart account. Instructions are located on the last page of this paperwork. If you have not heard from us regarding the results in 2 weeks, please contact this office.     

## 2017-08-11 NOTE — Telephone Encounter (Signed)
Klonopin 2 mg and Flexeril 10 mg refill request  LOV 02/15/17 with [facility].    Has appt coming up on 08/16/17 at 2:00.  Last refill:  Klonopin 07/02/17  #60  0 refills                   Flexeril 02/15/17  #90  With 1 refill    [id number] - [place], [place]

## 2017-08-12 ENCOUNTER — Telehealth: Payer: Self-pay | Admitting: Family Medicine

## 2017-08-12 NOTE — Telephone Encounter (Signed)
Copied from [place] [phone]. Topic: Quick Communication - See Telephone Encounter >> Aug 12, 2017  3:06 PM Boyd Kerbs wrote: CRM for notification. See Telephone encounter for: 08/12/17.  He is asking what he can do to get off medication : clonazePAM (KLONOPIN) 2 MG tablet  Please call him

## 2017-08-12 NOTE — Telephone Encounter (Signed)
Message sent to Dr. Nolon Rod re: getting off Klonopin

## 2017-08-16 ENCOUNTER — Telehealth: Payer: Self-pay

## 2017-08-16 ENCOUNTER — Ambulatory Visit: Payer: BC Managed Care – PPO | Admitting: Family Medicine

## 2017-08-16 ENCOUNTER — Encounter: Payer: Self-pay | Admitting: Family Medicine

## 2017-08-16 NOTE — Progress Notes (Signed)
Flexeril taper

## 2017-08-16 NOTE — Telephone Encounter (Signed)
Please let the patient know that he should try to cut back his flexeril by half so that he can wean off his flexeril then we can work on the clonazepam taper.

## 2017-08-16 NOTE — Telephone Encounter (Signed)
Spoke with pt via phone re: tapering down off of cyclobenzaprine.  Directions given on how to taper off over a 3 month period, advised pt not to try to remember instructions as I will send them via mail to his home address.  Pt verbalizes understanding and agreeable. Dgaddy, CMA

## 2017-08-16 NOTE — Telephone Encounter (Signed)
Spoke with pt via phone and given instructions on how to taper down off of cyclobenzaprine, advise will send letter with taper info via mail to home address.  Pt verbalized understanding. Dgaddy, CMA

## 2017-11-11 ENCOUNTER — Other Ambulatory Visit: Payer: Self-pay | Admitting: Family Medicine

## 2017-11-11 MED ORDER — CLONAZEPAM 2 MG PO TABS: 4.0000 mg | ORAL_TABLET | Freq: Every day | ORAL | 2 refills | Status: DC

## 2017-12-30 ENCOUNTER — Encounter

## 2017-12-30 ENCOUNTER — Ambulatory Visit (INDEPENDENT_AMBULATORY_CARE_PROVIDER_SITE_OTHER): Payer: BC Managed Care – PPO

## 2017-12-30 ENCOUNTER — Other Ambulatory Visit: Payer: Self-pay

## 2017-12-30 ENCOUNTER — Encounter: Payer: Self-pay | Admitting: Family Medicine

## 2017-12-30 ENCOUNTER — Ambulatory Visit: Payer: BC Managed Care – PPO | Admitting: Family Medicine

## 2017-12-30 VITALS — BP 145/78 | HR 72 | Temp 98.0°F | Resp 12 | Ht 65.0 in | Wt 217.2 lb

## 2017-12-30 DIAGNOSIS — Z23 Encounter for immunization: Secondary | ICD-10-CM | POA: Diagnosis not present

## 2017-12-30 DIAGNOSIS — M25562 Pain in left knee: Secondary | ICD-10-CM

## 2017-12-30 DIAGNOSIS — R972 Elevated prostate specific antigen [PSA]: Secondary | ICD-10-CM

## 2017-12-30 DIAGNOSIS — I10 Essential (primary) hypertension: Secondary | ICD-10-CM

## 2017-12-30 DIAGNOSIS — F1320 Sedative, hypnotic or anxiolytic dependence, uncomplicated: Secondary | ICD-10-CM

## 2017-12-30 MED ORDER — LISINOPRIL-HYDROCHLOROTHIAZIDE 20-12.5 MG PO TABS: ORAL_TABLET | ORAL | 3 refills | Status: DC

## 2017-12-30 MED ORDER — TRAMADOL HCL 50 MG PO TABS: ORAL_TABLET | ORAL | 0 refills | Status: DC

## 2017-12-30 MED ORDER — CETIRIZINE HCL 10 MG PO TABS: 10.0000 mg | ORAL_TABLET | Freq: Every day | ORAL | 3 refills | Status: DC

## 2017-12-30 MED ORDER — TRAZODONE HCL 100 MG PO TABS: ORAL_TABLET | ORAL | 3 refills | Status: DC

## 2017-12-30 MED ORDER — PANTOPRAZOLE SODIUM 40 MG PO TBEC: DELAYED_RELEASE_TABLET | ORAL | 3 refills | Status: DC

## 2017-12-30 MED ORDER — CLONAZEPAM 2 MG PO TABS: 3.0000 mg | ORAL_TABLET | Freq: Every day | ORAL | 0 refills | Status: DC

## 2017-12-30 MED ORDER — NAPROXEN SODIUM 220 MG PO TABS: 440.0000 mg | ORAL_TABLET | Freq: Two times a day (BID) | ORAL | 11 refills | Status: DC

## 2017-12-30 NOTE — Progress Notes (Signed)
Chief Complaint  Patient presents with  . Knee Pain    follow-up left knee -is doing much better  . Follow-up    would like to come off the clonazepam. also need refills on all meds    HPI Left Knee Discomfort Patient reports that he has been having left knee pain- left knee It is not hurting right now  He has an area of localized tenderness on the later aspect of the knee He reports a history of a torn meniscus in September 2019 while playing golf at [employer] He states that he still gets point tenderness and popping of the knee He denies buckling or giving out but it feels weak He has not problems getting out of chairs, sitting for long periods, going up or down stairs He states that the knee has a slight weakness  He denies swelling of the knee The pain was 10/10 at its worst  Medication mgmt/Benzo dependence Pt reports that he came off the flexeril and would like to taper off some of the other medications that he takes  He states that he would like to come off the clonazepam He is taking clonazepam 4mg  at bedtime  He is on worker's compensation for back pain for a year and a half  Elevated PSA Pt reports that he has not followed up with Dr. Jeffie Pollock with [facility] He denies LUTS He reports that he has not weight loss issues or nausea     Past Medical History:  Diagnosis Date  . Allergy    seasonal  . Arthritis    mild  . Cancer ([place])    skin  . Circadian rhythm sleep disorder, shift work type 07/17/2013  . Complication of anesthesia    nausea and vomiting  . GERD (gastroesophageal reflux disease)   . Hypertension   . Insomnia secondary to restless leg syndrome 07/17/2013  . Kidney stones   . OSA on CPAP   . PONV (postoperative nausea and vomiting)   . Sleep apnea    cpap    Current Outpatient Medications  Medication Sig Dispense Refill  . B Complex Vitamins (VITAMIN-B COMPLEX PO) Take 1 tablet by mouth daily.    . cetirizine (ZYRTEC) 10 MG tablet  Take 1 tablet (10 mg total) by mouth daily. 90 tablet 3  . clonazePAM (KLONOPIN) 2 MG tablet Take 1.5 tablets (3 mg total) by mouth at bedtime. 45 tablet 0  . lisinopril-hydrochlorothiazide (PRINZIDE,ZESTORETIC) 20-12.5 MG tablet Take 1 tablet by mouth  every evening 90 tablet 3  . naproxen sodium (ALEVE) 220 MG tablet Take 2 tablets (440 mg total) by mouth 2 (two) times daily with a meal. 60 tablet 11  . pantoprazole (PROTONIX) 40 MG tablet TAKE 1 TABLET (40 MG TOTAL) BY MOUTH DAILY. 90 tablet 3  . Probiotic Product (PROBIOTIC DAILY PO) Take 1 tablet by mouth daily.     . traMADol (ULTRAM) 50 MG tablet TAKE 1 TABLET (50 MG TOTAL) BY MOUTH 2 (TWO) TIMES DAILY AS NEEDED 15 tablet 0  . traZODone (DESYREL) 100 MG tablet Take 2 tablets by mouth at  bedtime 180 tablet 3  . TURMERIC PO Take by mouth.     No current facility-administered medications for this visit.     Allergies:  Allergies  Allergen Reactions  . Codeine Itching  . Hydrocodone Itching    Past Surgical History:  Procedure Laterality Date  . acl repair left knee    . BACK SURGERY  2017  . CARDIAC  CATHETERIZATION  2005   negative   . CHOLECYSTECTOMY    . CYSTOSCOPY/RETROGRADE/URETEROSCOPY/STONE EXTRACTION WITH BASKET  04/07/2012   Procedure: CYSTOSCOPY/RETROGRADE/URETEROSCOPY/STONE EXTRACTION WITH BASKET;  Surgeon: Malka So, MD;  Location: [facility];  Service: Urology;  Laterality: Bilateral;  cystoscopy, bilateral stent removal, bilateral ureteroscopy with bilateral stone extraction and insertion of right ureteral stent  . fusion great toe right foot    . ligaments repair left wrist     . ROTATOR CUFF REPAIR     left shoulder   . skin cancer removed from left cheek    . stone extractions and urethral stents     multiple  . tendons cut on left heel    . TONSILLECTOMY    . VARICOCELE EXCISION      Social History   Socioeconomic History  . Marital status: Married    Spouse name: [name]  . Number of children: 2  .  Years of education: 12+   . Highest education level: Not on file  Occupational History  . 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 Financial resource strain: Not on file  . Food insecurity:    Worry: Not on file    Inability: Not on file  . Transportation needs:    Medical: Not on file    Non-medical: Not on file  Tobacco Use  . Smoking status: Never Smoker  . Smokeless tobacco: Never Used  Substance and Sexual Activity  . Alcohol use: No    Alcohol/week: 0.0 standard drinks  . Drug use: No  . Sexual activity: Yes  Lifestyle  . Physical activity:    Days per week: Not on file    Minutes per session: Not on file  . Stress: Not on file  Relationships  . Social connections:    Talks on phone: Not on file    Gets together: Not on file    Attends religious service: Not on file    Active member of club or organization: Not on file    Attends meetings of clubs or organizations: Not on file    Relationship status: Not on file  Other Topics Concern  . 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   Patient is a retired Agricultural consultant.   Patient has two children.   Patient has 5 years of [employer] college.   Patient is right-handed.    Family History  Problem Relation Age of Onset  . Cancer Mother        bladder cancer  . Heart disease Mother        CABG  . Cancer Father        lung cancer after asbestosis  . Hypertension Father   . Diabetes Brother   . Colon cancer Neg Hx      ROS Review of Systems See HPI Constitution: No fevers or chills No malaise No diaphoresis Skin: No rash or itching Eyes: no blurry vision, no double vision GU: no dysuria or hematuria Neuro: no dizziness or headaches all others reviewed and negative   Objective: Vitals:   12/30/17 1015  BP: (!) 145/78  Pulse: 72  Resp: 12  Temp: 98 F (36.7 C)  TempSrc: Oral   SpO2: 96%  Weight: 217 lb 3.2 oz (98.5 kg)  Height: 5\' 5"  (1.651 m)    Physical Exam  Constitutional: He  is oriented to person, place, and time. He appears well-developed and well-nourished.  HENT:  Head: Normocephalic and atraumatic.  Eyes: Conjunctivae and EOM are normal.  Neck: Normal range of motion. Neck supple.  Cardiovascular: Normal rate, regular rhythm and normal heart sounds.  No murmur heard. Pulmonary/Chest: Effort normal and breath sounds normal. No stridor. No respiratory distress. He has no wheezes.  Musculoskeletal:       Legs: Neurological: He is alert and oriented to person, place, and time.  Skin: Skin is warm. Capillary refill takes less than 2 seconds.  Psychiatric: He has a normal mood and affect. His behavior is normal. Judgment and thought content normal.    Assessment and Plan Jshawn was seen today for knee pain and follow-up.  Diagnoses and all orders for this visit:  Acute pain of left knee- patellofemoral syndrome -     DG Knee Complete 4 Views Left; Future  Benzodiazepine dependence ([place])- discussed taper from clonazepam 4mg  to 3mg  Will taper down monthly  Elevated PSA- will check and forward to Dr. Jeffie Pollock -     PSA  Essential hypertension- bp medication refilled today Discussed DASH diet and exercise -     Lipid panel -     Comprehensive metabolic panel  Other orders -     Flu Vaccine QUAD 36+ mos IM -     clonazePAM (KLONOPIN) 2 MG tablet; Take 1.5 tablets (3 mg total) by mouth at bedtime. -     traZODone (DESYREL) 100 MG tablet; Take 2 tablets by mouth at  bedtime -     pantoprazole (PROTONIX) 40 MG tablet; TAKE 1 TABLET (40 MG TOTAL) BY MOUTH DAILY. -     traMADol (ULTRAM) 50 MG tablet; TAKE 1 TABLET (50 MG TOTAL) BY MOUTH 2 (TWO) TIMES DAILY AS NEEDED -     naproxen sodium (ALEVE) 220 MG tablet; Take 2 tablets (440 mg total) by mouth 2 (two) times daily with a meal. -     lisinopril-hydrochlorothiazide (PRINZIDE,ZESTORETIC) 20-12.5 MG  tablet; Take 1 tablet by mouth  every evening -     cetirizine (ZYRTEC) 10 MG tablet; Take 1 tablet (10 mg total) by mouth daily.     [place]

## 2017-12-30 NOTE — Patient Instructions (Signed)
° ° ° °  If you have lab work done today you will be contacted with your lab results within the next 2 weeks.  If you have not heard from us then please contact us. The fastest way to get your results is to register for My Chart. ° ° °IF you received an x-ray today, you will receive an invoice from [facility]. Please contact [facility] at [phone] with questions or concerns regarding your invoice.  ° °IF you received labwork today, you will receive an invoice from [facility]. Please contact [facility] at [phone] with questions or concerns regarding your invoice.  ° °Our billing staff will not be able to assist you with questions regarding bills from these companies. ° °You will be contacted with the lab results as soon as they are available. The fastest way to get your results is to activate your My Chart account. Instructions are located on the last page of this paperwork. If you have not heard from us regarding the results in 2 weeks, please contact this office. °  ° ° ° °

## 2017-12-31 LAB — COMPREHENSIVE METABOLIC PANEL
ALBUMIN: 4.4 g/dL (ref 3.6–4.8)
ALK PHOS: 75 IU/L (ref 39–117)
ALT: 23 IU/L (ref 0–44)
AST: 20 IU/L (ref 0–40)
Albumin/Globulin Ratio: 1.9 (ref 1.2–2.2)
BILIRUBIN TOTAL: 0.5 mg/dL (ref 0.0–1.2)
BUN / CREAT RATIO: 20 (ref 10–24)
BUN: 29 mg/dL — AB (ref 8–27)
CHLORIDE: 106 mmol/L (ref 96–106)
CO2: 25 mmol/L (ref 20–29)
Calcium: 9.2 mg/dL (ref 8.6–10.2)
Creatinine, Ser: 1.45 mg/dL — ABNORMAL HIGH (ref 0.76–1.27)
GFR calc Af Amer: 59 mL/min/{1.73_m2} — ABNORMAL LOW (ref >59)
GFR calc non Af Amer: 51 mL/min/{1.73_m2} — ABNORMAL LOW (ref >59)
GLOBULIN, TOTAL: 2.3 g/dL (ref 1.5–4.5)
GLUCOSE: 97 mg/dL (ref 65–99)
Potassium: 4 mmol/L (ref 3.5–5.2)
SODIUM: 146 mmol/L — AB (ref 134–144)
Total Protein: 6.7 g/dL (ref 6.0–8.5)

## 2017-12-31 LAB — LIPID PANEL
Chol/HDL Ratio: 3.8 ratio (ref 0.0–5.0)
Cholesterol, Total: 145 mg/dL (ref 100–199)
HDL: 38 mg/dL — AB (ref >39)
LDL CALC: 91 mg/dL (ref 0–99)
Triglycerides: 79 mg/dL (ref 0–149)
VLDL CHOLESTEROL CAL: 16 mg/dL (ref 5–40)

## 2017-12-31 LAB — PSA: PROSTATE SPECIFIC AG, SERUM: 10.3 ng/mL — AB (ref 0.0–4.0)

## 2018-02-02 ENCOUNTER — Encounter

## 2018-02-02 ENCOUNTER — Ambulatory Visit: Payer: BC Managed Care – PPO | Admitting: Emergency Medicine

## 2018-02-02 ENCOUNTER — Other Ambulatory Visit: Payer: Self-pay

## 2018-02-02 ENCOUNTER — Encounter: Payer: Self-pay | Admitting: Emergency Medicine

## 2018-02-02 VITALS — BP 102/68 | HR 82 | Temp 98.1°F | Resp 16 | Ht 65.0 in | Wt 203.8 lb

## 2018-02-02 DIAGNOSIS — R0981 Nasal congestion: Secondary | ICD-10-CM | POA: Diagnosis not present

## 2018-02-02 DIAGNOSIS — R059 Cough, unspecified: Secondary | ICD-10-CM | POA: Insufficient documentation

## 2018-02-02 DIAGNOSIS — J0100 Acute maxillary sinusitis, unspecified: Secondary | ICD-10-CM | POA: Diagnosis not present

## 2018-02-02 DIAGNOSIS — R05 Cough: Secondary | ICD-10-CM

## 2018-02-02 MED ORDER — AMOXICILLIN-POT CLAVULANATE 875-125 MG PO TABS: 1.0000 | ORAL_TABLET | Freq: Two times a day (BID) | ORAL | 0 refills | Status: AC

## 2018-02-02 MED ORDER — BENZONATATE 200 MG PO CAPS: 200.0000 mg | ORAL_CAPSULE | Freq: Two times a day (BID) | ORAL | 0 refills | Status: DC | PRN

## 2018-02-02 MED ORDER — PSEUDOEPHEDRINE-GUAIFENESIN ER 60-600 MG PO TB12: 1.0000 | ORAL_TABLET | Freq: Two times a day (BID) | ORAL | 1 refills | Status: AC

## 2018-02-02 NOTE — Patient Instructions (Addendum)
     If you have lab work done today you will be contacted with your lab results within the next 2 weeks.  If you have not heard from us then please contact us. The fastest way to get your results is to register for My Chart.   IF you received an x-ray today, you will receive an invoice from [facility]. Please contact [facility] at [phone] with questions or concerns regarding your invoice.   IF you received labwork today, you will receive an invoice from [facility]. Please contact [facility] at [phone] with questions or concerns regarding your invoice.   Our billing staff will not be able to assist you with questions regarding bills from these companies.  You will be contacted with the lab results as soon as they are available. The fastest way to get your results is to activate your My Chart account. Instructions are located on the last page of this paperwork. If you have not heard from us regarding the results in 2 weeks, please contact this office.     Cough, Adult A cough helps to clear your throat and lungs. A cough may last only 2-3 weeks (acute), or it may last longer than 8 weeks (chronic). Many different things can cause a cough. A cough may be a sign of an illness or another medical condition. Follow these instructions at home:  Pay attention to any changes in your cough.  Take medicines only as told by your doctor. ? If you were prescribed an antibiotic medicine, take it as told by your doctor. Do not stop taking it even if you start to feel better. ? Talk with your doctor before you try using a cough medicine.  Drink enough fluid to keep your pee (urine) clear or pale yellow.  If the air is dry, use a cold steam vaporizer or humidifier in your home.  Stay away from things that make you cough at work or at home.  If your cough is worse at night, try using extra pillows to raise your head up higher while you sleep.  Do not smoke, and try not to be  around smoke. If you need help quitting, ask your doctor.  Do not have caffeine.  Do not drink alcohol.  Rest as needed. Contact a doctor if:  You have new problems (symptoms).  You cough up yellow fluid (pus).  Your cough does not get better after 2-3 weeks, or your cough gets worse.  Medicine does not help your cough and you are not sleeping well.  You have pain that gets worse or pain that is not helped with medicine.  You have a fever.  You are losing weight and you do not know why.  You have night sweats. Get help right away if:  You cough up blood.  You have trouble breathing.  Your heartbeat is very fast. This information is not intended to replace advice given to you by your health care provider. Make sure you discuss any questions you have with your health care provider. Document Released: 10/30/2010 Document Revised: 07/25/2015 Document Reviewed: 04/25/2014 Elsevier Interactive Patient Education  2018 [employer]  

## 2018-02-02 NOTE — Progress Notes (Signed)
[place] 62 y.o.   Chief Complaint  Patient presents with  . Cough    x 4 days productive with green/gray mucus  . Sore Throat    HISTORY OF PRESENT ILLNESS: This is a 62 y.o. male complaining of sore throat, stuffy head, runny nose with yellow discharge, and cough productive of green phlegm that started 4 days ago.  No other significant symptoms.Marland Kitchen  HPI   Prior to Admission medications   Medication Sig Start Date End Date Taking? Authorizing Provider  B Complex Vitamins (VITAMIN-B COMPLEX PO) Take 1 tablet by mouth daily.   Yes [provider]  cetirizine (ZYRTEC) 10 MG tablet Take 1 tablet (10 mg total) by mouth daily. 12/30/17  Yes Stallings, Zoe A, MD  clonazePAM (KLONOPIN) 2 MG tablet Take 1.5 tablets (3 mg total) by mouth at bedtime. 12/30/17  Yes Forrest Moron, MD  lisinopril-hydrochlorothiazide (PRINZIDE,ZESTORETIC) 20-12.5 MG tablet Take 1 tablet by mouth  every evening 12/30/17  Yes Stallings, Zoe A, MD  naproxen sodium (ALEVE) 220 MG tablet Take 2 tablets (440 mg total) by mouth 2 (two) times daily with a meal. 12/30/17  Yes Stallings, Zoe A, MD  pantoprazole (PROTONIX) 40 MG tablet TAKE 1 TABLET (40 MG TOTAL) BY MOUTH DAILY. 12/30/17  Yes Forrest Moron, MD  Probiotic Product (PROBIOTIC DAILY PO) Take 1 tablet by mouth daily.    Yes [provider]  traMADol (ULTRAM) 50 MG tablet TAKE 1 TABLET (50 MG TOTAL) BY MOUTH 2 (TWO) TIMES DAILY AS NEEDED 12/30/17  Yes Delia Chimes A, MD  traZODone (DESYREL) 100 MG tablet Take 2 tablets by mouth at  bedtime 12/30/17  Yes Delia Chimes A, MD  TURMERIC PO Take by mouth.    [provider]    Allergies  Allergen Reactions  . Codeine Itching  . Hydrocodone Itching    Patient Active Problem List   Diagnosis Date Noted  . HTN (hypertension) 03/24/2016  . GERD (gastroesophageal reflux disease)   . Insomnia with sleep apnea 12/06/2013  . OSA on CPAP 12/06/2013  . Insomnia secondary to  restless leg syndrome 07/17/2013  . Circadian rhythm sleep disorder, shift work type 07/17/2013  . Osteoarthritis, multiple sites 04/26/2011  . Insomnia 04/26/2011    Past Medical History:  Diagnosis Date  . Allergy    seasonal  . Arthritis    mild  . Cancer ([place])    skin  . Circadian rhythm sleep disorder, shift work type 07/17/2013  . Complication of anesthesia    nausea and vomiting  . GERD (gastroesophageal reflux disease)   . Hypertension   . Insomnia secondary to restless leg syndrome 07/17/2013  . Kidney stones   . OSA on CPAP   . PONV (postoperative nausea and vomiting)   . Sleep apnea    cpap    Past Surgical History:  Procedure Laterality Date  . acl repair left knee    . BACK SURGERY  2017  . CARDIAC CATHETERIZATION  2005   negative   . CHOLECYSTECTOMY    . CYSTOSCOPY/RETROGRADE/URETEROSCOPY/STONE EXTRACTION WITH BASKET  04/07/2012   Procedure: CYSTOSCOPY/RETROGRADE/URETEROSCOPY/STONE EXTRACTION WITH BASKET;  Surgeon: Malka So, MD;  Location: [facility];  Service: Urology;  Laterality: Bilateral;  cystoscopy, bilateral stent removal, bilateral ureteroscopy with bilateral stone extraction and insertion of right ureteral stent  . fusion great toe right foot    . ligaments repair left wrist     . ROTATOR CUFF REPAIR  left shoulder   . skin cancer removed from left cheek    . stone extractions and urethral stents     multiple  . tendons cut on left heel    . TONSILLECTOMY    . VARICOCELE EXCISION      Social History   Socioeconomic History  . Marital status: Married    Spouse name: [name]  . Number of children: 2  . Years of education: 12+   . Highest education level: Not on file  Occupational History  . 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 Financial resource strain: Not on file  . Food insecurity:    Worry: Not on file    Inability: Not on file  . Transportation  needs:    Medical: Not on file    Non-medical: Not on file  Tobacco Use  . Smoking status: Never Smoker  . Smokeless tobacco: Never Used  Substance and Sexual Activity  . Alcohol use: No    Alcohol/week: 0.0 standard drinks  . Drug use: No  . Sexual activity: Yes  Lifestyle  . Physical activity:    Days per week: Not on file    Minutes per session: Not on file  . Stress: Not on file  Relationships  . Social connections:    Talks on phone: Not on file    Gets together: Not on file    Attends religious service: Not on file    Active member of club or organization: Not on file    Attends meetings of clubs or organizations: Not on file    Relationship status: Not on file  . Intimate partner violence:    Fear of current or ex partner: Not on file    Emotionally abused: Not on file    Physically abused: Not on file    Forced sexual activity: Not on file  Other Topics Concern  . 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   Patient is a retired Agricultural consultant.   Patient has two children.   Patient has 5 years of [employer].   Patient is right-handed.    Family History  Problem Relation Age of Onset  . Cancer Mother        bladder cancer  . Heart disease Mother        CABG  . Cancer Father        lung cancer after asbestosis  . Hypertension Father   . Diabetes Brother   . Colon cancer Neg Hx      Review of Systems  Constitutional: Negative.  Negative for chills and fever.  HENT: Positive for congestion, sinus pain and sore throat.   Eyes: Negative.  Negative for discharge and redness.  Respiratory: Positive for cough and sputum production. Negative for hemoptysis, shortness of breath and wheezing.   Cardiovascular: Negative.  Negative for chest pain, palpitations and leg swelling.  Gastrointestinal: Negative.  Negative for abdominal pain, constipation, diarrhea, nausea and vomiting.  Genitourinary: Negative.   Negative for dysuria and hematuria.  Musculoskeletal: Negative.  Negative for back pain, joint pain, myalgias and neck pain.  Skin: Negative for rash.  Neurological: Positive for headaches. Negative for sensory change, focal weakness and weakness.  Endo/Heme/Allergies: Negative.   All other systems reviewed and are negative.   Vitals:   02/02/18 1440  BP: 102/68  Pulse:  82  Resp: 16  Temp: 98.1 F (36.7 C)  SpO2: 95%    Physical Exam  Constitutional: He is oriented to person, place, and time. He appears well-developed and well-nourished.  HENT:  Head: Normocephalic and atraumatic.  Right Ear: External ear normal.  Left Ear: External ear normal.  Nose: Right sinus exhibits maxillary sinus tenderness. Left sinus exhibits maxillary sinus tenderness.  Mouth/Throat: Uvula is midline. No uvula swelling. Posterior oropharyngeal erythema present. No oropharyngeal exudate, posterior oropharyngeal edema or tonsillar abscesses.  Eyes: Pupils are equal, round, and reactive to light. Conjunctivae and EOM are normal.  Neck: Normal range of motion. Neck supple.  Cardiovascular: Normal rate, regular rhythm and normal heart sounds.  Pulmonary/Chest: Effort normal and breath sounds normal.  Musculoskeletal: Normal range of motion.  Neurological: He is alert and oriented to person, place, and time. No sensory deficit. He exhibits normal muscle tone.  Skin: Skin is warm and dry. Capillary refill takes less than 2 seconds.  Psychiatric: He has a normal mood and affect. His behavior is normal.  Vitals reviewed.   A total of 25 minutes was spent in the room with the patient, greater than 50% of which was in counseling/coordination of care regarding diagnosis, treatment, medications, prognosis, and need for follow-up if no better or worse.  ASSESSMENT & PLAN: Casey [name] was seen today for cough and sore throat.  Diagnoses and all orders for this visit:  Cough -     benzonatate (TESSALON) 200 MG  capsule; Take 1 capsule (200 mg total) by mouth 2 (two) times daily as needed for cough.  Sinus congestion -     pseudoephedrine-guaifenesin (MUCINEX D) 60-600 MG 12 hr tablet; Take 1 tablet by mouth every 12 (twelve) hours for 5 days.  Acute non-recurrent maxillary sinusitis -     amoxicillin-clavulanate (AUGMENTIN) 875-125 MG tablet; Take 1 tablet by mouth 2 (two) times daily for 7 days.    Patient Instructions       If you have lab work done today you will be contacted with your lab results within the next 2 weeks.  If you have not heard from [place] then please contact us. The fastest way to get your results is to register for My Chart.   IF you received an x-ray today, you will receive an invoice from [facility]. Please contact [facility] at [phone] with questions or concerns regarding your invoice.   IF you received labwork today, you will receive an invoice from [facility]. Please contact [facility] at [phone] with questions or concerns regarding your invoice.   Our billing staff will not be able to assist you with questions regarding bills from these companies.  You will be contacted with the lab results as soon as they are available. The fastest way to get your results is to activate your My Chart account. Instructions are located on the last page of this paperwork. If you have not heard from [place] regarding the results in 2 weeks, please contact this office.     Cough, Adult A cough helps to clear your throat and lungs. A cough may last only 2-3 weeks (acute), or it may last longer than 8 weeks (chronic). Many different things can cause a cough. A cough may be a sign of an illness or another medical condition. Follow these instructions at home:  Pay attention to any changes in your cough.  Take medicines only as told by your doctor. ? If you were prescribed an antibiotic medicine, take it as  told by your doctor. Do not stop taking it even if you start  to feel better. ? Talk with your doctor before you try using a cough medicine.  Drink enough fluid to keep your pee (urine) clear or pale yellow.  If the air is dry, use a cold steam vaporizer or humidifier in your home.  Stay away from things that make you cough at work or at home.  If your cough is worse at night, try using extra pillows to raise your head up higher while you sleep.  Do not smoke, and try not to be around smoke. If you need help quitting, ask your doctor.  Do not have caffeine.  Do not drink alcohol.  Rest as needed. Contact a doctor if:  You have new problems (symptoms).  You cough up yellow fluid (pus).  Your cough does not get better after 2-3 weeks, or your cough gets worse.  Medicine does not help your cough and you are not sleeping well.  You have pain that gets worse or pain that is not helped with medicine.  You have a fever.  You are losing weight and you do not know why.  You have night sweats. Get help right away if:  You cough up blood.  You have trouble breathing.  Your heartbeat is very fast. This information is not intended to replace advice given to you by your health care provider. Make sure you discuss any questions you have with your health care provider. Document Released: 10/30/2010 Document Revised: 07/25/2015 Document Reviewed: 04/25/2014 Elsevier Interactive Patient Education  2018 [employer] Inc.      Agustina Caroli, MD [facility]

## 2018-02-08 NOTE — Telephone Encounter (Signed)
This encounter was created in error - please disregard.

## 2018-02-26 ENCOUNTER — Emergency Department (HOSPITAL_COMMUNITY): Payer: BC Managed Care – PPO

## 2018-02-26 ENCOUNTER — Observation Stay (HOSPITAL_COMMUNITY)
Admission: EM | Admit: 2018-02-26 | Discharge: 2018-02-28 | Disposition: A | Discharge status: Home / Self Care | Payer: BC Managed Care – PPO | Attending: Cardiovascular Disease | Admitting: Cardiovascular Disease

## 2018-02-26 ENCOUNTER — Encounter (HOSPITAL_COMMUNITY): Payer: Self-pay | Admitting: Radiology

## 2018-02-26 DIAGNOSIS — I2510 Atherosclerotic heart disease of native coronary artery without angina pectoris: Secondary | ICD-10-CM | POA: Diagnosis not present

## 2018-02-26 DIAGNOSIS — Z87442 Personal history of urinary calculi: Secondary | ICD-10-CM | POA: Insufficient documentation

## 2018-02-26 DIAGNOSIS — F5101 Primary insomnia: Secondary | ICD-10-CM | POA: Diagnosis not present

## 2018-02-26 DIAGNOSIS — Z9989 Dependence on other enabling machines and devices: Secondary | ICD-10-CM | POA: Diagnosis not present

## 2018-02-26 DIAGNOSIS — Z8249 Family history of ischemic heart disease and other diseases of the circulatory system: Secondary | ICD-10-CM | POA: Insufficient documentation

## 2018-02-26 DIAGNOSIS — I469 Cardiac arrest, cause unspecified: Secondary | ICD-10-CM | POA: Diagnosis present

## 2018-02-26 DIAGNOSIS — G4733 Obstructive sleep apnea (adult) (pediatric): Secondary | ICD-10-CM | POA: Diagnosis not present

## 2018-02-26 DIAGNOSIS — Z79899 Other long term (current) drug therapy: Secondary | ICD-10-CM | POA: Diagnosis not present

## 2018-02-26 DIAGNOSIS — W19XXXA Unspecified fall, initial encounter: Secondary | ICD-10-CM

## 2018-02-26 DIAGNOSIS — M25552 Pain in left hip: Secondary | ICD-10-CM | POA: Diagnosis not present

## 2018-02-26 DIAGNOSIS — W11XXXA Fall on and from ladder, initial encounter: Secondary | ICD-10-CM | POA: Diagnosis not present

## 2018-02-26 DIAGNOSIS — K219 Gastro-esophageal reflux disease without esophagitis: Secondary | ICD-10-CM | POA: Diagnosis not present

## 2018-02-26 DIAGNOSIS — M25559 Pain in unspecified hip: Secondary | ICD-10-CM

## 2018-02-26 DIAGNOSIS — I129 Hypertensive chronic kidney disease with stage 1 through stage 4 chronic kidney disease, or unspecified chronic kidney disease: Secondary | ICD-10-CM | POA: Insufficient documentation

## 2018-02-26 DIAGNOSIS — N182 Chronic kidney disease, stage 2 (mild): Secondary | ICD-10-CM | POA: Diagnosis not present

## 2018-02-26 DIAGNOSIS — R0789 Other chest pain: Secondary | ICD-10-CM | POA: Insufficient documentation

## 2018-02-26 DIAGNOSIS — Y93H2 Activity, gardening and landscaping: Secondary | ICD-10-CM | POA: Diagnosis not present

## 2018-02-26 DIAGNOSIS — Z85828 Personal history of other malignant neoplasm of skin: Secondary | ICD-10-CM | POA: Diagnosis not present

## 2018-02-26 DIAGNOSIS — Z791 Long term (current) use of non-steroidal anti-inflammatories (NSAID): Secondary | ICD-10-CM | POA: Diagnosis not present

## 2018-02-26 DIAGNOSIS — R55 Syncope and collapse: Secondary | ICD-10-CM | POA: Diagnosis present

## 2018-02-26 HISTORY — DX: Cardiac arrest, cause unspecified: I46.9

## 2018-02-26 LAB — COMPREHENSIVE METABOLIC PANEL
ALT: 30 U/L (ref 0–44)
AST: 27 U/L (ref 15–41)
Albumin: 3.7 g/dL (ref 3.5–5.0)
Alkaline Phosphatase: 61 U/L (ref 38–126)
Anion gap: 9 (ref 5–15)
BILIRUBIN TOTAL: 0.8 mg/dL (ref 0.3–1.2)
BUN: 27 mg/dL — ABNORMAL HIGH (ref 8–23)
CO2: 21 mmol/L — ABNORMAL LOW (ref 22–32)
Calcium: 8.7 mg/dL — ABNORMAL LOW (ref 8.9–10.3)
Chloride: 111 mmol/L (ref 98–111)
Creatinine, Ser: 1.57 mg/dL — ABNORMAL HIGH (ref 0.61–1.24)
GFR calc Af Amer: 54 mL/min — ABNORMAL LOW (ref >60)
GFR calc non Af Amer: 46 mL/min — ABNORMAL LOW (ref >60)
Glucose, Bld: 114 mg/dL — ABNORMAL HIGH (ref 70–99)
Potassium: 3.6 mmol/L (ref 3.5–5.1)
Sodium: 141 mmol/L (ref 135–145)
Total Protein: 5.9 g/dL — ABNORMAL LOW (ref 6.5–8.1)

## 2018-02-26 LAB — I-STAT CG4 LACTIC ACID, ED: Lactic Acid, Venous: 1.09 mmol/L (ref 0.5–1.9)

## 2018-02-26 LAB — CBC
HCT: 40.5 % (ref 39.0–52.0)
HCT: 40.7 % (ref 39.0–52.0)
Hemoglobin: 13.5 g/dL (ref 13.0–17.0)
Hemoglobin: 13.6 g/dL (ref 13.0–17.0)
MCH: 30.5 pg (ref 26.0–34.0)
MCH: 30.7 pg (ref 26.0–34.0)
MCHC: 33.3 g/dL (ref 30.0–36.0)
MCHC: 33.4 g/dL (ref 30.0–36.0)
MCV: 91.6 fL (ref 80.0–100.0)
MCV: 91.9 fL (ref 80.0–100.0)
NRBC: 0 % (ref 0.0–0.2)
NRBC: 0 % (ref 0.0–0.2)
PLATELETS: 185 10*3/uL (ref 150–400)
Platelets: 200 10*3/uL (ref 150–400)
RBC: 4.42 MIL/uL (ref 4.22–5.81)
RBC: 4.43 MIL/uL (ref 4.22–5.81)
RDW: 12.8 % (ref 11.5–15.5)
RDW: 12.8 % (ref 11.5–15.5)
WBC: 9.3 10*3/uL (ref 4.0–10.5)
WBC: 9.7 10*3/uL (ref 4.0–10.5)

## 2018-02-26 LAB — CREATININE, SERUM
CREATININE: 1.4 mg/dL — AB (ref 0.61–1.24)
GFR calc Af Amer: >60 mL/min (ref >60)
GFR calc non Af Amer: 53 mL/min — ABNORMAL LOW (ref >60)

## 2018-02-26 LAB — URINALYSIS, ROUTINE W REFLEX MICROSCOPIC
Bacteria, UA: NONE SEEN
Bilirubin Urine: NEGATIVE
Glucose, UA: NEGATIVE mg/dL
Ketones, ur: 20 mg/dL — AB
Leukocytes, UA: NEGATIVE
NITRITE: NEGATIVE
PH: 5 (ref 5.0–8.0)
Protein, ur: NEGATIVE mg/dL
SPECIFIC GRAVITY, URINE: 1.021 (ref 1.005–1.030)

## 2018-02-26 LAB — ETHANOL: Alcohol, Ethyl (B): <10 mg/dL (ref <10)

## 2018-02-26 LAB — I-STAT CHEM 8, ED
BUN: 29 mg/dL — AB (ref 8–23)
Calcium, Ion: 1.16 mmol/L (ref 1.15–1.40)
Chloride: 108 mmol/L (ref 98–111)
Creatinine, Ser: 1.5 mg/dL — ABNORMAL HIGH (ref 0.61–1.24)
Glucose, Bld: 106 mg/dL — ABNORMAL HIGH (ref 70–99)
HCT: 39 % (ref 39.0–52.0)
Hemoglobin: 13.3 g/dL (ref 13.0–17.0)
Potassium: 3.6 mmol/L (ref 3.5–5.1)
Sodium: 142 mmol/L (ref 135–145)
TCO2: 23 mmol/L (ref 22–32)

## 2018-02-26 LAB — I-STAT TROPONIN, ED: TROPONIN I, POC: 0 ng/mL (ref 0.00–0.08)

## 2018-02-26 LAB — CDS SEROLOGY

## 2018-02-26 LAB — PROTIME-INR
INR: 1.09
Prothrombin Time: 14 seconds (ref 11.4–15.2)

## 2018-02-26 LAB — SAMPLE TO BLOOD BANK

## 2018-02-26 LAB — TROPONIN I: Troponin I: <0.03 ng/mL (ref <0.03)

## 2018-02-26 MED ORDER — HEPARIN SODIUM (PORCINE) 5000 UNIT/ML IJ SOLN
5000.0000 [IU] | Freq: Three times a day (TID) | INTRAMUSCULAR | Status: DC
  Administered 2018-02-27 – 2018-02-28 (×5): 5000 [IU] via SUBCUTANEOUS
  Filled 2018-02-26 (×6): qty 1

## 2018-02-26 MED ORDER — IOPAMIDOL (ISOVUE-370) INJECTION 76%
INTRAVENOUS | Status: AC
  Administered 2018-02-26: 100 mL
  Filled 2018-02-26: qty 100

## 2018-02-26 MED ORDER — ACETAMINOPHEN 650 MG RE SUPP: 650.0000 mg | Freq: Four times a day (QID) | RECTAL | Status: DC | PRN

## 2018-02-26 MED ORDER — SODIUM CHLORIDE 0.9% FLUSH
3.0000 mL | Freq: Two times a day (BID) | INTRAVENOUS | Status: DC
  Administered 2018-02-26 – 2018-02-28 (×4): 3 mL via INTRAVENOUS

## 2018-02-26 MED ORDER — SODIUM CHLORIDE 0.9% FLUSH: 3.0000 mL | INTRAVENOUS | Status: DC | PRN

## 2018-02-26 MED ORDER — ACETAMINOPHEN 325 MG PO TABS
650.0000 mg | ORAL_TABLET | Freq: Four times a day (QID) | ORAL | Status: DC | PRN
  Administered 2018-02-27 (×2): 650 mg via ORAL
  Filled 2018-02-26 (×2): qty 2

## 2018-02-26 MED ORDER — SODIUM CHLORIDE 0.9 % IV SOLN: 250.0000 mL | INTRAVENOUS | Status: DC | PRN

## 2018-02-26 MED ORDER — SODIUM CHLORIDE 0.9 % IV BOLUS
10.0000 mL/kg | Freq: Once | INTRAVENOUS | Status: AC
  Administered 2018-02-26: 953 mL via INTRAVENOUS

## 2018-02-26 MED ORDER — FENTANYL CITRATE (PF) 100 MCG/2ML IJ SOLN
25.0000 ug | Freq: Once | INTRAMUSCULAR | Status: AC
  Administered 2018-02-26: 25 ug via INTRAVENOUS
  Filled 2018-02-26: qty 2

## 2018-02-26 NOTE — ED Provider Notes (Signed)
[facility] Provider Note   CSN: [id number] Arrival date & time: 02/26/18  1459     History   Chief Complaint Chief Complaint  Patient presents with  . Post Arrest  . Fall    HPI Lauris Serviss. is a 62 y.o. male.  He is currently not on anticoagulation.  He said he was on a ladder cutting tree limbs with a chainsaw.  Limb fell and struck the ladder knocking him off a ladder about 10 feet to the ground.  He thinks he lost consciousness briefly.  His family was with him and his son assisted him back up.  Noticed some soreness in his leg on the left.  He was going to refuse EMS transport but ultimately was transported.  During transport EMS said he bradycardia down and became systolic for about a minute in which they gave him CPR.  The patient regained consciousness and asked what they were doing.  He is complaining of a little chest soreness now.  He said he had a syncopal event one time in the past when he was in high school.  The history is provided by the patient.  Fall  This is a new problem. The current episode started 1 to 2 hours ago. The problem has been rapidly improving. Associated symptoms include chest pain. Pertinent negatives include no abdominal pain, no headaches and no shortness of breath. Nothing aggravates the symptoms. Nothing relieves the symptoms. He has tried nothing for the symptoms. The treatment provided no relief.  Cardiac Arrest  Witnessed by:  Healthcare provider Incident location:  En route to the ED Time since incident:  20 minutes Time before ALS initiated:  Immediate Condition upon EMS arrival:  Alert/oriented Pulse:  Present Initial cardiac rhythm per EMS:  Normal sinus Treatments prior to arrival:  ACLS protocol and vascular access IV access type:  Peripheral Airway:  Bag valve mask Rhythm on admission to ED:  Normal sinus Associated symptoms: chest pain and loss of consciousness   Associated symptoms: no  shortness of breath   Risk factors: head injury     Past Medical History:  Diagnosis Date  . Allergy    seasonal  . Arthritis    mild  . Cancer ([place])    skin  . Circadian rhythm sleep disorder, shift work type 07/17/2013  . Complication of anesthesia    nausea and vomiting  . GERD (gastroesophageal reflux disease)   . Hypertension   . Insomnia secondary to restless leg syndrome 07/17/2013  . Kidney stones   . OSA on CPAP   . PONV (postoperative nausea and vomiting)   . Sleep apnea    cpap    Patient Active Problem List   Diagnosis Date Noted  . Cough 02/02/2018  . Sinus congestion 02/02/2018  . Acute non-recurrent maxillary sinusitis 02/02/2018  . HTN (hypertension) 03/24/2016  . GERD (gastroesophageal reflux disease)   . Insomnia with sleep apnea 12/06/2013  . OSA on CPAP 12/06/2013  . Insomnia secondary to restless leg syndrome 07/17/2013  . Circadian rhythm sleep disorder, shift work type 07/17/2013  . Osteoarthritis, multiple sites 04/26/2011  . Insomnia 04/26/2011    Past Surgical History:  Procedure Laterality Date  . acl repair left knee    . BACK SURGERY  2017  . CARDIAC CATHETERIZATION  2005   negative   . CHOLECYSTECTOMY    . CYSTOSCOPY/RETROGRADE/URETEROSCOPY/STONE EXTRACTION WITH BASKET  04/07/2012   Procedure: CYSTOSCOPY/RETROGRADE/URETEROSCOPY/STONE EXTRACTION WITH BASKET;  Surgeon:  Malka So, MD;  Location: [facility];  Service: Urology;  Laterality: Bilateral;  cystoscopy, bilateral stent removal, bilateral ureteroscopy with bilateral stone extraction and insertion of right ureteral stent  . fusion great toe right foot    . ligaments repair left wrist     . ROTATOR CUFF REPAIR     left shoulder   . skin cancer removed from left cheek    . stone extractions and urethral stents     multiple  . tendons cut on left heel    . TONSILLECTOMY    . VARICOCELE EXCISION          Home Medications    Prior to Admission medications   Medication Sig  Start Date End Date Taking? Authorizing Provider  B Complex Vitamins (VITAMIN-B COMPLEX PO) Take 1 tablet by mouth daily.    [provider]  benzonatate (TESSALON) 200 MG capsule Take 1 capsule (200 mg total) by mouth 2 (two) times daily as needed for cough. 02/02/18   Horald Pollen, MD  cetirizine (ZYRTEC) 10 MG tablet Take 1 tablet (10 mg total) by mouth daily. 12/30/17   Forrest Moron, MD  clonazePAM (KLONOPIN) 2 MG tablet Take 1.5 tablets (3 mg total) by mouth at bedtime. 12/30/17   Forrest Moron, MD  lisinopril-hydrochlorothiazide (PRINZIDE,ZESTORETIC) 20-12.5 MG tablet Take 1 tablet by mouth  every evening 12/30/17   Delia Chimes A, MD  naproxen sodium (ALEVE) 220 MG tablet Take 2 tablets (440 mg total) by mouth 2 (two) times daily with a meal. 12/30/17   Stallings, Zoe A, MD  pantoprazole (PROTONIX) 40 MG tablet TAKE 1 TABLET (40 MG TOTAL) BY MOUTH DAILY. 12/30/17   Forrest Moron, MD  Probiotic Product (PROBIOTIC DAILY PO) Take 1 tablet by mouth daily.     [provider]  traMADol (ULTRAM) 50 MG tablet TAKE 1 TABLET (50 MG TOTAL) BY MOUTH 2 (TWO) TIMES DAILY AS NEEDED 12/30/17   Forrest Moron, MD  traZODone (DESYREL) 100 MG tablet Take 2 tablets by mouth at  bedtime 12/30/17   Forrest Moron, MD  TURMERIC PO Take by mouth.    [provider]    Family History Family History  Problem Relation Age of Onset  . Cancer Mother        bladder cancer  . Heart disease Mother        CABG  . Cancer Father        lung cancer after asbestosis  . Hypertension Father   . Diabetes Brother   . Colon cancer Neg Hx     Social History Social History   Tobacco Use  . Smoking status: Never Smoker  . Smokeless tobacco: Never Used  Substance Use Topics  . Alcohol use: No    Alcohol/week: 0.0 standard drinks  . Drug use: No     Allergies   Codeine and Hydrocodone   Review of Systems Review of Systems  Constitutional: Negative for fever.   HENT: Negative for sore throat.   Eyes: Negative for visual disturbance.  Respiratory: Negative for shortness of breath.   Cardiovascular: Positive for chest pain.  Gastrointestinal: Negative for abdominal pain.  Genitourinary: Negative for dysuria.  Musculoskeletal: Positive for neck pain.  Skin: Negative for rash and wound.  Neurological: Positive for loss of consciousness. Negative for headaches.     Physical Exam Updated Vital Signs BP 103/64   Pulse 79   Temp 98.7 F (37.1 C) (Oral)  Resp 18   Wt 95.3 kg   SpO2 96%   BMI 34.95 kg/m   Physical Exam Vitals signs and nursing note reviewed.  Constitutional:      Appearance: He is well-developed.  HENT:     Head: Normocephalic and atraumatic.  Eyes:     Conjunctiva/sclera: Conjunctivae normal.  Neck:     Musculoskeletal: Muscular tenderness present.     Comments: Mild upper C-spine midline tenderness.  Patient placed in c-collar. Cardiovascular:     Rate and Rhythm: Normal rate and regular rhythm.     Heart sounds: No murmur.  Pulmonary:     Effort: Pulmonary effort is normal. No respiratory distress.     Breath sounds: Normal breath sounds.  Abdominal:     Palpations: Abdomen is soft.     Tenderness: There is no abdominal tenderness.  Musculoskeletal: Normal range of motion.        General: No deformity.  Skin:    General: Skin is warm and dry.     Capillary Refill: Capillary refill takes less than 2 seconds.  Neurological:     General: No focal deficit present.     Mental Status: He is alert and oriented to person, place, and time.     Cranial Nerves: No cranial nerve deficit.     Sensory: No sensory deficit.     Motor: No weakness.  Psychiatric:        Mood and Affect: Mood normal.      ED Treatments / Results  Labs (all labs ordered are listed, but only abnormal results are displayed) Labs Reviewed  COMPREHENSIVE METABOLIC PANEL - Abnormal; Notable for the following components:      Result  Value   CO2 21 (*)    Glucose, Bld 114 (*)    BUN 27 (*)    Creatinine, Ser 1.57 (*)    Calcium 8.7 (*)    Total Protein 5.9 (*)    GFR calc non Af Amer 46 (*)    GFR calc Af Amer 54 (*)    All other components within normal limits  URINALYSIS, ROUTINE W REFLEX MICROSCOPIC - Abnormal; Notable for the following components:   APPearance HAZY (*)    Hgb urine dipstick MODERATE (*)    Ketones, ur 20 (*)    All other components within normal limits  CREATININE, SERUM - Abnormal; Notable for the following components:   Creatinine, Ser 1.40 (*)    GFR calc non Af Amer 53 (*)    All other components within normal limits  BASIC METABOLIC PANEL - Abnormal; Notable for the following components:   Creatinine, Ser 1.37 (*)    Calcium 8.7 (*)    GFR calc non Af Amer 55 (*)    All other components within normal limits  CBC - Abnormal; Notable for the following components:   RBC 4.17 (*)    HCT 38.5 (*)    All other components within normal limits  LIPID PANEL - Abnormal; Notable for the following components:   HDL 28 (*)    All other components within normal limits  I-STAT CHEM 8, ED - Abnormal; Notable for the following components:   BUN 29 (*)    Creatinine, Ser 1.50 (*)    Glucose, Bld 106 (*)    All other components within normal limits  CDS SEROLOGY  CBC  ETHANOL  PROTIME-INR  CBC  TROPONIN I  TROPONIN I  TROPONIN I  HIV ANTIBODY (ROUTINE TESTING W REFLEX)  I-STAT CG4 LACTIC ACID, ED  I-STAT TROPONIN, ED  SAMPLE TO BLOOD BANK    EKG EKG Interpretation  Date/Time:  Saturday February 26 2018 15:01:55 EST Ventricular Rate:  78 PR Interval:    QRS Duration: 99 QT Interval:  389 QTC Calculation: 444 R Axis:   50 Text Interpretation:  Sinus rhythm Minimal ST elevation, inferior leads similar to prior 8/17 Confirmed by Aletta Edouard [phone]) on 02/26/2018 3:07:50 PM   Radiology Dg Tibia/fibula Left  Result Date: 02/26/2018 CLINICAL DATA:  Status post fall fifteen  feet the today. EXAM: LEFT TIBIA AND FIBULA - 2 VIEW COMPARISON:  None. FINDINGS: There is no evidence of fracture or dislocation. Soft tissues are unremarkable. IMPRESSION: No acute fracture or dislocation. Electronically Signed   By: Abelardo Diesel M.D.   On: 02/26/2018 19:56   Ct Head Wo Contrast  Result Date: 02/26/2018 CLINICAL DATA:  Cutting a tree limb, the limb hit the ladder pt was standing on causing him to fall to the ground. Fell approx 12-15ft. Positive LOC. While en-route to the hospital pt went into cardiac arrest. Pt c/o chest pain at this time. EXAM: CT HEAD WITHOUT CONTRAST CT CERVICAL SPINE WITHOUT CONTRAST TECHNIQUE: Multidetector CT imaging of the head and cervical spine was performed following the standard protocol without intravenous contrast. Multiplanar CT image reconstructions of the cervical spine were also generated. COMPARISON:  10/03/2015 FINDINGS: CT HEAD FINDINGS Brain: No evidence of acute infarction, hemorrhage, hydrocephalus, extra-axial collection or mass lesion/mass effect. Vascular: No hyperdense vessel or unexpected calcification. Skull: Normal. Negative for fracture or focal lesion. Sinuses/Orbits: There is mucosal thickening of the RIGHT axillary and scattered ethmoid air cells. No evidence for sinus wall fracture. Other: None. CT CERVICAL SPINE FINDINGS Alignment: Normal. Skull base and vertebrae: No acute fracture. No primary bone lesion or focal pathologic process. Soft tissues and spinal canal: No prevertebral fluid or swelling. No visible canal hematoma. Disc levels:  Normal Upper chest: Negative. Other: There is a low-attenuation partially calcified mass within the UPPER pole of the RIGHT lobe of the thyroid, measuring 1.7 x 1.3 x 2.0 centimeters. IMPRESSION: 1. No evidence for acute intracranial abnormality. 2. Mild sinus disease. 3. No evidence for acute cervical spine abnormality. 4. Partially calcified mass within the UPPER pole of the RIGHT lobe of the thyroid.  Recommend further evaluation with thyroid ultrasound based on consensus criteria. Electronically Signed   By: Nolon Nations M.D.   On: 02/26/2018 17:33   Ct Angio Chest Pe W/cm &/or Wo Cm  Result Date: 02/26/2018 CLINICAL DATA:  62 year old male with history of trauma from a fall off of a ladder approximately 12-15 feet. Loss of consciousness. Cardiac arrest on the way to the hospital. Current chest pain. EXAM: CT ANGIOGRAPHY CHEST CT ABDOMEN AND PELVIS WITH CONTRAST TECHNIQUE: Multidetector CT imaging of the chest was performed using the standard protocol during bolus administration of intravenous contrast. Multiplanar CT image reconstructions and MIPs were obtained to evaluate the vascular anatomy. Multidetector CT imaging of the abdomen and pelvis was performed using the standard protocol during bolus administration of intravenous contrast. CONTRAST:  167mL ISOVUE-370 IOPAMIDOL (ISOVUE-370) INJECTION 76% COMPARISON:  CT the abdomen and pelvis 04/09/2012. FINDINGS: CTA CHEST FINDINGS Cardiovascular: No filling defects in the pulmonary arterial tree to suggest underlying pulmonary embolism. Heart size is normal. There is no significant pericardial fluid, thickening or pericardial calcification. No acute abnormality of the thoracic aorta or great vessels of the mediastinum. Specifically, no evidence of posttraumatic aortic transection or  dissection. Mediastinum/Nodes: No high attenuation fluid collection within the mediastinum to suggest posttraumatic mediastinal hematoma. No pathologically enlarged mediastinal or hilar lymph nodes. Esophagus is unremarkable in appearance. No axillary lymphadenopathy. Lungs/Pleura: Mild diffuse ground-glass attenuation throughout the lungs bilaterally. No confluent consolidative airspace disease. No pleural effusions. No pneumothorax. No suspicious appearing pulmonary nodules or masses. Musculoskeletal: No acute displaced fractures or aggressive appearing lytic or blastic  lesions are noted in the visualized portions of the skeleton. Review of the MIP images confirms the above findings. CT ABDOMEN and PELVIS FINDINGS Hepatobiliary: No signs of acute traumatic injury to the liver. No suspicious cystic or solid hepatic lesions. No intra or extrahepatic biliary ductal dilatation. Status post cholecystectomy. Pancreas: No evidence of acute traumatic injury to the pancreas. No pancreatic mass. No pancreatic ductal dilatation. No pancreatic or peripancreatic fluid or inflammatory changes. Spleen: No evidence of acute traumatic injury to the spleen. Adrenals/Urinary Tract: No evidence of acute traumatic injury to either kidney or adrenal gland. Several nonobstructive calculi are noted within the left renal collecting system measuring up to 10 mm in the interpolar region. Low-attenuation lesions in the right kidney, compatible with simple cysts measuring up to 2.7 cm in the interpolar region. No hydroureteronephrosis. Urinary bladder is normal in appearance. Left adrenal gland is normal in appearance. 2 cm right adrenal nodule in the lateral limb, stable compared to the prior study from 2014, previously characterized as an adenoma. Stomach/Bowel: No signs of acute traumatic injury to the hollow viscera. Normal appearance of the stomach. No pathologic dilatation of small bowel or colon. Normal appendix. Vascular/Lymphatic: No evidence of acute traumatic injury to the abdominal aorta or pelvic vasculature. No significant atherosclerotic disease, aneurysm or dissection noted in the abdominal or pelvic vasculature. No lymphadenopathy noted in the abdomen or pelvis. Reproductive: Prostate gland and seminal vesicles are unremarkable in appearance. Other: No high attenuation fluid collection in the peritoneal cavity or retroperitoneum to suggest posttraumatic hemorrhage. No significant volume of ascites. No pneumoperitoneum. Musculoskeletal: No acute displaced fractures or aggressive appearing lytic  or blastic lesions are noted in the visualized portions of the skeleton. Review of the MIP images confirms the above findings. IMPRESSION: 1. No evidence of significant acute traumatic injury to the chest, abdomen or pelvis. 2. No evidence of pulmonary embolism. 3. Mild diffuse ground-glass attenuation throughout the lungs bilaterally, suggestive of mild interstitial pulmonary edema. 4. Nonobstructive calculi in the collecting system of left kidney measuring up to 10 mm in the interpolar region. 5. Right adrenal adenoma, similar to prior studies. Electronically Signed   By: Vinnie Langton M.D.   On: 02/26/2018 17:42   Ct Cervical Spine Wo Contrast  Result Date: 02/26/2018 CLINICAL DATA:  Cutting a tree limb, the limb hit the ladder pt was standing on causing him to fall to the ground. Fell approx 12-35ft. Positive LOC. While en-route to the hospital pt went into cardiac arrest. Pt c/o chest pain at this time. EXAM: CT HEAD WITHOUT CONTRAST CT CERVICAL SPINE WITHOUT CONTRAST TECHNIQUE: Multidetector CT imaging of the head and cervical spine was performed following the standard protocol without intravenous contrast. Multiplanar CT image reconstructions of the cervical spine were also generated. COMPARISON:  10/03/2015 FINDINGS: CT HEAD FINDINGS Brain: No evidence of acute infarction, hemorrhage, hydrocephalus, extra-axial collection or mass lesion/mass effect. Vascular: No hyperdense vessel or unexpected calcification. Skull: Normal. Negative for fracture or focal lesion. Sinuses/Orbits: There is mucosal thickening of the RIGHT axillary and scattered ethmoid air cells. No evidence for sinus wall fracture.  Other: None. CT CERVICAL SPINE FINDINGS Alignment: Normal. Skull base and vertebrae: No acute fracture. No primary bone lesion or focal pathologic process. Soft tissues and spinal canal: No prevertebral fluid or swelling. No visible canal hematoma. Disc levels:  Normal Upper chest: Negative. Other: There is a  low-attenuation partially calcified mass within the UPPER pole of the RIGHT lobe of the thyroid, measuring 1.7 x 1.3 x 2.0 centimeters. IMPRESSION: 1. No evidence for acute intracranial abnormality. 2. Mild sinus disease. 3. No evidence for acute cervical spine abnormality. 4. Partially calcified mass within the UPPER pole of the RIGHT lobe of the thyroid. Recommend further evaluation with thyroid ultrasound based on consensus criteria. Electronically Signed   By: Nolon Nations M.D.   On: 02/26/2018 17:33   Ct Abdomen Pelvis W Contrast  Result Date: 02/26/2018 CLINICAL DATA:  62 year old male with history of trauma from a fall off of a ladder approximately 12-15 feet. Loss of consciousness. Cardiac arrest on the way to the hospital. Current chest pain. EXAM: CT ANGIOGRAPHY CHEST CT ABDOMEN AND PELVIS WITH CONTRAST TECHNIQUE: Multidetector CT imaging of the chest was performed using the standard protocol during bolus administration of intravenous contrast. Multiplanar CT image reconstructions and MIPs were obtained to evaluate the vascular anatomy. Multidetector CT imaging of the abdomen and pelvis was performed using the standard protocol during bolus administration of intravenous contrast. CONTRAST:  137mL ISOVUE-370 IOPAMIDOL (ISOVUE-370) INJECTION 76% COMPARISON:  CT the abdomen and pelvis 04/09/2012. FINDINGS: CTA CHEST FINDINGS Cardiovascular: No filling defects in the pulmonary arterial tree to suggest underlying pulmonary embolism. Heart size is normal. There is no significant pericardial fluid, thickening or pericardial calcification. No acute abnormality of the thoracic aorta or great vessels of the mediastinum. Specifically, no evidence of posttraumatic aortic transection or dissection. Mediastinum/Nodes: No high attenuation fluid collection within the mediastinum to suggest posttraumatic mediastinal hematoma. No pathologically enlarged mediastinal or hilar lymph nodes. Esophagus is unremarkable in  appearance. No axillary lymphadenopathy. Lungs/Pleura: Mild diffuse ground-glass attenuation throughout the lungs bilaterally. No confluent consolidative airspace disease. No pleural effusions. No pneumothorax. No suspicious appearing pulmonary nodules or masses. Musculoskeletal: No acute displaced fractures or aggressive appearing lytic or blastic lesions are noted in the visualized portions of the skeleton. Review of the MIP images confirms the above findings. CT ABDOMEN and PELVIS FINDINGS Hepatobiliary: No signs of acute traumatic injury to the liver. No suspicious cystic or solid hepatic lesions. No intra or extrahepatic biliary ductal dilatation. Status post cholecystectomy. Pancreas: No evidence of acute traumatic injury to the pancreas. No pancreatic mass. No pancreatic ductal dilatation. No pancreatic or peripancreatic fluid or inflammatory changes. Spleen: No evidence of acute traumatic injury to the spleen. Adrenals/Urinary Tract: No evidence of acute traumatic injury to either kidney or adrenal gland. Several nonobstructive calculi are noted within the left renal collecting system measuring up to 10 mm in the interpolar region. Low-attenuation lesions in the right kidney, compatible with simple cysts measuring up to 2.7 cm in the interpolar region. No hydroureteronephrosis. Urinary bladder is normal in appearance. Left adrenal gland is normal in appearance. 2 cm right adrenal nodule in the lateral limb, stable compared to the prior study from 2014, previously characterized as an adenoma. Stomach/Bowel: No signs of acute traumatic injury to the hollow viscera. Normal appearance of the stomach. No pathologic dilatation of small bowel or colon. Normal appendix. Vascular/Lymphatic: No evidence of acute traumatic injury to the abdominal aorta or pelvic vasculature. No significant atherosclerotic disease, aneurysm or dissection noted in the  abdominal or pelvic vasculature. No lymphadenopathy noted in the  abdomen or pelvis. Reproductive: Prostate gland and seminal vesicles are unremarkable in appearance. Other: No high attenuation fluid collection in the peritoneal cavity or retroperitoneum to suggest posttraumatic hemorrhage. No significant volume of ascites. No pneumoperitoneum. Musculoskeletal: No acute displaced fractures or aggressive appearing lytic or blastic lesions are noted in the visualized portions of the skeleton. Review of the MIP images confirms the above findings. IMPRESSION: 1. No evidence of significant acute traumatic injury to the chest, abdomen or pelvis. 2. No evidence of pulmonary embolism. 3. Mild diffuse ground-glass attenuation throughout the lungs bilaterally, suggestive of mild interstitial pulmonary edema. 4. Nonobstructive calculi in the collecting system of left kidney measuring up to 10 mm in the interpolar region. 5. Right adrenal adenoma, similar to prior studies. Electronically Signed   By: Vinnie Langton M.D.   On: 02/26/2018 17:42   Dg Pelvis Portable  Result Date: 02/26/2018 CLINICAL DATA:  62 year old who fell approximately 12-15 feet from a ladder earlier today. EXAM: PORTABLE PELVIS 1-2 VIEWS COMPARISON:  Bone window images from CT abdomen and pelvis 04/09/2012. FINDINGS: No evidence of acute fracture. Sacroiliac joints and symphysis pubis intact without diastasis. Joint spaces in both hips well preserved. Bone mineral density well-preserved. IMPRESSION: No osseous abnormality. Electronically Signed   By: Evangeline Dakin M.D.   On: 02/26/2018 15:43   Nm Myocar Multi W/spect W/wall Motion / Ef  Result Date: 02/27/2018 CLINICAL DATA:  62 year old who sustained trauma when he fell off of a ladder approximately 12-15 feet yesterday. The patient had bradycardia followed by asystole while in route to the hospital yesterday. Serial troponins were negative. EXAM: MYOCARDIAL IMAGING WITH SPECT (REST AND PHARMACOLOGIC-STRESS) GATED LEFT VENTRICULAR WALL MOTION STUDY LEFT  VENTRICULAR EJECTION FRACTION TECHNIQUE: Standard myocardial SPECT imaging was performed after resting intravenous injection of 10 mCi Tc-76m tetrofosmin. Subsequently, intravenous infusion of Lexiscan was performed under the supervision of the Cardiology staff. At peak effect of the drug, 30 mCi Tc-14m tetrofosmin was injected intravenously and standard myocardial SPECT imaging was performed. Quantitative gated imaging was also performed to evaluate left ventricular wall motion, and estimate left ventricular ejection fraction. COMPARISON:  None. FINDINGS: Perfusion: Slight diminished activity in a small focal segment of the distal INFERIOR wall relative to the remainder of the LEFT ventricular myocardium on both the resting and post Lexiscan images. No evidence of reversibility to suggest myocardial ischemia. Wall Motion: Mild INFERIOR wall hypokinesis. LEFT ventricular wall motion otherwise normal. No evidence of LEFT ventricular dilation. Left Ventricular Ejection Fraction: 53% End diastolic volume 99 ml End systolic volume 43 ml IMPRESSION: 1. No evidence of myocardial ischemia. Possible infarct involving a short segment of the distal INFERIOR wall. 2. Mild INFERIOR wall hypokinesis. Normal left ventricular wall motion otherwise. 3. Left ventricular ejection fraction 56% 4. Non invasive risk stratification*: Low. *2012 Appropriate Use Criteria for Coronary Revascularization Focused Update: J Am Coll Cardiol. 6644;03(4):742-595. http://content.airportbarriers.com.aspx?articleid=1201161. Electronically Signed   By: Evangeline Dakin M.D.   On: 02/27/2018 12:51   Dg Chest Port 1 View  Result Date: 02/26/2018 CLINICAL DATA:  62 year old who fell approximately 12-15 feet from a ladder earlier today. Chest pain which the patient states is related to chest compressions. Initial encounter. EXAM: PORTABLE CHEST 1 VIEW COMPARISON:  10/03/2015 and earlier. FINDINGS: Suboptimal inspiration. Cardiac silhouette upper  normal in size to slightly enlarged for technique and degree of inspiration. Lungs clear. Bronchovascular markings normal. Pulmonary vascularity normal. No visible pleural effusions. No pneumothorax. IMPRESSION:  Suboptimal inspiration. Borderline heart size. No acute cardiopulmonary disease. Electronically Signed   By: Evangeline Dakin M.D.   On: 02/26/2018 15:41   Dg Femur Min 2 Views Left  Result Date: 02/26/2018 CLINICAL DATA:  Status post fall 15 feet today EXAM: LEFT FEMUR 2 VIEWS COMPARISON:  None. FINDINGS: There is no evidence of fracture or dislocation. Soft tissues are unremarkable. IMPRESSION: No acute fracture or dislocation. Electronically Signed   By: Abelardo Diesel M.D.   On: 02/26/2018 19:58    Procedures Ultrasound ED FAST Date/Time: 02/26/2018 3:34 PM Performed by: Hayden Rasmussen, MD Authorized by: Hayden Rasmussen, MD  Procedure details:    Indications: blunt abdominal trauma      Assess for:  Intra-abdominal fluid and pericardial effusion    Technique:  Abdominal and cardiac    Images: archived    Study Limitations: body habitus  Abdominal findings:    L kidney:  Visualized   R kidney:  Visualized   Liver:  Visualized   Bladder:  Visualized,    Hepatorenal space visualized: identified     Splenorenal space: identified     Rectovesical free fluid: identified     Splenorenal free fluid: not identified     Hepatorenal space free fluid: not identified   Cardiac findings:    Heart:  Visualized   Wall motion: identified     Pericardial effusion: not identified     (including critical care time)  Medications Ordered in ED Medications  heparin injection 5,000 Units (5,000 Units Subcutaneous Given 02/27/18 0513)  sodium chloride flush (NS) 0.9 % injection 3 mL (3 mLs Intravenous Given 02/26/18 2207)  sodium chloride flush (NS) 0.9 % injection 3 mL (has no administration in time range)  0.9 %  sodium chloride infusion (has no administration in time range)    acetaminophen (TYLENOL) tablet 650 mg (650 mg Oral Given 02/27/18 0137)    Or  acetaminophen (TYLENOL) suppository 650 mg ( Rectal See Alternative 02/27/18 0137)  regadenoson (LEXISCAN) 0.4 MG/5ML injection SOLN (has no administration in time range)  sodium chloride 0.9 % bolus 953 mL (0 mL/kg  95.3 kg Intravenous Stopped 02/26/18 1616)  iopamidol (ISOVUE-370) 76 % injection (100 mLs  Contrast Given 02/26/18 1645)  fentaNYL (SUBLIMAZE) injection 25 mcg (25 mcg Intravenous Given 02/26/18 2202)  technetium tetrofosmin (TC-MYOVIEW) injection 10 millicurie (10 millicuries Intravenous Contrast Given 02/27/18 0740)  regadenoson (LEXISCAN) injection SOLN 0.4 mg (0.4 mg Intravenous Given by Other 02/27/18 1004)  technetium tetrofosmin (TC-MYOVIEW) injection 30 millicurie (30 millicuries Intravenous Contrast Given 02/27/18 1004)     Initial Impression / Assessment and Plan / ED Course  I have reviewed the triage vital signs and the nursing notes.  Pertinent labs & imaging results that were available during my care of the patient were reviewed by me and considered in my medical decision making (see chart for details).  Clinical Course as of Feb 28 1308  Sat Feb 26, 2018  1534 Reviewed the initial chest x-ray and pelvis no obvious abnormalities waiting final reading.  Bedside FAST exam with no obvious free fluids.   [MB]  1610 Patient remains with minimal complaints.  Vitals heart rate in the 80s normal sinus with a blood pressure 110 sats 93% on room air.  I talked with Dr. Durward Fortes trauma attending who agrees with current work-up of labs x-rays and pan scan CT and he asks if we would call back if there is any abnormalities.  Barring any trauma issues he is  recommending a medical admission for further evaluation.   [MB]  2683 Reevaluated patient, he has no complaints other than he is feeling cold.  We got him an extra blanket.  Last BP 120/70.   [MB]  1841 Patient's work-up has been fairly  unremarkable.  He does have some minor elevations in his creatinine.  But his last one was 1.45.  He is still having some pain in the left side of his thigh and his left lower leg above his ankle.  I put him in for some plain films there.  I have also put in a consult to cardiology.   [MB]  4196 Discussed with Dr. Terrence Dupont from cardiology who will see the patient in consult but recommends medicine admission   [MB]    Clinical Course User Index [MB] Hayden Rasmussen, MD     Final Clinical Impressions(s) / ED Diagnoses   Final diagnoses:  Fall, initial encounter  Cardiac arrest [facility])    ED Discharge Orders    None       Hayden Rasmussen, MD 02/27/18 1311

## 2018-02-26 NOTE — ED Notes (Signed)
Patient transported to CT 

## 2018-02-26 NOTE — H&P (Signed)
Referring Physician:   [name] is an 62 y.o. male.                       Chief Complaint: Cardiac arrest  HPI: 62 year old male was cutting a branch of a tree up on a ladder 12-15 feet. The branch fell on ladder and patient fell on ground. On EMS arrival patient was awake and alert but on route he had bradycardia followed by asystole. Patient remember feeling hot and vision going down before passing out. 1 minute of CPR and he had spontaneous return of circulation. He was awake and alert. EKG before and after event shows sinus rhythm but during event recording is missing. Patient has chest pain from CPR.   Past Medical History:  Diagnosis Date  . Allergy    seasonal  . Arthritis    mild  . Cancer ([place])    skin  . Circadian rhythm sleep disorder, shift work type 07/17/2013  . Complication of anesthesia    nausea and vomiting  . GERD (gastroesophageal reflux disease)   . Hypertension   . Insomnia secondary to restless leg syndrome 07/17/2013  . Kidney stones   . OSA on CPAP   . PONV (postoperative nausea and vomiting)   . Sleep apnea    cpap      Past Surgical History:  Procedure Laterality Date  . acl repair left knee    . BACK SURGERY  2017  . CARDIAC CATHETERIZATION  2005   negative   . CHOLECYSTECTOMY    . CYSTOSCOPY/RETROGRADE/URETEROSCOPY/STONE EXTRACTION WITH BASKET  04/07/2012   Procedure: CYSTOSCOPY/RETROGRADE/URETEROSCOPY/STONE EXTRACTION WITH BASKET;  Surgeon: Malka So, MD;  Location: [facility];  Service: Urology;  Laterality: Bilateral;  cystoscopy, bilateral stent removal, bilateral ureteroscopy with bilateral stone extraction and insertion of right ureteral stent  . fusion great toe right foot    . ligaments repair left wrist     . ROTATOR CUFF REPAIR     left shoulder   . skin cancer removed from left cheek    . stone extractions and urethral stents     multiple  . tendons cut on left heel    . TONSILLECTOMY    . VARICOCELE EXCISION      Family  History  Problem Relation Age of Onset  . Cancer Mother        bladder cancer  . Heart disease Mother        CABG  . Cancer Father        lung cancer after asbestosis  . Hypertension Father   . Diabetes Brother   . Colon cancer Neg Hx    Social History:  reports that he has never smoked. He has never used smokeless tobacco. He reports that he does not drink alcohol or use drugs.  Allergies:  Allergies  Allergen Reactions  . Prednisone Other (See Comments)    Severe stomach pain  . Codeine Itching  . Hydrocodone Itching    (Not in a hospital admission)   Results for orders placed or performed during the hospital encounter of 02/26/18 (from the past 48 hour(s))  CDS serology     Status: None   Collection Time: 02/26/18  3:55 PM  Result Value Ref Range   CDS serology specimen      SPECIMEN WILL BE HELD FOR 14 DAYS IF TESTING IS REQUIRED    Comment: SPECIMEN WILL BE HELD FOR 14 DAYS IF TESTING IS  REQUIRED SPECIMEN WILL BE HELD FOR 14 DAYS IF TESTING IS REQUIRED Performed at [facility], [place] [address], [name], [name] [address]   Comprehensive metabolic panel     Status: Abnormal   Collection Time: 02/26/18  3:55 PM  Result Value Ref Range   Sodium 141 135 - 145 mmol/L   Potassium 3.6 3.5 - 5.1 mmol/L   Chloride 111 98 - 111 mmol/L   CO2 21 (L) 22 - 32 mmol/L   Glucose, Bld 114 (H) 70 - 99 mg/dL   BUN 27 (H) 8 - 23 mg/dL   Creatinine, Ser 1.57 (H) 0.61 - 1.24 mg/dL   Calcium 8.7 (L) 8.9 - 10.3 mg/dL   Total Protein 5.9 (L) 6.5 - 8.1 g/dL   Albumin 3.7 3.5 - 5.0 g/dL   AST 27 15 - 41 U/L   ALT 30 0 - 44 U/L   Alkaline Phosphatase 61 38 - 126 U/L   Total Bilirubin 0.8 0.3 - 1.2 mg/dL   GFR calc non Af Amer 46 (L) >60 mL/min   GFR calc Af Amer 54 (L) >60 mL/min   Anion gap 9 5 - 15    Comment: Performed at [facility] [address], [place] [address]  CBC     Status: None   Collection Time: 02/26/18  3:55 PM  Result Value Ref Range   WBC  9.3 4.0 - 10.5 K/uL   RBC 4.42 4.22 - 5.81 MIL/uL   Hemoglobin 13.5 13.0 - 17.0 g/dL   HCT 40.5 39.0 - 52.0 %   MCV 91.6 80.0 - 100.0 fL   MCH 30.5 26.0 - 34.0 pg   MCHC 33.3 30.0 - 36.0 g/dL   RDW 12.8 11.5 - 15.5 %   Platelets 185 150 - 400 K/uL   nRBC 0.0 0.0 - 0.2 %    Comment: Performed at [facility], [name] [address] [name], [name] [address]  Ethanol     Status: None   Collection Time: 02/26/18  3:55 PM  Result Value Ref Range   Alcohol, Ethyl (B) <10 <10 mg/dL    Comment: (NOTE) Lowest detectable limit for serum alcohol is 10 mg/dL. For medical purposes only. Performed at [facility], [name] [address], [place], [name] [address]   Protime-INR     Status: None   Collection Time: 02/26/18  3:55 PM  Result Value Ref Range   Prothrombin Time 14.0 11.4 - 15.2 seconds   INR 1.09     Comment: Performed at [facility] [address] [place], [place] [address]  Sample to Blood Bank     Status: None   Collection Time: 02/26/18  3:55 PM  Result Value Ref Range   Blood Bank Specimen SAMPLE AVAILABLE FOR TESTING    Sample Expiration      02/27/2018 Performed at [facility], [place] [address], [name], [name] [address]   I-Stat Troponin, ED     Status: None   Collection Time: 02/26/18  4:08 PM  Result Value Ref Range   Troponin i, poc 0.00 0.00 - 0.08 ng/mL   Comment 3            Comment: Due to the release kinetics of cTnI, a negative result within the first hours of the onset of symptoms does not rule out myocardial infarction with certainty. If myocardial infarction is still suspected, repeat the test at appropriate intervals.   I-Stat Chem  8, ED     Status: Abnormal   Collection Time: 02/26/18  4:10 PM  Result Value Ref Range   Sodium 142 135 - 145 mmol/L   Potassium 3.6 3.5 - 5.1 mmol/L   Chloride 108 98 - 111 mmol/L   BUN 29 (H) 8 - 23 mg/dL   Creatinine, Ser 1.50 (H) 0.61 - 1.24 mg/dL   Glucose, Bld 106 (H) 70 - 99 mg/dL   Calcium,  Ion 1.16 1.15 - 1.40 mmol/L   TCO2 23 22 - 32 mmol/L   Hemoglobin 13.3 13.0 - 17.0 g/dL   HCT 39.0 39.0 - 52.0 %  I-Stat CG4 Lactic Acid, ED     Status: None   Collection Time: 02/26/18  4:10 PM  Result Value Ref Range   Lactic Acid, Venous 1.09 0.5 - 1.9 mmol/L  Urinalysis, Routine w reflex microscopic     Status: Abnormal   Collection Time: 02/26/18  5:12 PM  Result Value Ref Range   Color, Urine YELLOW YELLOW   APPearance HAZY (A) CLEAR   Specific Gravity, Urine 1.021 1.005 - 1.030   pH 5.0 5.0 - 8.0   Glucose, UA NEGATIVE NEGATIVE mg/dL   Hgb urine dipstick MODERATE (A) NEGATIVE   Bilirubin Urine NEGATIVE NEGATIVE   Ketones, ur 20 (A) NEGATIVE mg/dL   Protein, ur NEGATIVE NEGATIVE mg/dL   Nitrite NEGATIVE NEGATIVE   Leukocytes, UA NEGATIVE NEGATIVE   RBC / HPF 11-20 0 - 5 RBC/hpf   WBC, UA 0-5 0 - 5 WBC/hpf   Bacteria, UA NONE SEEN NONE SEEN   Mucus PRESENT    Hyaline Casts, UA PRESENT     Comment: Performed at [facility], [address] [place], [place] [address]   Dg Tibia/fibula Left  Result Date: 02/26/2018 CLINICAL DATA:  Status post fall fifteen feet the today. EXAM: LEFT TIBIA AND FIBULA - 2 VIEW COMPARISON:  None. FINDINGS: There is no evidence of fracture or dislocation. Soft tissues are unremarkable. IMPRESSION: No acute fracture or dislocation. Electronically Signed   By: Abelardo Diesel M.D.   On: 02/26/2018 19:56   Ct Head Wo Contrast  Result Date: 02/26/2018 CLINICAL DATA:  Cutting a tree limb, the limb hit the ladder pt was standing on causing him to fall to the ground. Fell approx 12-46ft. Positive LOC. While en-route to the hospital pt went into cardiac arrest. Pt c/o chest pain at this time. EXAM: CT HEAD WITHOUT CONTRAST CT CERVICAL SPINE WITHOUT CONTRAST TECHNIQUE: Multidetector CT imaging of the head and cervical spine was performed following the standard protocol without intravenous contrast. Multiplanar CT image reconstructions of the cervical  spine were also generated. COMPARISON:  10/03/2015 FINDINGS: CT HEAD FINDINGS Brain: No evidence of acute infarction, hemorrhage, hydrocephalus, extra-axial collection or mass lesion/mass effect. Vascular: No hyperdense vessel or unexpected calcification. Skull: Normal. Negative for fracture or focal lesion. Sinuses/Orbits: There is mucosal thickening of the RIGHT axillary and scattered ethmoid air cells. No evidence for sinus wall fracture. Other: None. CT CERVICAL SPINE FINDINGS Alignment: Normal. Skull base and vertebrae: No acute fracture. No primary bone lesion or focal pathologic process. Soft tissues and spinal canal: No prevertebral fluid or swelling. No visible canal hematoma. Disc levels:  Normal Upper chest: Negative. Other: There is a low-attenuation partially calcified mass within the UPPER pole of the RIGHT lobe of the thyroid, measuring 1.7 x 1.3 x 2.0 centimeters. IMPRESSION: 1. No evidence for acute intracranial abnormality. 2. Mild sinus disease. 3. No evidence for  acute cervical spine abnormality. 4. Partially calcified mass within the UPPER pole of the RIGHT lobe of the thyroid. Recommend further evaluation with thyroid ultrasound based on consensus criteria. Electronically Signed   By: Nolon Nations M.D.   On: 02/26/2018 17:33   Ct Angio Chest Pe W/cm &/or Wo Cm  Result Date: 02/26/2018 CLINICAL DATA:  62 year old male with history of trauma from a fall off of a ladder approximately 12-15 feet. Loss of consciousness. Cardiac arrest on the way to the hospital. Current chest pain. EXAM: CT ANGIOGRAPHY CHEST CT ABDOMEN AND PELVIS WITH CONTRAST TECHNIQUE: Multidetector CT imaging of the chest was performed using the standard protocol during bolus administration of intravenous contrast. Multiplanar CT image reconstructions and MIPs were obtained to evaluate the vascular anatomy. Multidetector CT imaging of the abdomen and pelvis was performed using the standard protocol during bolus  administration of intravenous contrast. CONTRAST:  159mL ISOVUE-370 IOPAMIDOL (ISOVUE-370) INJECTION 76% COMPARISON:  CT the abdomen and pelvis 04/09/2012. FINDINGS: CTA CHEST FINDINGS Cardiovascular: No filling defects in the pulmonary arterial tree to suggest underlying pulmonary embolism. Heart size is normal. There is no significant pericardial fluid, thickening or pericardial calcification. No acute abnormality of the thoracic aorta or great vessels of the mediastinum. Specifically, no evidence of posttraumatic aortic transection or dissection. Mediastinum/Nodes: No high attenuation fluid collection within the mediastinum to suggest posttraumatic mediastinal hematoma. No pathologically enlarged mediastinal or hilar lymph nodes. Esophagus is unremarkable in appearance. No axillary lymphadenopathy. Lungs/Pleura: Mild diffuse ground-glass attenuation throughout the lungs bilaterally. No confluent consolidative airspace disease. No pleural effusions. No pneumothorax. No suspicious appearing pulmonary nodules or masses. Musculoskeletal: No acute displaced fractures or aggressive appearing lytic or blastic lesions are noted in the visualized portions of the skeleton. Review of the MIP images confirms the above findings. CT ABDOMEN and PELVIS FINDINGS Hepatobiliary: No signs of acute traumatic injury to the liver. No suspicious cystic or solid hepatic lesions. No intra or extrahepatic biliary ductal dilatation. Status post cholecystectomy. Pancreas: No evidence of acute traumatic injury to the pancreas. No pancreatic mass. No pancreatic ductal dilatation. No pancreatic or peripancreatic fluid or inflammatory changes. Spleen: No evidence of acute traumatic injury to the spleen. Adrenals/Urinary Tract: No evidence of acute traumatic injury to either kidney or adrenal gland. Several nonobstructive calculi are noted within the left renal collecting system measuring up to 10 mm in the interpolar region. Low-attenuation  lesions in the right kidney, compatible with simple cysts measuring up to 2.7 cm in the interpolar region. No hydroureteronephrosis. Urinary bladder is normal in appearance. Left adrenal gland is normal in appearance. 2 cm right adrenal nodule in the lateral limb, stable compared to the prior study from 2014, previously characterized as an adenoma. Stomach/Bowel: No signs of acute traumatic injury to the hollow viscera. Normal appearance of the stomach. No pathologic dilatation of small bowel or colon. Normal appendix. Vascular/Lymphatic: No evidence of acute traumatic injury to the abdominal aorta or pelvic vasculature. No significant atherosclerotic disease, aneurysm or dissection noted in the abdominal or pelvic vasculature. No lymphadenopathy noted in the abdomen or pelvis. Reproductive: Prostate gland and seminal vesicles are unremarkable in appearance. Other: No high attenuation fluid collection in the peritoneal cavity or retroperitoneum to suggest posttraumatic hemorrhage. No significant volume of ascites. No pneumoperitoneum. Musculoskeletal: No acute displaced fractures or aggressive appearing lytic or blastic lesions are noted in the visualized portions of the skeleton. Review of the MIP images confirms the above findings. IMPRESSION: 1. No evidence of significant acute traumatic  injury to the chest, abdomen or pelvis. 2. No evidence of pulmonary embolism. 3. Mild diffuse ground-glass attenuation throughout the lungs bilaterally, suggestive of mild interstitial pulmonary edema. 4. Nonobstructive calculi in the collecting system of left kidney measuring up to 10 mm in the interpolar region. 5. Right adrenal adenoma, similar to prior studies. Electronically Signed   By: Vinnie Langton M.D.   On: 02/26/2018 17:42   Ct Cervical Spine Wo Contrast  Result Date: 02/26/2018 CLINICAL DATA:  Cutting a tree limb, the limb hit the ladder pt was standing on causing him to fall to the ground. Fell approx  12-81ft. Positive LOC. While en-route to the hospital pt went into cardiac arrest. Pt c/o chest pain at this time. EXAM: CT HEAD WITHOUT CONTRAST CT CERVICAL SPINE WITHOUT CONTRAST TECHNIQUE: Multidetector CT imaging of the head and cervical spine was performed following the standard protocol without intravenous contrast. Multiplanar CT image reconstructions of the cervical spine were also generated. COMPARISON:  10/03/2015 FINDINGS: CT HEAD FINDINGS Brain: No evidence of acute infarction, hemorrhage, hydrocephalus, extra-axial collection or mass lesion/mass effect. Vascular: No hyperdense vessel or unexpected calcification. Skull: Normal. Negative for fracture or focal lesion. Sinuses/Orbits: There is mucosal thickening of the RIGHT axillary and scattered ethmoid air cells. No evidence for sinus wall fracture. Other: None. CT CERVICAL SPINE FINDINGS Alignment: Normal. Skull base and vertebrae: No acute fracture. No primary bone lesion or focal pathologic process. Soft tissues and spinal canal: No prevertebral fluid or swelling. No visible canal hematoma. Disc levels:  Normal Upper chest: Negative. Other: There is a low-attenuation partially calcified mass within the UPPER pole of the RIGHT lobe of the thyroid, measuring 1.7 x 1.3 x 2.0 centimeters. IMPRESSION: 1. No evidence for acute intracranial abnormality. 2. Mild sinus disease. 3. No evidence for acute cervical spine abnormality. 4. Partially calcified mass within the UPPER pole of the RIGHT lobe of the thyroid. Recommend further evaluation with thyroid ultrasound based on consensus criteria. Electronically Signed   By: Nolon Nations M.D.   On: 02/26/2018 17:33   Ct Abdomen Pelvis W Contrast  Result Date: 02/26/2018 CLINICAL DATA:  62 year old male with history of trauma from a fall off of a ladder approximately 12-15 feet. Loss of consciousness. Cardiac arrest on the way to the hospital. Current chest pain. EXAM: CT ANGIOGRAPHY CHEST CT ABDOMEN AND  PELVIS WITH CONTRAST TECHNIQUE: Multidetector CT imaging of the chest was performed using the standard protocol during bolus administration of intravenous contrast. Multiplanar CT image reconstructions and MIPs were obtained to evaluate the vascular anatomy. Multidetector CT imaging of the abdomen and pelvis was performed using the standard protocol during bolus administration of intravenous contrast. CONTRAST:  179mL ISOVUE-370 IOPAMIDOL (ISOVUE-370) INJECTION 76% COMPARISON:  CT the abdomen and pelvis 04/09/2012. FINDINGS: CTA CHEST FINDINGS Cardiovascular: No filling defects in the pulmonary arterial tree to suggest underlying pulmonary embolism. Heart size is normal. There is no significant pericardial fluid, thickening or pericardial calcification. No acute abnormality of the thoracic aorta or great vessels of the mediastinum. Specifically, no evidence of posttraumatic aortic transection or dissection. Mediastinum/Nodes: No high attenuation fluid collection within the mediastinum to suggest posttraumatic mediastinal hematoma. No pathologically enlarged mediastinal or hilar lymph nodes. Esophagus is unremarkable in appearance. No axillary lymphadenopathy. Lungs/Pleura: Mild diffuse ground-glass attenuation throughout the lungs bilaterally. No confluent consolidative airspace disease. No pleural effusions. No pneumothorax. No suspicious appearing pulmonary nodules or masses. Musculoskeletal: No acute displaced fractures or aggressive appearing lytic or blastic lesions are noted in the  visualized portions of the skeleton. Review of the MIP images confirms the above findings. CT ABDOMEN and PELVIS FINDINGS Hepatobiliary: No signs of acute traumatic injury to the liver. No suspicious cystic or solid hepatic lesions. No intra or extrahepatic biliary ductal dilatation. Status post cholecystectomy. Pancreas: No evidence of acute traumatic injury to the pancreas. No pancreatic mass. No pancreatic ductal dilatation. No  pancreatic or peripancreatic fluid or inflammatory changes. Spleen: No evidence of acute traumatic injury to the spleen. Adrenals/Urinary Tract: No evidence of acute traumatic injury to either kidney or adrenal gland. Several nonobstructive calculi are noted within the left renal collecting system measuring up to 10 mm in the interpolar region. Low-attenuation lesions in the right kidney, compatible with simple cysts measuring up to 2.7 cm in the interpolar region. No hydroureteronephrosis. Urinary bladder is normal in appearance. Left adrenal gland is normal in appearance. 2 cm right adrenal nodule in the lateral limb, stable compared to the prior study from 2014, previously characterized as an adenoma. Stomach/Bowel: No signs of acute traumatic injury to the hollow viscera. Normal appearance of the stomach. No pathologic dilatation of small bowel or colon. Normal appendix. Vascular/Lymphatic: No evidence of acute traumatic injury to the abdominal aorta or pelvic vasculature. No significant atherosclerotic disease, aneurysm or dissection noted in the abdominal or pelvic vasculature. No lymphadenopathy noted in the abdomen or pelvis. Reproductive: Prostate gland and seminal vesicles are unremarkable in appearance. Other: No high attenuation fluid collection in the peritoneal cavity or retroperitoneum to suggest posttraumatic hemorrhage. No significant volume of ascites. No pneumoperitoneum. Musculoskeletal: No acute displaced fractures or aggressive appearing lytic or blastic lesions are noted in the visualized portions of the skeleton. Review of the MIP images confirms the above findings. IMPRESSION: 1. No evidence of significant acute traumatic injury to the chest, abdomen or pelvis. 2. No evidence of pulmonary embolism. 3. Mild diffuse ground-glass attenuation throughout the lungs bilaterally, suggestive of mild interstitial pulmonary edema. 4. Nonobstructive calculi in the collecting system of left kidney  measuring up to 10 mm in the interpolar region. 5. Right adrenal adenoma, similar to prior studies. Electronically Signed   By: Vinnie Langton M.D.   On: 02/26/2018 17:42   Dg Pelvis Portable  Result Date: 02/26/2018 CLINICAL DATA:  62 year old who fell approximately 12-15 feet from a ladder earlier today. EXAM: PORTABLE PELVIS 1-2 VIEWS COMPARISON:  Bone window images from CT abdomen and pelvis 04/09/2012. FINDINGS: No evidence of acute fracture. Sacroiliac joints and symphysis pubis intact without diastasis. Joint spaces in both hips well preserved. Bone mineral density well-preserved. IMPRESSION: No osseous abnormality. Electronically Signed   By: Evangeline Dakin M.D.   On: 02/26/2018 15:43   Dg Chest Port 1 View  Result Date: 02/26/2018 CLINICAL DATA:  62 year old who fell approximately 12-15 feet from a ladder earlier today. Chest pain which the patient states is related to chest compressions. Initial encounter. EXAM: PORTABLE CHEST 1 VIEW COMPARISON:  10/03/2015 and earlier. FINDINGS: Suboptimal inspiration. Cardiac silhouette upper normal in size to slightly enlarged for technique and degree of inspiration. Lungs clear. Bronchovascular markings normal. Pulmonary vascularity normal. No visible pleural effusions. No pneumothorax. IMPRESSION: Suboptimal inspiration. Borderline heart size. No acute cardiopulmonary disease. Electronically Signed   By: Evangeline Dakin M.D.   On: 02/26/2018 15:41    Review Of Systems Constitutional: No fever, chills, weight loss or gain. Eyes: No vision change, wears glasses. No discharge or pain. Ears: No hearing loss, No tinnitus. Respiratory: No asthma, COPD, pneumonias. No shortness of breath.  No hemoptysis. Cardiovascular: No chest pain, palpitation, leg edema. Gastrointestinal: No nausea, vomiting, diarrhea, constipation. No GI bleed. No hepatitis. Genitourinary: No dysuria, hematuria, positive kidney stone. No incontinance. Neurological: No headache,  stroke, seizures.  Psychiatry: No psych facility admission for anxiety, depression, suicide. No detox. Skin: No rash. Musculoskeletal: Positive joint pain, no fibromyalgia. No neck pain, back pain. Lymphadenopathy: No lymphadenopathy. Hematology: No anemia or easy bruising.   Blood pressure 125/76, pulse 67, temperature 98.7 F (37.1 C), temperature source Oral, resp. rate 19, weight 95.3 kg, SpO2 97 %. Body mass index is 34.95 kg/m. General appearance: alert, cooperative, appears stated age and no distress Head: Normocephalic, atraumatic. Eyes: Blue eyes, pink conjunctiva, corneas clear. PERRL, EOM's intact. Neck: No adenopathy, no carotid bruit, no JVD, supple, symmetrical, trachea midline and thyroid not enlarged. Resp: Clear to auscultation bilaterally. Cardio: Regular rate and rhythm, S1, S2 normal, II/VI systolic murmur, no click, rub or gallop GI: Soft, non-tender; bowel sounds normal; no organomegaly. Extremities: No edema, cyanosis or clubbing. Left leg bruize from fall. Skin: Warm and dry.  Neurologic: Alert and oriented X 3, normal strength. Normal coordination.  Assessment/Plan Cardiac arrest survivor Hypertension Musculoskeleatl chest pain H/O kidney stone OSA  Place in observation. R/O MI. Echocardiogram. NM myocardial perfusion stress test.  Birdie Riddle, MD  02/26/2018, 8:00 PM

## 2018-02-26 NOTE — ED Notes (Signed)
Secretary informed RN that he received a message that pt could eat.  RN will provide w/ sandwich.

## 2018-02-26 NOTE — ED Notes (Signed)
Patient transported to X-ray 

## 2018-02-26 NOTE — ED Notes (Signed)
Paged trauma Md Per MD Melina Copa

## 2018-02-26 NOTE — ED Triage Notes (Signed)
Pt BIB [facility] for eval of fall 12-15 and subsequent witnessed cardiac arrest. EMS reports pt was cutting a tree branch, tree limb fell and hit ladder and caused pt to fall 12-15 ft. Pt was CAOx4 on EMS arrival, wife convinced pt to come to ED. En route to ED, EMS reports a witnessed cardiac arrest- pt brady'd down to 30s and then asystole. EMS could not obtain carotid/femoral pulses. Initiated CPR w/ ROSC in 1 minute. No ACLS drugs given by EMS. Pt arrives GCS 15 A&Ox4, denies any pain at this time.

## 2018-02-27 ENCOUNTER — Observation Stay (HOSPITAL_COMMUNITY): Payer: BC Managed Care – PPO

## 2018-02-27 ENCOUNTER — Observation Stay (HOSPITAL_BASED_OUTPATIENT_CLINIC_OR_DEPARTMENT_OTHER): Payer: BC Managed Care – PPO

## 2018-02-27 DIAGNOSIS — R55 Syncope and collapse: Secondary | ICD-10-CM | POA: Diagnosis not present

## 2018-02-27 LAB — CBC
HEMATOCRIT: 38.5 % — AB (ref 39.0–52.0)
Hemoglobin: 13.1 g/dL (ref 13.0–17.0)
MCH: 31.4 pg (ref 26.0–34.0)
MCHC: 34 g/dL (ref 30.0–36.0)
MCV: 92.3 fL (ref 80.0–100.0)
Platelets: 212 10*3/uL (ref 150–400)
RBC: 4.17 MIL/uL — ABNORMAL LOW (ref 4.22–5.81)
RDW: 13 % (ref 11.5–15.5)
WBC: 8.5 10*3/uL (ref 4.0–10.5)
nRBC: 0 % (ref 0.0–0.2)

## 2018-02-27 LAB — BASIC METABOLIC PANEL
Anion gap: 10 (ref 5–15)
BUN: 23 mg/dL (ref 8–23)
CO2: 23 mmol/L (ref 22–32)
Calcium: 8.7 mg/dL — ABNORMAL LOW (ref 8.9–10.3)
Chloride: 111 mmol/L (ref 98–111)
Creatinine, Ser: 1.37 mg/dL — ABNORMAL HIGH (ref 0.61–1.24)
GFR calc Af Amer: >60 mL/min (ref >60)
GFR calc non Af Amer: 55 mL/min — ABNORMAL LOW (ref >60)
Glucose, Bld: 86 mg/dL (ref 70–99)
Potassium: 4 mmol/L (ref 3.5–5.1)
Sodium: 144 mmol/L (ref 135–145)

## 2018-02-27 LAB — ECHOCARDIOGRAM COMPLETE
HEIGHTINCHES: 65 in
Weight: 3172.8 oz

## 2018-02-27 LAB — TROPONIN I
Troponin I: <0.03 ng/mL (ref <0.03)
Troponin I: <0.03 ng/mL (ref <0.03)

## 2018-02-27 LAB — LIPID PANEL
Cholesterol: 117 mg/dL (ref 0–200)
HDL: 28 mg/dL — AB (ref >40)
LDL Cholesterol: 79 mg/dL (ref 0–99)
Total CHOL/HDL Ratio: 4.2 RATIO
Triglycerides: 50 mg/dL (ref <150)
VLDL: 10 mg/dL (ref 0–40)

## 2018-02-27 MED ORDER — LISINOPRIL 20 MG PO TABS
20.0000 mg | ORAL_TABLET | Freq: Every day | ORAL | Status: DC
  Administered 2018-02-27: 20 mg via ORAL
  Filled 2018-02-27: qty 1

## 2018-02-27 MED ORDER — REGADENOSON 0.4 MG/5ML IV SOLN
INTRAVENOUS | Status: AC
  Filled 2018-02-27: qty 5

## 2018-02-27 MED ORDER — NAPROXEN 375 MG PO TABS
375.0000 mg | ORAL_TABLET | Freq: Two times a day (BID) | ORAL | Status: DC
  Administered 2018-02-27 – 2018-02-28 (×2): 375 mg via ORAL
  Filled 2018-02-27 (×2): qty 1

## 2018-02-27 MED ORDER — LISINOPRIL-HYDROCHLOROTHIAZIDE 20-12.5 MG PO TABS: 1.0000 | ORAL_TABLET | Freq: Every day | ORAL | Status: DC

## 2018-02-27 MED ORDER — TECHNETIUM TC 99M TETROFOSMIN IV KIT
30.0000 | PACK | Freq: Once | INTRAVENOUS | Status: AC | PRN
  Administered 2018-02-27: 30 via INTRAVENOUS

## 2018-02-27 MED ORDER — TRAZODONE HCL 100 MG PO TABS
200.0000 mg | ORAL_TABLET | Freq: Every day | ORAL | Status: DC
  Administered 2018-02-27: 200 mg via ORAL
  Filled 2018-02-27: qty 2

## 2018-02-27 MED ORDER — TECHNETIUM TC 99M TETROFOSMIN IV KIT
10.0000 | PACK | Freq: Once | INTRAVENOUS | Status: AC | PRN
  Administered 2018-02-27: 10 via INTRAVENOUS

## 2018-02-27 MED ORDER — TRAMADOL HCL 50 MG PO TABS
50.0000 mg | ORAL_TABLET | ORAL | Status: DC
  Administered 2018-02-27: 50 mg via ORAL
  Filled 2018-02-27: qty 1

## 2018-02-27 MED ORDER — HYDROCHLOROTHIAZIDE 12.5 MG PO CAPS
12.5000 mg | ORAL_CAPSULE | Freq: Every day | ORAL | Status: DC
  Administered 2018-02-27: 12.5 mg via ORAL
  Filled 2018-02-27: qty 1

## 2018-02-27 MED ORDER — CLONAZEPAM 0.5 MG PO TABS
3.0000 mg | ORAL_TABLET | Freq: Every day | ORAL | Status: DC
  Administered 2018-02-27: 3 mg via ORAL
  Filled 2018-02-27: qty 6

## 2018-02-27 MED ORDER — REGADENOSON 0.4 MG/5ML IV SOLN
0.4000 mg | Freq: Once | INTRAVENOUS | Status: AC
  Administered 2018-02-27: 0.4 mg via INTRAVENOUS

## 2018-02-27 MED ORDER — PANTOPRAZOLE SODIUM 40 MG PO TBEC
40.0000 mg | DELAYED_RELEASE_TABLET | Freq: Every day | ORAL | Status: DC
  Administered 2018-02-28: 40 mg via ORAL
  Filled 2018-02-27: qty 1

## 2018-02-27 MED ORDER — TRAMADOL HCL 50 MG PO TABS
50.0000 mg | ORAL_TABLET | Freq: Two times a day (BID) | ORAL | Status: DC | PRN
  Administered 2018-02-28: 50 mg via ORAL
  Filled 2018-02-27: qty 1

## 2018-02-27 NOTE — Progress Notes (Signed)
Echocardiogram Complete  Abdulahi Schor, RDCS 

## 2018-02-27 NOTE — ED Notes (Signed)
Paged cardiology for pain medication.

## 2018-02-27 NOTE — ED Notes (Signed)
Pt transported to nuclear med.  

## 2018-02-28 ENCOUNTER — Other Ambulatory Visit: Payer: Self-pay

## 2018-02-28 ENCOUNTER — Other Ambulatory Visit: Payer: Self-pay | Admitting: Family Medicine

## 2018-02-28 ENCOUNTER — Encounter (HOSPITAL_COMMUNITY): Payer: Self-pay

## 2018-02-28 LAB — HIV ANTIBODY (ROUTINE TESTING W REFLEX): HIV Screen 4th Generation wRfx: NONREACTIVE

## 2018-02-28 NOTE — Discharge Summary (Signed)
Physician Discharge Summary  Patient ID: Casey PERZ Sr. MRN: [id number] DOB/AGE: [birth year] 63 y.o.  Admit date: 02/26/2018 Discharge date: 02/28/2018  Admission Diagnoses: Cardiac arrest, survivor Hypertension Musculoskeletal Chest pain H/O kidney stone OSA  Discharge Diagnoses:  Principle problem: Cardiac arrest, survivor Active Problems:   Fall from ladder   Syncope   CAD   Left hip and leg pain and bruise    Hypertension   Musculoskeletal chest pain   H/O kidney stone   OSA   Primary insomnia   CKD, II  Discharged Condition: good  Hospital Course: 62 year old male fell from ladder as branch of tree he was cutting fell on the ladder. He was awake on EMS arrival but en route he felt hot, vision going down, had severe bradycardia and then had asystole. One minute of CPR resulted in spontaneous return of circulation with normal BP and EKG pre and post event.  Troponin I was negative x 3. His nuclear stress test was suggestive of prior small inferior wall MI but echocardiogram was unremarkable.  His x-rays of left Tib/Fib and left femur were unremarkable. He responded well to analgesics for his left hip and leg pain and bruise. He preferred home care v/s rehab at [facility]. He was discharged home in stable condition with follow up by primary care in 1 week and by me in 1 month.  Consults: cardiology  Significant Diagnostic Studies: labs: Near normal CBC, electrolytes and troponin I x 3. Creatinine was 1.57 mg.  Lipid panel was normal except low HDL of 28 mg. LDL was down to 79 mg.  Treatments: analgesia: Aleve bid and Tramadol as needed.  Discharge Exam: Blood pressure 111/78, pulse 65, temperature 97.9 F (36.6 C), temperature source Oral, resp. rate 18, height 5\' 5"  (1.651 m), weight 90.1 kg, SpO2 95 %. General appearance: alert, cooperative and appears stated age. Head: Normocephalic, atraumatic. Eyes: Blue eyes, pink conjunctiva, corneas clear. PERRL, EOM's intact.   Neck: No adenopathy, no carotid bruit, no JVD, supple, symmetrical, trachea midline and thyroid not enlarged. Resp: Clear to auscultation bilaterally. Cardio: Regular rate and rhythm, S1, S2 normal, II/VI systolic murmur, no click, rub or gallop. GI: Soft, non-tender; bowel sounds normal; no organomegaly. Extremities: No edema, cyanosis or clubbing. Large bruise on lateral aspect of mid thigh of left leg. Tenderness of left hip and left leg. Skin: Warm and dry.  Neurologic: Alert and oriented X 3, normal strength and tone. Normal coordination.  Disposition: Discharge disposition: 01-Home or Self Care        Allergies as of 02/28/2018      Reactions   Prednisone Other (See Comments)   Severe stomach pain   Codeine Itching   Hydrocodone Itching      Medication List    STOP taking these medications   SUPER B COMPLEX/C PO   VITAMIN K PO     TAKE these medications   cetirizine 10 MG tablet Commonly known as:  ZYRTEC Take 1 tablet (10 mg total) by mouth daily.   clonazePAM 2 MG tablet Commonly known as:  KLONOPIN Take 1.5 tablets (3 mg total) by mouth at bedtime.   lisinopril-hydrochlorothiazide 20-12.5 MG tablet Commonly known as:  PRINZIDE,ZESTORETIC Take 1 tablet by mouth  every evening What changed:    how much to take  how to take this  when to take this  additional instructions   naproxen sodium 220 MG tablet Commonly known as:  ALEVE Take 2 tablets (440 mg total) by  mouth 2 (two) times daily with a meal. What changed:  when to take this   OVER THE COUNTER MEDICATION Take 2 capsules by mouth at bedtime. Physician's Choice Sleep Aid - Vitamin B6 2 mg, Chamomile flowers 100 mg, valerian root 100 mg, Theanine 50 mg, melatonin 5 mg   pantoprazole 40 MG tablet Commonly known as:  PROTONIX TAKE 1 TABLET (40 MG TOTAL) BY MOUTH DAILY. What changed:    how much to take  how to take this  when to take this  additional instructions   PROBIOTIC DAILY  PO Take 1 capsule by mouth daily. PB8 Probiotic   traMADol 50 MG tablet Commonly known as:  ULTRAM TAKE 1 TABLET (50 MG TOTAL) BY MOUTH 2 (TWO) TIMES DAILY AS NEEDED What changed:    how much to take  how to take this  when to take this  reasons to take this  additional instructions   traZODone 100 MG tablet Commonly known as:  DESYREL Take 2 tablets by mouth at  bedtime What changed:    how much to take  how to take this  when to take this  additional instructions   VITAMIN-B COMPLEX PO Take 1 tablet by mouth daily.      Follow-up Information    Forrest Moron, MD. Schedule an appointment as soon as possible for a visit in 1 week(s).   Specialty:  Internal Medicine Contact information: [place] [address] [phone]        Dixie Dials, MD. Schedule an appointment as soon as possible for a visit in 1 month(s).   Specialty:  Cardiology Contact information: [place] [address] [phone]           Signed: Birdie Riddle 02/28/2018, 9:07 AM

## 2018-02-28 NOTE — Progress Notes (Signed)
Ref: Forrest Moron, MD   Subjective:  Left hip pain and bruise. Home medications needs to be renewed.  Afebrile.  No reversible ischemia on nuclear stress test.  Good LV systolic function on echocardiogram.  Objective:  Vital Signs in the last 24 hours: Temp:  [97.9 F (36.6 C)-98.8 F (37.1 C)] 97.9 F (36.6 C) (12/30 0609) Pulse Rate:  [65-81] 65 (12/30 0609) Cardiac Rhythm: Normal sinus rhythm (12/30 0904) Resp:  [18-20] 18 (12/30 0609) BP: (111-136)/(54-97) 111/78 (12/30 0609) SpO2:  [95 %-97 %] 95 % (12/30 0609) Weight:  [89.9 kg-90.1 kg] 90.1 kg (12/30 0609)  Physical Exam: BP Readings from Last 1 Encounters:  02/28/18 111/78     Wt Readings from Last 1 Encounters:  02/28/18 90.1 kg    Weight change: -5.307 kg Body mass index is 33.05 kg/m. HEENT: [place]/AT, Eyes-Blue, PERL, EOMI, Conjunctiva-Pink, Sclera-Non-icteric Neck: No JVD, No bruit, Trachea midline. Lungs:  Clear, Bilateral. Chest wall tender on palpation. Cardiac:  Regular rhythm, normal S1 and S2, no S3. II/VI systolic murmur. Abdomen:  Soft, non-tender. BS present. Extremities:  No edema present. No cyanosis. No clubbing. 9 inch area of bruise over lateral aspect of left mid thigh. CNS: AxOx3, Cranial nerves grossly intact, moves all 4 extremities.  Skin: Warm and dry.   Intake/Output from previous day: 12/29 0701 - 12/30 0700 In: 240 [P.O.:240] Out: 300 [Urine:300]    Lab Results: BMET    Component Value Date/Time   NA 144 02/27/2018 0301   NA 142 02/26/2018 1610   NA 141 02/26/2018 1555   NA 146 (H) 12/30/2017 1116   NA 143 02/15/2017 0857   K 4.0 02/27/2018 0301   K 3.6 02/26/2018 1610   K 3.6 02/26/2018 1555   CL 111 02/27/2018 0301   CL 108 02/26/2018 1610   CL 111 02/26/2018 1555   CO2 23 02/27/2018 0301   CO2 21 (L) 02/26/2018 1555   CO2 25 12/30/2017 1116   GLUCOSE 86 02/27/2018 0301   GLUCOSE 106 (H) 02/26/2018 1610   GLUCOSE 114 (H) 02/26/2018 1555   BUN 23 02/27/2018 0301    BUN 29 (H) 02/26/2018 1610   BUN 27 (H) 02/26/2018 1555   BUN 29 (H) 12/30/2017 1116   BUN 21 02/15/2017 0857   CREATININE 1.37 (H) 02/27/2018 0301   CREATININE 1.40 (H) 02/26/2018 2007   CREATININE 1.50 (H) 02/26/2018 1610   CREATININE 1.18 10/16/2015 1653   CREATININE 1.08 05/09/2015 1444   CREATININE 0.92 05/01/2014 1448   CALCIUM 8.7 (L) 02/27/2018 0301   CALCIUM 8.7 (L) 02/26/2018 1555   CALCIUM 9.2 12/30/2017 1116   GFRNONAA 55 (L) 02/27/2018 0301   GFRNONAA 53 (L) 02/26/2018 2007   GFRNONAA 46 (L) 02/26/2018 1555   GFRNONAA 67 10/16/2015 1653   GFRNONAA 74 05/09/2015 1444   GFRNONAA 81 03/14/2013 1654   GFRAA >60 02/27/2018 0301   GFRAA >60 02/26/2018 2007   GFRAA 54 (L) 02/26/2018 1555   GFRAA 77 10/16/2015 1653   GFRAA 86 05/09/2015 1444   GFRAA >89 03/14/2013 1654   CBC    Component Value Date/Time   WBC 8.5 02/27/2018 0301   RBC 4.17 (L) 02/27/2018 0301   HGB 13.1 02/27/2018 0301   HCT 38.5 (L) 02/27/2018 0301   PLT 212 02/27/2018 0301   MCV 92.3 02/27/2018 0301   MCV 96.0 12/30/2011 0947   MCH 31.4 02/27/2018 0301   MCHC 34.0 02/27/2018 0301   RDW 13.0 02/27/2018 0301   LYMPHSABS  2.4 05/09/2015 1444   MONOABS 0.6 05/09/2015 1444   EOSABS 0.4 05/09/2015 1444   BASOSABS 0.1 05/09/2015 1444   HEPATIC Function Panel Recent Labs    12/30/17 1116 02/26/18 1555  PROT 6.7 5.9*   HEMOGLOBIN A1C No components found for: HGA1C,  MPG CARDIAC ENZYMES Lab Results  Component Value Date   TROPONINI <0.03 02/27/2018   TROPONINI <0.03 02/26/2018   TROPONINI <0.03 02/26/2018   BNP No results for input(s): PROBNP in the last 8760 hours. TSH No results for input(s): TSH in the last 8760 hours. CHOLESTEROL Recent Labs    12/30/17 1116 02/27/18 0301  CHOL 145 117    Scheduled Meds: . clonazePAM  3 mg Oral QHS  . heparin  5,000 Units Subcutaneous Q8H  . lisinopril  20 mg Oral QHS   And  . hydrochlorothiazide  12.5 mg Oral QHS  . naproxen  375 mg  Oral BID  . pantoprazole  40 mg Oral QAC breakfast  . sodium chloride flush  3 mL Intravenous Q12H  . traZODone  200 mg Oral QHS   Continuous Infusions: . sodium chloride     PRN Meds:.sodium chloride, acetaminophen **OR** acetaminophen, sodium chloride flush, traMADol  Assessment/Plan: Cardiac arrest, survivor Hypertension Musculoskeletal chest pain H/O kidney stone OSA Left hip and leg pain and bruise. Primary insomnia  Give Tramadol for pain control and home medications for sleep.   LOS: 0 days    Dixie Dials  MD  02/28/2018, 9:31 AM

## 2018-03-01 ENCOUNTER — Ambulatory Visit: Payer: Self-pay

## 2018-03-01 NOTE — Telephone Encounter (Signed)
Requested medication (s) are due for refill today: yes  Requested medication (s) are on the active medication list: yes  Last refill:  12/30/17 with 45 tabs  Future visit scheduled: yes  Notes to clinic:  Med can not be delegated, controlled medication. Pt needs urine drug screen.  Requested Prescriptions  Pending Prescriptions Disp Refills   clonazePAM (KLONOPIN) 2 MG tablet [Pharmacy Med Name: CLONAZEPAM 2 MG TABLET] 45 tablet 0    Sig: Take 1.5 tablets (3 mg total) by mouth at bedtime.     Not Delegated - Psychiatry:  Anxiolytics/Hypnotics Failed - 02/28/2018  7:56 PM      Failed - This refill cannot be delegated      Failed - Urine Drug Screen completed in last 360 days.      Passed - Valid encounter within last 6 months    Recent Outpatient Visits          3 weeks ago Cough   Primary Care at [facility], [place], [place]   2 months ago Acute pain of left knee   Primary Care at [place], [place]   6 months ago Shift work sleep disorder   Primary Care at [facility], Arlie Solomons, MD   1 year ago Health maintenance examination   Primary Care at [facility], Arlie Solomons, MD   2 years ago Administrative encounter   Primary Care at [place], [place]      Future Appointments            In 1 week Forrest Moron, MD Primary Care at [place], [facility]   In 1 month Forrest Moron, MD Primary Care at [facility], [facility]

## 2018-03-01 NOTE — Telephone Encounter (Signed)
Pt called stating that he had a fall off a ladder .  He states he lost consciousness and was transferred to [facility] via EMS. During transfer pt states his heart stopped. He has a complete workup for all symptoms and was sent home. He state that he is real sore and bruised so he has been icing areas especially his left back, hip leg.  He went to get up today and heard a pop in his back and has been in extreme pain. He is able to walk. He states that he has taken tramadol but it has not helped his back.  He rates pain at 9. Per protocol will go to urgent care to have his back evaluated. No appointments available at office. Care advice read to patient.  Pt verbalized understanding of all instructions.   Reason for Disposition . [1] SEVERE pain (e.g., excruciating) AND [2] not improved 2 hours after pain medicine/ice packs  Answer Assessment - Initial Assessment Questions 1. MECHANISM: "How did the injury happen?" (Consider the possibility of domestic violence or elder abuse)     Fall  Saturday about 10 feet 2. ONSET: "When did the injury happen?" (Minutes or hours ago)     Saturday 3. LOCATION: "What part of the back is injured?"     Lower back 4. SEVERITY: "Can you move the back normally?"     9 5. PAIN: "Is there any pain?" If so, ask: "How bad is the pain?"   (Scale 1-10; or mild, moderate, severe)     9 6. CORD SYMPTOMS: Any weakness or numbness of the arms or legs?"     no 7. SIZE: For cuts, bruises, or swelling, ask: "How large is it?" (e.g., inches or centimeters)     Bruises hip left, left leg, left arm, 8. TETANUS: For any breaks in the skin, ask: "When was the last tetanus booster?"     No minor 9. OTHER SYMPTOMS: "Do you have any other symptoms?" (e.g., abdominal pain, blood in urine)     no 10. PREGNANCY: "Is there any chance you are pregnant?" "When was your last menstrual period?"       N/A  Protocols used: BACK INJURY-A-AH

## 2018-03-03 NOTE — Telephone Encounter (Signed)
FYI

## 2018-03-04 NOTE — Telephone Encounter (Signed)
Pt requesting refill of Klonopin.  LRF 10/31 of #45.  Has appt 01/08.  Please see below for add'l info.  Thank you!

## 2018-03-09 ENCOUNTER — Encounter: Payer: Self-pay | Admitting: Family Medicine

## 2018-03-09 ENCOUNTER — Ambulatory Visit: Payer: BC Managed Care – PPO | Admitting: Family Medicine

## 2018-03-09 ENCOUNTER — Other Ambulatory Visit: Payer: Self-pay

## 2018-03-09 VITALS — BP 131/85 | HR 63 | Temp 98.4°F | Resp 17 | Ht 65.0 in | Wt 202.8 lb

## 2018-03-09 DIAGNOSIS — F5101 Primary insomnia: Secondary | ICD-10-CM

## 2018-03-09 DIAGNOSIS — E079 Disorder of thyroid, unspecified: Secondary | ICD-10-CM

## 2018-03-09 DIAGNOSIS — Z09 Encounter for follow-up examination after completed treatment for conditions other than malignant neoplasm: Secondary | ICD-10-CM | POA: Diagnosis not present

## 2018-03-09 DIAGNOSIS — R7989 Other specified abnormal findings of blood chemistry: Secondary | ICD-10-CM | POA: Diagnosis not present

## 2018-03-09 NOTE — Patient Instructions (Signed)
° ° ° °  If you have lab work done today you will be contacted with your lab results within the next 2 weeks.  If you have not heard from us then please contact us. The fastest way to get your results is to register for My Chart. ° ° °IF you received an x-ray today, you will receive an invoice from [facility]. Please contact [facility] at [phone] with questions or concerns regarding your invoice.  ° °IF you received labwork today, you will receive an invoice from [facility]. Please contact [facility] at [phone] with questions or concerns regarding your invoice.  ° °Our billing staff will not be able to assist you with questions regarding bills from these companies. ° °You will be contacted with the lab results as soon as they are available. The fastest way to get your results is to activate your My Chart account. Instructions are located on the last page of this paperwork. If you have not heard from us regarding the results in 2 weeks, please contact this office. °  ° ° ° °

## 2018-03-09 NOTE — Progress Notes (Signed)
Established Patient Office Visit  Subjective:  Patient ID: Casey Leonhardt., male    DOB: 20-May-1955  Age: 63 y.o. MRN: [id number]  CC:  Chief Complaint  Patient presents with  . Hospitalization Follow-up    FALL FROM LATER WHILE TRIMMING TREE    HPI Casey Barnhard. presents for    Patient reports that he was trimming a tree and fell 12 ft off a ladder  He arrested in the ambulance and was evaluated in the hospital without a know cause for the arrest and had minute of CPR due to asystole. He had nuclear stress test that was concerning for previous MI but his Echo was clear There were no bone fractures or anything noted He currently had soreness of the muscles of the left hip and leg He states that it is a miracle he is alive He has some soreness when he turns his head side to side  While in the hospital his creatinine was elevated He is drinking plenty of fluids He also had some soreness in his chest from the chest compressions from CPR  He also had an abnormal CT of the thyroid      Past Medical History:  Diagnosis Date  . Allergy    seasonal  . Arthritis    mild  . Cancer ([place])    skin  . Circadian rhythm sleep disorder, shift work type 07/17/2013  . Complication of anesthesia    nausea and vomiting  . GERD (gastroesophageal reflux disease)   . Hypertension   . Insomnia secondary to restless leg syndrome 07/17/2013  . Kidney stones   . OSA on CPAP   . PONV (postoperative nausea and vomiting)   . Sleep apnea    cpap    Past Surgical History:  Procedure Laterality Date  . acl repair left knee    . BACK SURGERY  2017  . CARDIAC CATHETERIZATION  2005   negative   . CHOLECYSTECTOMY    . CYSTOSCOPY/RETROGRADE/URETEROSCOPY/STONE EXTRACTION WITH BASKET  04/07/2012   Procedure: CYSTOSCOPY/RETROGRADE/URETEROSCOPY/STONE EXTRACTION WITH BASKET;  Surgeon: Malka So, MD;  Location: [facility];  Service: Urology;  Laterality: Bilateral;  cystoscopy, bilateral stent  removal, bilateral ureteroscopy with bilateral stone extraction and insertion of right ureteral stent  . fusion great toe right foot    . ligaments repair left wrist     . ROTATOR CUFF REPAIR     left shoulder   . skin cancer removed from left cheek    . stone extractions and urethral stents     multiple  . tendons cut on left heel    . TONSILLECTOMY    . VARICOCELE EXCISION      Family History  Problem Relation Age of Onset  . Cancer Mother        bladder cancer  . Heart disease Mother        CABG  . Cancer Father        lung cancer after asbestosis  . Hypertension Father   . Diabetes Brother   . Colon cancer Neg Hx     Social History   Socioeconomic History  . Marital status: Married    Spouse name: Casey [name]  . Number of children: 2  . Years of education: 12+   . Highest education level: Not on file  Occupational History  . 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 Financial  resource strain: Not on file  . Food insecurity:    Worry: Not on file    Inability: Not on file  . Transportation needs:    Medical: Not on file    Non-medical: Not on file  Tobacco Use  . Smoking status: Never Smoker  . Smokeless tobacco: Never Used  Substance and Sexual Activity  . Alcohol use: No    Alcohol/week: 0.0 standard drinks  . Drug use: No  . Sexual activity: Yes  Lifestyle  . Physical activity:    Days per week: Not on file    Minutes per session: Not on file  . Stress: Not on file  Relationships  . Social connections:    Talks on phone: Not on file    Gets together: Not on file    Attends religious service: Not on file    Active member of club or organization: Not on file    Attends meetings of clubs or organizations: Not on file    Relationship status: Not on file  . Intimate partner violence:    Fear of current or ex partner: Not on file    Emotionally abused: Not on file    Physically  abused: Not on file    Forced sexual activity: Not on file  Other Topics Concern  . 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   Patient is a retired Agricultural consultant.   Patient has two children.   Patient has 5 years of [employer].   Patient is right-handed.    Outpatient Medications Prior to Visit  Medication Sig Dispense Refill  . B Complex Vitamins (VITAMIN-B COMPLEX PO) Take 1 tablet by mouth daily.    . cetirizine (ZYRTEC) 10 MG tablet Take 1 tablet (10 mg total) by mouth daily. 90 tablet 3  . clonazePAM (KLONOPIN) 2 MG tablet TAKE 1.5 TABLETS (3 MG TOTAL) BY MOUTH AT BEDTIME. 45 tablet 0  . cyclobenzaprine (FLEXERIL) 10 MG tablet Take 10 mg by mouth 2 (two) times daily at 8 am and 10 pm. Per pt takes 2 tabs at bedtime since he is out of klonopin    . lisinopril-hydrochlorothiazide (PRINZIDE,ZESTORETIC) 20-12.5 MG tablet Take 1 tablet by mouth  every evening (Patient taking differently: Take 1 tablet by mouth at bedtime. ) 90 tablet 3  . naproxen sodium (ALEVE) 220 MG tablet Take 2 tablets (440 mg total) by mouth 2 (two) times daily with a meal. (Patient taking differently: Take 440 mg by mouth 2 (two) times daily. ) 60 tablet 11  . OVER THE COUNTER MEDICATION Take 2 capsules by mouth at bedtime. Physician's Choice Sleep Aid - Vitamin B6 2 mg, Chamomile flowers 100 mg, valerian root 100 mg, Theanine 50 mg, melatonin 5 mg    . pantoprazole (PROTONIX) 40 MG tablet TAKE 1 TABLET (40 MG TOTAL) BY MOUTH DAILY. (Patient taking differently: Take 40 mg by mouth daily before breakfast. ) 90 tablet 3  . Probiotic Product (PROBIOTIC DAILY PO) Take 1 capsule by mouth daily. PB8 Probiotic    . traMADol (ULTRAM) 50 MG tablet TAKE 1 TABLET (50 MG TOTAL) BY MOUTH 2 (TWO) TIMES DAILY AS NEEDED (Patient taking differently: Take 100 mg by mouth 2 (two) times daily as needed (pain). ) 15 tablet 0  . traZODone (DESYREL) 100 MG tablet Take 2 tablets by mouth  at  bedtime (Patient taking differently: Take 150 mg by mouth at bedtime. ) 180  tablet 3   No facility-administered medications prior to visit.     Allergies  Allergen Reactions  . Prednisone Other (See Comments)    Severe stomach pain  . Codeine Itching  . Hydrocodone Itching    ROS Review of Systems Review of Systems  Constitutional: Negative for activity change, appetite change, chills and fever.  HENT: Negative for congestion, nosebleeds, trouble swallowing and voice change.   Respiratory: Negative for cough, shortness of breath and wheezing.   Gastrointestinal: Negative for diarrhea, nausea and vomiting.  Genitourinary: Negative for difficulty urinating, dysuria, flank pain and hematuria.  Musculoskeletal: see hpi Neurological: Negative for dizziness, speech difficulty, light-headedness and numbness.  See HPI. All other review of systems negative.     Objective:    Physical Exam  BP 131/85 (BP Location: Right Arm, Patient Position: Sitting, Cuff Size: Large)   Pulse 63   Temp 98.4 F (36.9 C) (Oral)   Resp 17   Ht 5\' 5"  (1.651 m)   Wt 202 lb 12.8 oz (92 kg)   SpO2 96%   BMI 33.75 kg/m  Wt Readings from Last 3 Encounters:  03/09/18 202 lb 12.8 oz (92 kg)  02/28/18 198 lb 9.6 oz (90.1 kg)  02/02/18 203 lb 12.8 oz (92.4 kg)   Physical Exam  Constitutional: Oriented to person, place, and time. Appears well-developed and well-nourished.  HENT:  Head: Normocephalic and atraumatic.  Eyes: Conjunctivae and EOM are normal.  Neck: supple, no thyromegaly or palpable masses Cardiovascular: Normal rate, regular rhythm, normal heart sounds and intact distal pulses.  No murmur heard. Pulmonary/Chest: Effort normal and breath sounds normal. No stridor. No respiratory distress. Has no wheezes.  Neurological: Is alert and oriented to person, place, and time.  Skin: Skin is warm. Capillary refill takes less than 2 seconds.  Psychiatric: Has a normal mood and affect. Behavior  is normal. Judgment and thought content normal.    There are no preventive care reminders to display for this patient.  There are no preventive care reminders to display for this patient.  Lab Results  Component Value Date   TSH 0.674 05/01/2014   Lab Results  Component Value Date   WBC 8.5 02/27/2018   HGB 13.1 02/27/2018   HCT 38.5 (L) 02/27/2018   MCV 92.3 02/27/2018   PLT 212 02/27/2018   Lab Results  Component Value Date   NA 144 02/27/2018   K 4.0 02/27/2018   CO2 23 02/27/2018   GLUCOSE 86 02/27/2018   BUN 23 02/27/2018   CREATININE 1.37 (H) 02/27/2018   BILITOT 0.8 02/26/2018   ALKPHOS 61 02/26/2018   AST 27 02/26/2018   ALT 30 02/26/2018   PROT 5.9 (L) 02/26/2018   ALBUMIN 3.7 02/26/2018   CALCIUM 8.7 (L) 02/27/2018   ANIONGAP 10 02/27/2018   Lab Results  Component Value Date   CHOL 117 02/27/2018   Lab Results  Component Value Date   HDL 28 (L) 02/27/2018   Lab Results  Component Value Date   LDLCALC 79 02/27/2018   Lab Results  Component Value Date   TRIG 50 02/27/2018   Lab Results  Component Value Date   CHOLHDL 4.2 02/27/2018   No results found for: HGBA1C  IMPRESSION: 1. No evidence for acute intracranial abnormality. 2. Mild sinus disease. 3. No evidence for acute cervical spine abnormality. 4. Partially calcified mass within the UPPER pole of the RIGHT lobe of the thyroid. Recommend further evaluation with thyroid ultrasound based on consensus criteria.  Electronically Signed   By: Nolon Nations M.D.   On: 02/26/2018 17:33     Assessment & Plan:   Problem List Items Addressed This Visit      Other   Insomnia  -  Already refilled his clonazepam    Other Visit Diagnoses    Hospital discharge follow-up    -  Primary  Reviewed hospital course and discharge summary Pt to follow up with Cardiology in one month Will check creatine today due to elevation     Elevated serum creatinine    - creatinine elevated past  baseline   Relevant Orders   Basic metabolic panel    Thyroid mass - incidental finding of right calcified thyroid mass Will order Ultrasound to evaluate   No orders of the defined types were placed in this encounter.   Follow-up: No follow-ups on file.    Forrest Moron, MD

## 2018-03-10 LAB — BASIC METABOLIC PANEL
BUN/Creatinine Ratio: 19 (ref 10–24)
BUN: 23 mg/dL (ref 8–27)
CO2: 23 mmol/L (ref 20–29)
Calcium: 9.1 mg/dL (ref 8.6–10.2)
Chloride: 102 mmol/L (ref 96–106)
Creatinine, Ser: 1.21 mg/dL (ref 0.76–1.27)
GFR calc non Af Amer: 63 mL/min/{1.73_m2} (ref >59)
GFR, EST AFRICAN AMERICAN: 73 mL/min/{1.73_m2} (ref >59)
Glucose: 88 mg/dL (ref 65–99)
Potassium: 4.6 mmol/L (ref 3.5–5.2)
Sodium: 140 mmol/L (ref 134–144)

## 2018-03-11 ENCOUNTER — Other Ambulatory Visit: Payer: Self-pay

## 2018-03-11 ENCOUNTER — Ambulatory Visit: Payer: Self-pay | Admitting: Family Medicine

## 2018-03-11 ENCOUNTER — Encounter: Payer: Self-pay | Admitting: Family Medicine

## 2018-03-11 VITALS — BP 120/76 | HR 76 | Temp 98.6°F | Ht 65.0 in | Wt 202.4 lb

## 2018-03-11 DIAGNOSIS — Z024 Encounter for examination for driving license: Secondary | ICD-10-CM

## 2018-03-11 DIAGNOSIS — Z8674 Personal history of sudden cardiac arrest: Secondary | ICD-10-CM

## 2018-03-11 DIAGNOSIS — Z8669 Personal history of other diseases of the nervous system and sense organs: Secondary | ICD-10-CM

## 2018-03-11 NOTE — Progress Notes (Signed)
By signing my name below, I, .Temidayo Atanda-Ogunleye, attest that this documentation has been prepared under the direction and in the presence of Wendie Agreste, MD. Electronically Signed: Stephania Fragmin, Scribe 03/11/2018 at 1:17 PM. Subjective:    Patient ID: Casey Alma., male    DOB: 06-02-1955, 63 y.o.   MRN: [id number] Chief Complaint  Patient presents with  . Employment Physical    DOT   HPI Casey [name]. is a 63 y.o. male who presents to Primary Care at [facility] for a DOT Physical.  Recently seen 03/09/2018 after a fall. He arrested in the ambulance requiring CPR due to asystole. Nuclear stress test (02/27/18) concerning for possible previous MI and mild inferior hypokinesis. Echo was clear. Plan for cardiology follow up. Normal EF 55-65% and normal wall motion. Grade I diastolic dysfunction. Chart indicates negative cardiac cath in 2005. Note reviewed from cardiology 02/27/2018, no reversible ischemia on nuclear stress test. Troponin was negativex3, and reportedly his EKG was normal pre- and post- asystole.  Since the fall he denies any weakness or pain with movement in joints.  He reports that within the last 3 months he lost 30 lbs.  Wt Readings from Last 3 Encounters:  03/11/18 202 lb 6.4 oz (91.8 kg)  03/09/18 202 lb 12.8 oz (92 kg)  02/28/18 198 lb 9.6 oz (90.1 kg)     History of OSA  He stopped using his CPAP machine around 4 years ago (2015). Follow up testing at [facility] with Dr. Brett Fairy, 12/06/2013, she strongly recommended he continue CPAP at that time.  Denies daytime sleepiness and snoring.  History of Insomnia He uses klonopin and trazapan at bedtime. Reports taking 1.5 pills. He does not take any sedative medications throughout the day. He denies any drowsiness, and other associated symptoms with his nighttime medications. Not taking sedative medications such as Klonopin or tramadol if driving.  History of HTN Currently on  medication that he reports is managing his symptoms well. BP Readings from Last 3 Encounters:  03/11/18 120/76  03/09/18 131/85  02/28/18 111/78   Denies chest pains and shortness of breath currently.  History of nephrolithiasis He has had multiple kidney stones in the past and a his last occurrence about 5 months ago. His stone symptoms are not debilitating or severe enough to prevent his ability to pull a vehicle over.  No history of diabetes. Past Medical History:  Diagnosis Date  . Allergy    seasonal  . Arthritis    mild  . Cancer ([place])    skin  . Circadian rhythm sleep disorder, shift work type 07/17/2013  . Complication of anesthesia    nausea and vomiting  . GERD (gastroesophageal reflux disease)   . Hypertension   . Insomnia secondary to restless leg syndrome 07/17/2013  . Kidney stones   . OSA on CPAP   . PONV (postoperative nausea and vomiting)   . Sleep apnea    cpap   Past Surgical History:  Procedure Laterality Date  . acl repair left knee    . BACK SURGERY  2017  . CARDIAC CATHETERIZATION  2005   negative   . CHOLECYSTECTOMY    . CYSTOSCOPY/RETROGRADE/URETEROSCOPY/STONE EXTRACTION WITH BASKET  04/07/2012   Procedure: CYSTOSCOPY/RETROGRADE/URETEROSCOPY/STONE EXTRACTION WITH BASKET;  Surgeon: Malka So, MD;  Location: [facility];  Service: Urology;  Laterality: Bilateral;  cystoscopy, bilateral stent removal, bilateral ureteroscopy with bilateral stone extraction and insertion of right ureteral stent  . fusion great toe right  foot    . ligaments repair left wrist     . ROTATOR CUFF REPAIR     left shoulder   . skin cancer removed from left cheek    . stone extractions and urethral stents     multiple  . tendons cut on left heel    . TONSILLECTOMY    . VARICOCELE EXCISION     Allergies  Allergen Reactions  . Prednisone Other (See Comments)    Severe stomach pain  . Codeine Itching  . Hydrocodone Itching   Prior to Admission medications   Medication  Sig Start Date End Date Taking? Authorizing Provider  B Complex Vitamins (VITAMIN-B COMPLEX PO) Take 1 tablet by mouth daily.   Yes [provider]  cetirizine (ZYRTEC) 10 MG tablet Take 1 tablet (10 mg total) by mouth daily. 12/30/17  Yes Stallings, Zoe A, MD  clonazePAM (KLONOPIN) 2 MG tablet TAKE 1.5 TABLETS (3 MG TOTAL) BY MOUTH AT BEDTIME. 03/08/18  Yes Stallings, Zoe A, MD  cyclobenzaprine (FLEXERIL) 10 MG tablet Take 10 mg by mouth 2 (two) times daily at 8 am and 10 pm. Per pt takes 2 tabs at bedtime since he is out of klonopin   Yes [provider]  lisinopril-hydrochlorothiazide (PRINZIDE,ZESTORETIC) 20-12.5 MG tablet Take 1 tablet by mouth  every evening Patient taking differently: Take 1 tablet by mouth at bedtime.  12/30/17  Yes Forrest Moron, MD  naproxen sodium (ALEVE) 220 MG tablet Take 2 tablets (440 mg total) by mouth 2 (two) times daily with a meal. Patient taking differently: Take 440 mg by mouth 2 (two) times daily.  12/30/17  Yes Forrest Moron, MD  OVER THE COUNTER MEDICATION Take 2 capsules by mouth at bedtime. Physician's Choice Sleep Aid - Vitamin B6 2 mg, Chamomile flowers 100 mg, valerian root 100 mg, Theanine 50 mg, melatonin 5 mg   Yes [provider]  pantoprazole (PROTONIX) 40 MG tablet TAKE 1 TABLET (40 MG TOTAL) BY MOUTH DAILY. Patient taking differently: Take 40 mg by mouth daily before breakfast.  12/30/17  Yes Stallings, Zoe A, MD  Probiotic Product (PROBIOTIC DAILY PO) Take 1 capsule by mouth daily. PB8 Probiotic   Yes [provider]  traMADol (ULTRAM) 50 MG tablet TAKE 1 TABLET (50 MG TOTAL) BY MOUTH 2 (TWO) TIMES DAILY AS NEEDED Patient taking differently: Take 100 mg by mouth 2 (two) times daily as needed (pain).  12/30/17  Yes Forrest Moron, MD  traZODone (DESYREL) 100 MG tablet Take 2 tablets by mouth at  bedtime 12/30/17  Yes Forrest Moron, MD   Social History   Socioeconomic History  . Marital status:  Married    Spouse name: [name]  . Number of children: 2  . Years of education: 12+   . Highest education level: Not on file  Occupational History  . Occupation: Dealer 1    Employer: [employer]: [employer]  Social Needs  . Financial resource strain: Not on file  . Food insecurity:    Worry: Not on file    Inability: Not on file  . Transportation needs:    Medical: Not on file    Non-medical: Not on file  Tobacco Use  . Smoking status: Never Smoker  . Smokeless tobacco: Never Used  Substance and Sexual Activity  . Alcohol use: No    Alcohol/week: 0.0 standard drinks  . Drug use: No  . Sexual activity: Yes  Lifestyle  . Physical activity:    Days per week: Not on file    Minutes per session: Not on file  . Stress: Not on file  Relationships  . Social connections:    Talks on phone: Not on file    Gets together: Not on file    Attends religious service: Not on file    Active member of club or organization: Not on file    Attends meetings of clubs or organizations: Not on file    Relationship status: Not on file  . Intimate partner violence:    Fear of current or ex partner: Not on file    Emotionally abused: Not on file    Physically abused: Not on file    Forced sexual activity: Not on file  Other Topics Concern  . 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   Patient is a retired Agricultural consultant.   Patient has two children.   Patient has 5 years of [employer].   Patient is right-handed.   Review of Systems  Constitutional: Negative for fatigue and fever.  Respiratory: Negative for cough, chest tightness and shortness of breath.   Cardiovascular: Negative for chest pain and palpitations.  see dot physical.     Objective:   Physical Exam Vitals signs reviewed.  Constitutional:      General: He is not in acute distress.    Appearance: Normal appearance. He is  well-developed.  HENT:     Head: Normocephalic and atraumatic.     Right Ear: External ear normal.     Left Ear: External ear normal.  Eyes:     Conjunctiva/sclera: Conjunctivae normal.     Pupils: Pupils are equal, round, and reactive to light.  Neck:     Musculoskeletal: Normal range of motion and neck supple.     Thyroid: No thyromegaly.  Cardiovascular:     Rate and Rhythm: Normal rate and regular rhythm.     Heart sounds: Normal heart sounds.  Pulmonary:     Effort: Pulmonary effort is normal. No respiratory distress.     Breath sounds: Normal breath sounds. No decreased breath sounds, wheezing, rhonchi or rales.  Abdominal:     General: There is no distension.     Palpations: Abdomen is soft.     Tenderness: There is no abdominal tenderness.     Hernia: There is no hernia in the right inguinal area or left inguinal area.  Musculoskeletal: Normal range of motion.        General: No tenderness.     Right lower leg: No edema.     Left lower leg: No edema.  Lymphadenopathy:     Cervical: No cervical adenopathy.  Skin:    General: Skin is warm and dry.     Findings: No abrasion, erythema, lesion or rash.  Neurological:     Mental Status: He is alert and oriented to person, place, and time.     Deep Tendon Reflexes: Reflexes are normal and symmetric.  Psychiatric:        Behavior: Behavior normal. Behavior is cooperative.     Vitals:   03/11/18 1141  BP: 120/76  Pulse: 76  Temp: 98.6 F (37 C)  TempSrc: Oral  SpO2: 98%  Weight: 202 lb 6.4 oz (91.8 kg)  Height: 5\' 5"  (1.651 m)    Visual Acuity Screening   Right eye Left eye Both eyes  Without  correction: 20/20 20/15 20/15   With correction:     Comments: The patient can distinguish the colors red, amber and green.  Peripheral Vision: Right eye 85 degrees. Left eye 85 degrees.  Hearing Screening Comments: The patient was able to hear a forced whisper from 8 feet.      Assessment & Plan:   Casey Ager. is a 63 y.o. male Encounter for commercial driver medical examination (CDME)  History of cardiac arrest  History of sleep apnea - Plan: Ambulatory referral to Sleep Studies   See DOT paperwork. With recent cardiac arrest, will need cardiology clearance first. Additionally with hx of sleep apnea, abnormal sleep study in past, will need updated testing with sleep specialist. Could consider inservice eval for this as asymptomatic if cleared to drive. Referral placed. Understanding expressed of plan.   No orders of the defined types were placed in this encounter.  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  No not take sedating medications such as Klonopin or tramadol if driving.   I will refer you back to sleep specialist for updated testing, but glad to hear you are not snoring or daytime sleepiness. Depending on cardiac clearance I may be able to prove a temporary card for 3 months for that evaluation to occur.     If you have lab work done today you will be contacted with your lab results within the next 2 weeks.  If you have not heard from [place] then please contact us. The fastest way to get your results is to register for My Chart.   IF you received an x-ray today, you will receive an invoice from [facility]. Please contact [facility] at [phone] with questions or concerns regarding your invoice.   IF you received labwork today, you will receive an invoice from [employer]. Please contact [facility] at [phone] with questions or concerns regarding your invoice.   Our billing staff will not be able to assist you with questions regarding bills from these companies.  You will be contacted with the lab results as soon as they are available. The fastest way to get your results is to activate  your My Chart account. Instructions are located on the last page of this paperwork. If you have not heard from [place] regarding the results in 2 weeks, please contact this office.       I personally performed the services described in this documentation, which was scribed in my presence. The recorded information has been reviewed and considered for accuracy and completeness, addended by me as needed, and agree with information above.  Signed,   Merri Ray, MD Primary Care at [place].  03/20/18 10:50 PM

## 2018-03-11 NOTE — Patient Instructions (Addendum)
  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  No not take sedating medications such as Klonopin or tramadol if driving.   I will refer you back to sleep specialist for updated testing, but glad to hear you are not snoring or daytime sleepiness. Depending on cardiac clearance I may be able to prove a temporary card for 3 months for that evaluation to occur.     If you have lab work done today you will be contacted with your lab results within the next 2 weeks.  If you have not heard from [place] then please contact us. The fastest way to get your results is to register for My Chart.   IF you received an x-ray today, you will receive an invoice from [facility]. Please contact [facility] at [phone] with questions or concerns regarding your invoice.   IF you received labwork today, you will receive an invoice from [place]. Please contact [facility] at [phone] with questions or concerns regarding your invoice.   Our billing staff will not be able to assist you with questions regarding bills from these companies.  You will be contacted with the lab results as soon as they are available. The fastest way to get your results is to activate your My Chart account. Instructions are located on the last page of this paperwork. If you have not heard from [place] regarding the results in 2 weeks, please contact this office.

## 2018-03-14 ENCOUNTER — Other Ambulatory Visit: Payer: Self-pay

## 2018-04-01 ENCOUNTER — Other Ambulatory Visit: Payer: Self-pay | Admitting: Family Medicine

## 2018-04-04 ENCOUNTER — Other Ambulatory Visit: Payer: Self-pay | Admitting: Family Medicine

## 2018-04-04 ENCOUNTER — Telehealth: Payer: Self-pay | Admitting: Family Medicine

## 2018-04-04 MED ORDER — PANTOPRAZOLE SODIUM 40 MG PO TBEC: DELAYED_RELEASE_TABLET | ORAL | 0 refills | Status: DC

## 2018-04-04 MED ORDER — TRAZODONE HCL 100 MG PO TABS: ORAL_TABLET | ORAL | 3 refills | Status: DC

## 2018-04-04 MED ORDER — CLONAZEPAM 2 MG PO TABS: 3.0000 mg | ORAL_TABLET | Freq: Every day | ORAL | 0 refills | Status: DC

## 2018-04-04 MED ORDER — LISINOPRIL-HYDROCHLOROTHIAZIDE 20-12.5 MG PO TABS: ORAL_TABLET | ORAL | 0 refills | Status: DC

## 2018-04-04 NOTE — Telephone Encounter (Signed)
Med request Clonazepam 2mg  and Trazadone 100 mg. Last office visit 03/09/2018. Follow up 06/16/2018

## 2018-04-04 NOTE — Telephone Encounter (Signed)
Requested medication (s) are due for refill today -yes   Requested medication (s) are on the active medication list -yes  Future visit scheduled -yes  Last refill: Clonazepam - 03/08/18                  Trazodone- 12/31/18  Notes to clinic: Patient is requesting non delegated medication and medication used for sleep for RF- sent for PCP review.  Requested Prescriptions  Pending Prescriptions Disp Refills   clonazePAM (KLONOPIN) 2 MG tablet 45 tablet 0    Sig: Take 1.5 tablets (3 mg total) by mouth at bedtime.     Not Delegated - Psychiatry:  Anxiolytics/Hypnotics Failed - 04/04/2018 11:59 AM      Failed - This refill cannot be delegated      Failed - Urine Drug Screen completed in last 360 days.      Passed - Valid encounter within last 6 months    Recent Outpatient Visits          3 weeks ago Encounter for commercial driver medical examination (CDME)   Primary [facility] at [facility], Ranell Patrick, MD   3 weeks ago Hospital discharge follow-up   Primary Care at [facility], Arlie Solomons, MD   2 months ago Cough   Primary Care at [facility], [place], MD   3 months ago Acute pain of left knee   Primary Care at [facility], Arlie Solomons, MD   7 months ago Shift work sleep disorder   Primary Care at [facility], Arlie Solomons, MD      Future Appointments            In 2 months Forrest Moron, MD Primary Care at [place], [place]          traZODone (DESYREL) 100 MG tablet 180 tablet 3    Sig: Take 2 tablets by mouth at  bedtime     Psychiatry: Antidepressants - Serotonin Modulator Passed - 04/04/2018 11:59 AM      Passed - Valid encounter within last 6 months    Recent Outpatient Visits          3 weeks ago Encounter for commercial driver medical examination (CDME)   [facility] at [facility], Ranell Patrick, MD   3 weeks ago Hospital discharge follow-up   Primary Care at [facility], Arlie Solomons, MD   2 months ago Cough   Primary Care at [facility], Ines Bloomer, MD   3  months ago Acute pain of left knee   Primary Care at [facility], Arlie Solomons, MD   7 months ago Shift work sleep disorder   Primary Care at [place], MD      Future Appointments            In 2 months Forrest Moron, MD Primary Care at [place], [facility]         Signed Prescriptions Disp Refills   lisinopril-hydrochlorothiazide (PRINZIDE,ZESTORETIC) 20-12.5 MG tablet 90 tablet 0    Sig: Take 1 tablet by mouth  every evening     Cardiovascular:  ACEI + Diuretic Combos Passed - 04/04/2018 11:59 AM      Passed - Na in normal range and within 180 days    Sodium  Date Value Ref Range Status  03/09/2018 140 134 - 144 mmol/L Final         Passed - K in normal range and within 180 days    Potassium  Date Value Ref  Range Status  03/09/2018 4.6 3.5 - 5.2 mmol/L Final         Passed - Cr in normal range and within 180 days    Creat  Date Value Ref Range Status  10/16/2015 1.18 0.70 - 1.25 mg/dL Final    Comment:      For patients > or = 64 years of age: The upper reference limit for Creatinine is approximately 13% higher for people identified as African-American.      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     Gastroenterology: Proton Pump Inhibitors Passed - 04/04/2018 11:59 AM      Passed - Valid encounter within last 12 months    Recent Outpatient Visits          3 weeks ago Encounter for commercial driver medical examination (CDME)   Primary Care at [facility], Ranell Patrick, MD   3 weeks ago Hospital discharge follow-up   Primary Care at [facility], Arlie Solomons, MD   2 months ago Cough   Primary Care at [facility], [place], MD   3 months ago Acute pain of left knee   Primary Care at [facility], Arlie Solomons, MD   7 months ago Shift work sleep disorder   Primary Care at [place], MD      Future Appointments            In 2 months Forrest Moron, MD Primary Care at [place], [facility]            Requested Prescriptions  Pending Prescriptions Disp Refills   clonazePAM (KLONOPIN) 2 MG tablet 45 tablet 0    Sig: Take 1.5 tablets (3 mg total) by mouth at bedtime.     Not Delegated - Psychiatry:  Anxiolytics/Hypnotics Failed - 04/04/2018 11:59 AM      Failed - This refill cannot be delegated      Failed - Urine  Drug Screen completed in last 360 days.      Passed - Valid encounter within last 6 months    Recent Outpatient Visits          3 weeks ago Encounter for commercial driver medical examination (CDME)   Primary Care at [facility], Ranell Patrick, MD   3 weeks ago Hospital discharge follow-up   Primary Care at [facility], Arlie Solomons, MD   2 months ago Cough   Primary Care at [facility], Ines Bloomer, MD   3 months ago Acute pain of left knee   Primary Care at [facility], Arlie Solomons, MD   7 months ago Shift work sleep disorder    Primary Care at [facility], Arlie Solomons, MD      Future Appointments            In 2 months Forrest Moron, MD Primary Care at [place], [place]          traZODone (DESYREL) 100 MG tablet 180 tablet 3    Sig: Take 2 tablets by mouth at  bedtime     Psychiatry: Antidepressants - Serotonin Modulator Passed - 04/04/2018 11:59 AM      Passed - Valid encounter within last 6 months    Recent Outpatient Visits          3 weeks ago Encounter for commercial driver medical examination (CDME)   Primary Care at [facility], Ranell Patrick, MD   3 weeks ago Hospital discharge follow-up   Primary Care at [facility], Arlie Solomons, MD   2 months ago Cough   Primary Care at [facility], Ines Bloomer, MD   3 months ago Acute pain of left knee   Primary Care at [facility], Arlie Solomons, MD   7 months ago Shift work sleep disorder   Primary Care at [place], MD      Future Appointments            In 2 months Forrest Moron, MD Primary Care at [place], [facility]         Signed Prescriptions Disp Refills   lisinopril-hydrochlorothiazide (PRINZIDE,ZESTORETIC) 20-12.5 MG tablet 90 tablet 0    Sig: Take 1 tablet by mouth  every evening     Cardiovascular:  ACEI + Diuretic Combos Passed - 04/04/2018 11:59 AM      Passed - Na in normal range and within 180 days    Sodium  Date Value Ref Range Status  03/09/2018 140 134 - 144 mmol/L Final         Passed - K in normal range and within 180 days    Potassium  Date Value Ref Range Status  03/09/2018 4.6 3.5 - 5.2 mmol/L Final         Passed - Cr in normal range and within 180 days    Creat  Date Value Ref Range Status  10/16/2015 1.18 0.70 - 1.25 mg/dL Final    Comment:      For patients > or = 64 years of age: The upper reference limit for Creatinine is approximately 13% higher for people identified as African-American.      Creatinine, Ser  Date Value Ref Range Status  03/09/2018 1.21 0.76 - 1.27 mg/dL Final         Passed -  Ca in normal range and within 180 days    Calcium  Date Value Ref Range Status  03/09/2018 9.1 8.6 - 10.2 mg/dL Final  Passed - Patient is not pregnant      Passed - Last BP in normal range    BP Readings from Last 1 Encounters:  03/11/18 120/76         Passed - Valid encounter within last 6 months    Recent Outpatient Visits          3 weeks ago Encounter for commercial driver medical examination (CDME)   Primary Care at [facility], Ranell Patrick, MD   3 weeks ago Hospital discharge follow-up   Primary Care at [facility], Arlie Solomons, MD   2 months ago Cough   Primary Care at [facility], [place], MD   3 months ago Acute pain of left knee   Primary Care at [place], MD   7 months ago Shift work sleep disorder   Primary Care at [place], MD      Future Appointments            In 2 months Forrest Moron, MD Primary Care at [place], [facility]          pantoprazole (PROTONIX) 40 MG tablet 90 tablet 0    Sig: TAKE 1 TABLET (40 MG TOTAL) BY MOUTH DAILY.     Gastroenterology: Proton Pump Inhibitors Passed - 04/04/2018 11:59 AM      Passed - Valid encounter within last 12 months    Recent Outpatient Visits          3 weeks ago Encounter for commercial driver medical examination (CDME)   Primary Care at [facility], Ranell Patrick, MD   3 weeks ago Hospital discharge follow-up   Primary Care at [facility], Arlie Solomons, MD   2 months ago Cough   Primary Care at [facility], [place], MD   3 months ago Acute pain of left knee   Primary Care at [place], MD   7 months ago Shift work sleep disorder   Primary Care at [place], MD      Future Appointments            In 2 months Forrest Moron, MD Primary Care at [place], [facility]

## 2018-04-04 NOTE — Telephone Encounter (Signed)
Requested medication (s) are due for refill today: Yes   Requested medication (s) are on the active medication list: Yes  Last refill:  03/08/18  Future visit scheduled: Yes  Notes to clinic:  See request    Requested Prescriptions  Pending Prescriptions Disp Refills   clonazePAM (KLONOPIN) 2 MG tablet [Pharmacy Med Name: CLONAZEPAM 2 MG TABLET] 45 tablet 0    Sig: TAKE 1.5 TABLETS (3 MG TOTAL) BY MOUTH AT BEDTIME.     Not Delegated - Psychiatry:  Anxiolytics/Hypnotics Failed - 04/01/2018  8:22 PM      Failed - This refill cannot be delegated      Failed - Urine Drug Screen completed in last 360 days.      Passed - Valid encounter within last 6 months    Recent Outpatient Visits          3 weeks ago Encounter for commercial driver medical examination (CDME)   [facility] at [facility], Ranell Patrick, MD   3 weeks ago Hospital discharge follow-up   [facility] at [facility], Arlie Solomons, MD   2 months ago Cough   [facility] Care at [facility], [place], MD   3 months ago Acute pain of left knee   [facility] at [place], MD   7 months ago Shift work sleep disorder   Primary [facility] at [place], MD      Future Appointments            In 2 months Forrest Moron, MD Primary [facility] at [place], [facility]

## 2018-04-04 NOTE — Telephone Encounter (Signed)
Copied from [place] [id number]. Topic: Quick Communication - See Telephone Encounter >> Apr 04, 2018 11:31 AM [name] wrote: CRM for notification. See Telephone encounter for: 04/04/18.  Patient is requesting Dr. Bridget Hartshorn assistant [name] to call him back regarding medications. Patient was in the office on 03/09/18. Patient is requesting a medication refill on all of medications. I have put a request in. Please advise

## 2018-04-04 NOTE — Telephone Encounter (Signed)
Copied from [place] [phone]. Topic: Quick Communication - Rx Refill/Question >> Apr 04, 2018 11:32 AM [name] wrote: Medication: clonazePAM (KLONOPIN) 2 MG tablet [[id number]] , lisinopril-hydrochlorothiazide (PRINZIDE,ZESTORETIC) 20-12.5 MG tablet [[id number]] , traZODone (DESYREL) 100 MG tablet [[id number]] , pantoprazole (PROTONIX) 40 MG tablet [[id number]]   Has the patient contacted their pharmacy? Yes  (Agent: If no, request that the patient contact the pharmacy for the refill.) (Agent: If yes, when and what did the pharmacy advise?)  Preferred Pharmacy (with phone number or street name): [id number] - [place], [place] [phone] (Phone) [phone] (Fax)    Agent: Please be advised that RX refills may take up to 3 business days. We ask that you follow-up with your pharmacy.

## 2018-04-04 NOTE — Telephone Encounter (Signed)
Patient is calling to speak to delores regarding his medication refill. Patient states that he is out of medication. And he would not be in this position if Dr. Nolon Rod would have refilled his medication at his appt. Please advise.

## 2018-04-06 NOTE — Telephone Encounter (Signed)
Please advise 

## 2018-04-13 ENCOUNTER — Encounter: Payer: BC Managed Care – PPO | Admitting: Family Medicine

## 2018-04-13 ENCOUNTER — Other Ambulatory Visit: Payer: Self-pay | Admitting: Family Medicine

## 2018-04-13 MED ORDER — CLONAZEPAM 2 MG PO TABS: 3.0000 mg | ORAL_TABLET | Freq: Every day | ORAL | 3 refills | Status: DC

## 2018-04-13 NOTE — Telephone Encounter (Signed)
Dr Nolon Rod will refill pt medication as advised. Dgaddy, CMA

## 2018-04-13 NOTE — Telephone Encounter (Signed)
Spoke with pt advised klonopin #45 with 2 refills sent to pharmacy.  Pt appreciative. Dgaddy, cma

## 2018-04-20 ENCOUNTER — Ambulatory Visit: Payer: BC Managed Care – PPO | Admitting: Emergency Medicine

## 2018-04-20 ENCOUNTER — Encounter: Payer: Self-pay | Admitting: Emergency Medicine

## 2018-04-20 VITALS — BP 124/83 | HR 83 | Temp 98.6°F | Resp 16 | Ht 65.0 in | Wt 203.6 lb

## 2018-04-20 DIAGNOSIS — R059 Cough, unspecified: Secondary | ICD-10-CM

## 2018-04-20 DIAGNOSIS — J22 Unspecified acute lower respiratory infection: Secondary | ICD-10-CM | POA: Diagnosis not present

## 2018-04-20 DIAGNOSIS — R0989 Other specified symptoms and signs involving the circulatory and respiratory systems: Secondary | ICD-10-CM

## 2018-04-20 DIAGNOSIS — R05 Cough: Secondary | ICD-10-CM | POA: Diagnosis not present

## 2018-04-20 MED ORDER — PSEUDOEPHEDRINE-GUAIFENESIN ER 60-600 MG PO TB12: 1.0000 | ORAL_TABLET | Freq: Two times a day (BID) | ORAL | 1 refills | Status: AC

## 2018-04-20 MED ORDER — BENZONATATE 200 MG PO CAPS: 200.0000 mg | ORAL_CAPSULE | Freq: Two times a day (BID) | ORAL | 0 refills | Status: DC | PRN

## 2018-04-20 MED ORDER — AMOXICILLIN-POT CLAVULANATE 875-125 MG PO TABS: 1.0000 | ORAL_TABLET | Freq: Two times a day (BID) | ORAL | 0 refills | Status: AC

## 2018-04-20 NOTE — Progress Notes (Signed)
[place] 63 y.o.   Chief Complaint  Patient presents with  . Head congestion    "yellow mucus x 3 days, per pt "had these same symptoms 02/02/18"  . Cough    x 3 days, coughing up a little gray mucus    HISTORY OF PRESENT ILLNESS: This is a 63 y.o. male complaining of 3-day history of flulike symptoms with head and chest congestion and productive cough.  Cough  This is a new problem. The current episode started in the past 7 days. The problem has been gradually worsening. The problem occurs constantly. The cough is productive of sputum. Associated symptoms include ear congestion, a fever, nasal congestion and a sore throat. Pertinent negatives include no chest pain, chills, ear pain, headaches, heartburn, hemoptysis, myalgias, rash, rhinorrhea, shortness of breath, weight loss or wheezing. He has tried nothing for the symptoms.     Prior to Admission medications   Medication Sig Start Date End Date Taking? Authorizing Provider  B Complex Vitamins (VITAMIN-B COMPLEX PO) Take 1 tablet by mouth daily.   Yes [provider]  cetirizine (ZYRTEC) 10 MG tablet Take 1 tablet (10 mg total) by mouth daily. 12/30/17  Yes Stallings, Zoe A, MD  clonazePAM (KLONOPIN) 2 MG tablet Take 1.5 tablets (3 mg total) by mouth at bedtime. 04/13/18  Yes Forrest Moron, MD  cyclobenzaprine (FLEXERIL) 10 MG tablet Take 10 mg by mouth 2 (two) times daily at 8 am and 10 pm. Per pt takes 2 tabs at bedtime since he is out of klonopin   Yes [provider]  lisinopril-hydrochlorothiazide (PRINZIDE,ZESTORETIC) 20-12.5 MG tablet Take 1 tablet by mouth  every evening 04/04/18  Yes Stallings, Zoe A, MD  naproxen sodium (ALEVE) 220 MG tablet Take 2 tablets (440 mg total) by mouth 2 (two) times daily with a meal. Patient taking differently: Take 440 mg by mouth 2 (two) times daily.  12/30/17  Yes Forrest Moron, MD  OVER THE COUNTER MEDICATION Take 2 capsules by mouth at bedtime. Physician's  Choice Sleep Aid - Vitamin B6 2 mg, Chamomile flowers 100 mg, valerian root 100 mg, Theanine 50 mg, melatonin 5 mg   Yes [provider]  pantoprazole (PROTONIX) 40 MG tablet TAKE 1 TABLET (40 MG TOTAL) BY MOUTH DAILY. 04/04/18  Yes Forrest Moron, MD  Probiotic Product (PROBIOTIC DAILY PO) Take 1 capsule by mouth daily. PB8 Probiotic   Yes [provider]  traMADol (ULTRAM) 50 MG tablet TAKE 1 TABLET (50 MG TOTAL) BY MOUTH 2 (TWO) TIMES DAILY AS NEEDED Patient taking differently: Take 100 mg by mouth 2 (two) times daily as needed (pain).  12/30/17  Yes Forrest Moron, MD  traZODone (DESYREL) 100 MG tablet Take 2 tablets by mouth at  bedtime 04/04/18  Yes Forrest Moron, MD    Allergies  Allergen Reactions  . Prednisone Other (See Comments)    Severe stomach pain  . Codeine Itching  . Hydrocodone Itching    Patient Active Problem List   Diagnosis Date Noted  . Syncope 02/26/2018  . Cough 02/02/2018  . Sinus congestion 02/02/2018  . Acute non-recurrent maxillary sinusitis 02/02/2018  . HTN (hypertension) 03/24/2016  . GERD (gastroesophageal reflux disease)   . Insomnia with sleep apnea 12/06/2013  . OSA on CPAP 12/06/2013  . Insomnia secondary to restless leg syndrome 07/17/2013  . Circadian rhythm sleep disorder, shift work type 07/17/2013  . Osteoarthritis, multiple sites 04/26/2011  . Insomnia 04/26/2011  Past Medical History:  Diagnosis Date  . Allergy    seasonal  . Arthritis    mild  . Cancer ([place])    skin  . Circadian rhythm sleep disorder, shift work type 07/17/2013  . Complication of anesthesia    nausea and vomiting  . GERD (gastroesophageal reflux disease)   . Hypertension   . Insomnia secondary to restless leg syndrome 07/17/2013  . Kidney stones   . OSA on CPAP   . PONV (postoperative nausea and vomiting)   . Sleep apnea    cpap    Past Surgical History:  Procedure Laterality Date  . acl repair left knee    . BACK SURGERY  2017    . CARDIAC CATHETERIZATION  2005   negative   . CHOLECYSTECTOMY    . CYSTOSCOPY/RETROGRADE/URETEROSCOPY/STONE EXTRACTION WITH BASKET  04/07/2012   Procedure: CYSTOSCOPY/RETROGRADE/URETEROSCOPY/STONE EXTRACTION WITH BASKET;  Surgeon: Malka So, MD;  Location: [facility];  Service: Urology;  Laterality: Bilateral;  cystoscopy, bilateral stent removal, bilateral ureteroscopy with bilateral stone extraction and insertion of right ureteral stent  . fusion great toe right foot    . ligaments repair left wrist     . ROTATOR CUFF REPAIR     left shoulder   . skin cancer removed from left cheek    . stone extractions and urethral stents     multiple  . tendons cut on left heel    . TONSILLECTOMY    . VARICOCELE EXCISION      Social History   Socioeconomic History  . Marital status: Married    Spouse name: [name]  . Number of children: 2  . Years of education: 12+   . Highest education level: Not on file  Occupational History  . 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 Financial resource strain: Not on file  . Food insecurity:    Worry: Not on file    Inability: Not on file  . Transportation needs:    Medical: Not on file    Non-medical: Not on file  Tobacco Use  . Smoking status: Never Smoker  . Smokeless tobacco: Never Used  Substance and Sexual Activity  . Alcohol use: No    Alcohol/week: 0.0 standard drinks  . Drug use: No  . Sexual activity: Yes  Lifestyle  . Physical activity:    Days per week: Not on file    Minutes per session: Not on file  . Stress: Not on file  Relationships  . Social connections:    Talks on phone: Not on file    Gets together: Not on file    Attends religious service: Not on file    Active member of club or organization: Not on file    Attends meetings of clubs or organizations: Not on file    Relationship status: Not on file  . Intimate partner violence:    Fear of  current or ex partner: Not on file    Emotionally abused: Not on file    Physically abused: Not on file    Forced sexual activity: Not on file  Other Topics Concern  . 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   Patient is a retired Agricultural consultant.   Patient has two children.   Patient has 5 years of [employer].   Patient is right-handed.  Family History  Problem Relation Age of Onset  . Cancer Mother        bladder cancer  . Heart disease Mother        CABG  . Cancer Father        lung cancer after asbestosis  . Hypertension Father   . Diabetes Brother   . Colon cancer Neg Hx      Review of Systems  Constitutional: Positive for fever. Negative for chills and weight loss.  HENT: Positive for sore throat. Negative for ear pain and rhinorrhea.   Respiratory: Positive for cough. Negative for hemoptysis, shortness of breath and wheezing.   Cardiovascular: Negative for chest pain.  Gastrointestinal: Negative for heartburn.  Genitourinary: Negative.   Musculoskeletal: Negative for myalgias.  Skin: Negative for rash.  Neurological: Negative for headaches.   Vitals:   04/20/18 1056  BP: 124/83  Pulse: 83  Resp: 16  Temp: 98.6 F (37 C)  SpO2: 100%     Physical Exam Vitals signs reviewed.  Constitutional:      Appearance: Normal appearance.  HENT:     Head: Normocephalic and atraumatic.     Right Ear: Tympanic membrane, ear canal and external ear normal.     Left Ear: Tympanic membrane, ear canal and external ear normal.     Nose: Congestion present.     Mouth/Throat:     Mouth: Mucous membranes are moist.     Pharynx: Oropharynx is clear.  Eyes:     Extraocular Movements: Extraocular movements intact.     Conjunctiva/sclera: Conjunctivae normal.     Pupils: Pupils are equal, round, and reactive to light.  Neck:     Musculoskeletal: Normal range of motion. No muscular tenderness.  Cardiovascular:      Rate and Rhythm: Normal rate and regular rhythm.     Pulses: Normal pulses.     Heart sounds: Normal heart sounds.  Pulmonary:     Effort: Pulmonary effort is normal.     Breath sounds: Normal breath sounds.  Abdominal:     Palpations: Abdomen is soft.     Tenderness: There is no abdominal tenderness.  Musculoskeletal: Normal range of motion.  Lymphadenopathy:     Cervical: No cervical adenopathy.  Skin:    General: Skin is warm and dry.     Capillary Refill: Capillary refill takes less than 2 seconds.  Neurological:     General: No focal deficit present.     Mental Status: He is alert and oriented to person, place, and time.  Psychiatric:        Mood and Affect: Mood normal.        Behavior: Behavior normal.      ASSESSMENT & PLAN: Demetrias was seen today for head congestion and cough.  Diagnoses and all orders for this visit:  Cough -     benzonatate (TESSALON) 200 MG capsule; Take 1 capsule (200 mg total) by mouth 2 (two) times daily as needed for cough.  Lower respiratory infection -     amoxicillin-clavulanate (AUGMENTIN) 875-125 MG tablet; Take 1 tablet by mouth 2 (two) times daily for 7 days.  Chest congestion -     pseudoephedrine-guaifenesin (MUCINEX D) 60-600 MG 12 hr tablet; Take 1 tablet by mouth every 12 (twelve) hours for 5 days.    Patient Instructions       If you have lab work done today you will be contacted with your lab results within the next 2 weeks.  If you  have not heard from [place] then please contact us. The fastest way to get your results is to register for My Chart.   IF you received an x-ray today, you will receive an invoice from [facility]. Please contact [facility] at [phone] with questions or concerns regarding your invoice.   IF you received labwork today, you will receive an invoice from [facility]. Please contact [facility] at [phone] with questions or concerns regarding your invoice.   Our billing staff  will not be able to assist you with questions regarding bills from these companies.  You will be contacted with the lab results as soon as they are available. The fastest way to get your results is to activate your My Chart account. Instructions are located on the last page of this paperwork. If you have not heard from [place] regarding the results in 2 weeks, please contact this office.     Cough, Adult  A cough helps to clear your throat and lungs. A cough may last only 2-3 weeks (acute), or it may last longer than 8 weeks (chronic). Many different things can cause a cough. A cough may be a sign of an illness or another medical condition. Follow these instructions at home:  Pay attention to any changes in your cough.  Take medicines only as told by your doctor. ? If you were prescribed an antibiotic medicine, take it as told by your doctor. Do not stop taking it even if you start to feel better. ? Talk with your doctor before you try using a cough medicine.  Drink enough fluid to keep your pee (urine) clear or pale yellow.  If the air is dry, use a cold steam vaporizer or humidifier in your home.  Stay away from things that make you cough at work or at home.  If your cough is worse at night, try using extra pillows to raise your head up higher while you sleep.  Do not smoke, and try not to be around smoke. If you need help quitting, ask your doctor.  Do not have caffeine.  Do not drink alcohol.  Rest as needed. Contact a doctor if:  You have new problems (symptoms).  You cough up yellow fluid (pus).  Your cough does not get better after 2-3 weeks, or your cough gets worse.  Medicine does not help your cough and you are not sleeping well.  You have pain that gets worse or pain that is not helped with medicine.  You have a fever.  You are losing weight and you do not know why.  You have night sweats. Get help right away if:  You cough up blood.  You have trouble  breathing.  Your heartbeat is very fast. This information is not intended to replace advice given to you by your health care provider. Make sure you discuss any questions you have with your health care provider. Document Released: 10/30/2010 Document Revised: 07/25/2015 Document Reviewed: 04/25/2014 Elsevier Interactive Patient Education  2019 Elsevier Inc.      Agustina Caroli, MD [facility]

## 2018-04-20 NOTE — Patient Instructions (Addendum)
     If you have lab work done today you will be contacted with your lab results within the next 2 weeks.  If you have not heard from us then please contact us. The fastest way to get your results is to register for My Chart.   IF you received an x-ray today, you will receive an invoice from [facility]. Please contact  Radiology at [phone] with questions or concerns regarding your invoice.   IF you received labwork today, you will receive an invoice from [facility]. Please contact [facility] at [phone] with questions or concerns regarding your invoice.   Our billing staff will not be able to assist you with questions regarding bills from these companies.  You will be contacted with the lab results as soon as they are available. The fastest way to get your results is to activate your My Chart account. Instructions are located on the last page of this paperwork. If you have not heard from us regarding the results in 2 weeks, please contact this office.     Cough, Adult  A cough helps to clear your throat and lungs. A cough may last only 2-3 weeks (acute), or it may last longer than 8 weeks (chronic). Many different things can cause a cough. A cough may be a sign of an illness or another medical condition. Follow these instructions at home:  Pay attention to any changes in your cough.  Take medicines only as told by your doctor. ? If you were prescribed an antibiotic medicine, take it as told by your doctor. Do not stop taking it even if you start to feel better. ? Talk with your doctor before you try using a cough medicine.  Drink enough fluid to keep your pee (urine) clear or pale yellow.  If the air is dry, use a cold steam vaporizer or humidifier in your home.  Stay away from things that make you cough at work or at home.  If your cough is worse at night, try using extra pillows to raise your head up higher while you sleep.  Do not smoke, and try not to  be around smoke. If you need help quitting, ask your doctor.  Do not have caffeine.  Do not drink alcohol.  Rest as needed. Contact a doctor if:  You have new problems (symptoms).  You cough up yellow fluid (pus).  Your cough does not get better after 2-3 weeks, or your cough gets worse.  Medicine does not help your cough and you are not sleeping well.  You have pain that gets worse or pain that is not helped with medicine.  You have a fever.  You are losing weight and you do not know why.  You have night sweats. Get help right away if:  You cough up blood.  You have trouble breathing.  Your heartbeat is very fast. This information is not intended to replace advice given to you by your health care provider. Make sure you discuss any questions you have with your health care provider. Document Released: 10/30/2010 Document Revised: 07/25/2015 Document Reviewed: 04/25/2014 Elsevier Interactive Patient Education  2019 Elsevier Inc.  

## 2018-04-29 ENCOUNTER — Ambulatory Visit (INDEPENDENT_AMBULATORY_CARE_PROVIDER_SITE_OTHER): Payer: BC Managed Care – PPO

## 2018-04-29 ENCOUNTER — Ambulatory Visit: Payer: BC Managed Care – PPO | Admitting: Emergency Medicine

## 2018-04-29 ENCOUNTER — Encounter: Payer: Self-pay | Admitting: Emergency Medicine

## 2018-04-29 ENCOUNTER — Other Ambulatory Visit: Payer: Self-pay

## 2018-04-29 VITALS — BP 121/80 | HR 76 | Temp 98.2°F | Resp 18 | Ht 65.0 in | Wt 201.4 lb

## 2018-04-29 DIAGNOSIS — R05 Cough: Secondary | ICD-10-CM | POA: Diagnosis not present

## 2018-04-29 DIAGNOSIS — J22 Unspecified acute lower respiratory infection: Secondary | ICD-10-CM

## 2018-04-29 DIAGNOSIS — R059 Cough, unspecified: Secondary | ICD-10-CM

## 2018-04-29 MED ORDER — AZITHROMYCIN 250 MG PO TABS: ORAL_TABLET | ORAL | 0 refills | Status: DC

## 2018-04-29 MED ORDER — BENZONATATE 200 MG PO CAPS: 200.0000 mg | ORAL_CAPSULE | Freq: Two times a day (BID) | ORAL | 0 refills | Status: DC | PRN

## 2018-04-29 MED ORDER — PROMETHAZINE-DM 6.25-15 MG/5ML PO SYRP: 5.0000 mL | ORAL_SOLUTION | Freq: Four times a day (QID) | ORAL | 0 refills | Status: DC | PRN

## 2018-04-29 NOTE — Patient Instructions (Addendum)
     If you have lab work done today you will be contacted with your lab results within the next 2 weeks.  If you have not heard from us then please contact us. The fastest way to get your results is to register for My Chart.   IF you received an x-ray today, you will receive an invoice from [facility]. Please contact  Radiology at [phone] with questions or concerns regarding your invoice.   IF you received labwork today, you will receive an invoice from [facility]. Please contact [facility] at [phone] with questions or concerns regarding your invoice.   Our billing staff will not be able to assist you with questions regarding bills from these companies.  You will be contacted with the lab results as soon as they are available. The fastest way to get your results is to activate your My Chart account. Instructions are located on the last page of this paperwork. If you have not heard from us regarding the results in 2 weeks, please contact this office.     Cough, Adult  A cough helps to clear your throat and lungs. A cough may last only 2-3 weeks (acute), or it may last longer than 8 weeks (chronic). Many different things can cause a cough. A cough may be a sign of an illness or another medical condition. Follow these instructions at home:  Pay attention to any changes in your cough.  Take medicines only as told by your doctor. ? If you were prescribed an antibiotic medicine, take it as told by your doctor. Do not stop taking it even if you start to feel better. ? Talk with your doctor before you try using a cough medicine.  Drink enough fluid to keep your pee (urine) clear or pale yellow.  If the air is dry, use a cold steam vaporizer or humidifier in your home.  Stay away from things that make you cough at work or at home.  If your cough is worse at night, try using extra pillows to raise your head up higher while you sleep.  Do not smoke, and try not to  be around smoke. If you need help quitting, ask your doctor.  Do not have caffeine.  Do not drink alcohol.  Rest as needed. Contact a doctor if:  You have new problems (symptoms).  You cough up yellow fluid (pus).  Your cough does not get better after 2-3 weeks, or your cough gets worse.  Medicine does not help your cough and you are not sleeping well.  You have pain that gets worse or pain that is not helped with medicine.  You have a fever.  You are losing weight and you do not know why.  You have night sweats. Get help right away if:  You cough up blood.  You have trouble breathing.  Your heartbeat is very fast. This information is not intended to replace advice given to you by your health care provider. Make sure you discuss any questions you have with your health care provider. Document Released: 10/30/2010 Document Revised: 07/25/2015 Document Reviewed: 04/25/2014 Elsevier Interactive Patient Education  2019 Elsevier Inc.  

## 2018-04-29 NOTE — Progress Notes (Signed)
[place] 63 y.o.   Chief Complaint  Patient presents with  . Cough    and congestion follow up     HISTORY OF PRESENT ILLNESS: This is a 63 y.o. male seen by me on 04/20/2018 for the same.  Persistent symptoms.  Complaining of productive cough and congestion.  No new symptoms.  Finished Augmentin and Tessalon Perles.  No significant improvement.  HPI   Prior to Admission medications   Medication Sig Start Date End Date Taking? Authorizing Provider  B Complex Vitamins (VITAMIN-B COMPLEX PO) Take 1 tablet by mouth daily.   Yes [provider]  benzonatate (TESSALON) 200 MG capsule Take 1 capsule (200 mg total) by mouth 2 (two) times daily as needed for cough. 04/20/18  Yes Safira Proffit, Ines Bloomer, MD  cetirizine (ZYRTEC) 10 MG tablet Take 1 tablet (10 mg total) by mouth daily. 12/30/17  Yes Stallings, Zoe A, MD  clonazePAM (KLONOPIN) 2 MG tablet Take 1.5 tablets (3 mg total) by mouth at bedtime. 04/13/18  Yes Forrest Moron, MD  cyclobenzaprine (FLEXERIL) 10 MG tablet Take 10 mg by mouth 2 (two) times daily at 8 am and 10 pm. Per pt takes 2 tabs at bedtime since he is out of klonopin   Yes [provider]  lisinopril-hydrochlorothiazide (PRINZIDE,ZESTORETIC) 20-12.5 MG tablet Take 1 tablet by mouth  every evening 04/04/18  Yes Stallings, Zoe A, MD  naproxen sodium (ALEVE) 220 MG tablet Take 2 tablets (440 mg total) by mouth 2 (two) times daily with a meal. Patient taking differently: Take 440 mg by mouth 2 (two) times daily.  12/30/17  Yes Forrest Moron, MD  OVER THE COUNTER MEDICATION Take 2 capsules by mouth at bedtime. Physician's Choice Sleep Aid - Vitamin B6 2 mg, Chamomile flowers 100 mg, valerian root 100 mg, Theanine 50 mg, melatonin 5 mg   Yes [provider]  pantoprazole (PROTONIX) 40 MG tablet TAKE 1 TABLET (40 MG TOTAL) BY MOUTH DAILY. 04/04/18  Yes Forrest Moron, MD  Probiotic Product (PROBIOTIC DAILY PO) Take 1 capsule by mouth daily. PB8  Probiotic   Yes [provider]  traMADol (ULTRAM) 50 MG tablet TAKE 1 TABLET (50 MG TOTAL) BY MOUTH 2 (TWO) TIMES DAILY AS NEEDED Patient taking differently: Take 100 mg by mouth 2 (two) times daily as needed (pain).  12/30/17  Yes Forrest Moron, MD  traZODone (DESYREL) 100 MG tablet Take 2 tablets by mouth at  bedtime 04/04/18  Yes Forrest Moron, MD    Allergies  Allergen Reactions  . Prednisone Other (See Comments)    Severe stomach pain  . Codeine Itching  . Hydrocodone Itching    Patient Active Problem List   Diagnosis Date Noted  . Syncope 02/26/2018  . Cough 02/02/2018  . Sinus congestion 02/02/2018  . Acute non-recurrent maxillary sinusitis 02/02/2018  . HTN (hypertension) 03/24/2016  . GERD (gastroesophageal reflux disease)   . Insomnia with sleep apnea 12/06/2013  . OSA on CPAP 12/06/2013  . Insomnia secondary to restless leg syndrome 07/17/2013  . Circadian rhythm sleep disorder, shift work type 07/17/2013  . Osteoarthritis, multiple sites 04/26/2011  . Insomnia 04/26/2011    Past Medical History:  Diagnosis Date  . Allergy    seasonal  . Arthritis    mild  . Cancer ([place])    skin  . Circadian rhythm sleep disorder, shift work type 07/17/2013  . Complication of anesthesia    nausea and vomiting  .  GERD (gastroesophageal reflux disease)   . Hypertension   . Insomnia secondary to restless leg syndrome 07/17/2013  . Kidney stones   . OSA on CPAP   . PONV (postoperative nausea and vomiting)   . Sleep apnea    cpap    Past Surgical History:  Procedure Laterality Date  . acl repair left knee    . BACK SURGERY  2017  . CARDIAC CATHETERIZATION  2005   negative   . CHOLECYSTECTOMY    . CYSTOSCOPY/RETROGRADE/URETEROSCOPY/STONE EXTRACTION WITH BASKET  04/07/2012   Procedure: CYSTOSCOPY/RETROGRADE/URETEROSCOPY/STONE EXTRACTION WITH BASKET;  Surgeon: Malka So, MD;  Location: [facility];  Service: Urology;  Laterality: Bilateral;  cystoscopy, bilateral  stent removal, bilateral ureteroscopy with bilateral stone extraction and insertion of right ureteral stent  . fusion great toe right foot    . ligaments repair left wrist     . ROTATOR CUFF REPAIR     left shoulder   . skin cancer removed from left cheek    . stone extractions and urethral stents     multiple  . tendons cut on left heel    . TONSILLECTOMY    . VARICOCELE EXCISION      Social History   Socioeconomic History  . Marital status: Married    Spouse name: [name]  . Number of children: 2  . Years of education: 12+   . Highest education level: Not on file  Occupational History  . 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 Financial resource strain: Not on file  . Food insecurity:    Worry: Not on file    Inability: Not on file  . Transportation needs:    Medical: Not on file    Non-medical: Not on file  Tobacco Use  . Smoking status: Never Smoker  . Smokeless tobacco: Never Used  Substance and Sexual Activity  . Alcohol use: No    Alcohol/week: 0.0 standard drinks  . Drug use: No  . Sexual activity: Yes  Lifestyle  . Physical activity:    Days per week: Not on file    Minutes per session: Not on file  . Stress: Not on file  Relationships  . Social connections:    Talks on phone: Not on file    Gets together: Not on file    Attends religious service: Not on file    Active member of club or organization: Not on file    Attends meetings of clubs or organizations: Not on file    Relationship status: Not on file  . Intimate partner violence:    Fear of current or ex partner: Not on file    Emotionally abused: Not on file    Physically abused: Not on file    Forced sexual activity: Not on file  Other Topics Concern  . 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   Patient is a retired Agricultural consultant.   Patient has two children.    Patient has 5 years of [employer].   Patient is right-handed.    Family History  Problem Relation Age of Onset  . Cancer Mother        bladder cancer  . Heart disease Mother        CABG  . Cancer Father        lung cancer after asbestosis  .  Hypertension Father   . Diabetes Brother   . Colon cancer Neg Hx      Review of Systems  Constitutional: Negative.  Negative for chills and fever.  HENT: Positive for congestion. Negative for nosebleeds and sore throat.   Eyes: Negative.   Respiratory: Positive for cough and sputum production. Negative for shortness of breath and wheezing.   Cardiovascular: Negative.  Negative for chest pain and palpitations.  Gastrointestinal: Negative.  Negative for abdominal pain, diarrhea, nausea and vomiting.  Genitourinary: Negative.   Musculoskeletal: Negative.   Skin: Negative.  Negative for rash.  Neurological: Negative.  Negative for dizziness and headaches.  Endo/Heme/Allergies: Negative.    Vitals:   04/29/18 1504  BP: 121/80  Pulse: 76  Resp: 18  Temp: 98.2 F (36.8 C)  SpO2: 96%     Physical Exam Vitals signs reviewed.  Constitutional:      Appearance: Normal appearance.  HENT:     Head: Normocephalic and atraumatic.     Nose: Nose normal.     Mouth/Throat:     Mouth: Mucous membranes are moist.     Pharynx: Oropharynx is clear.  Eyes:     Extraocular Movements: Extraocular movements intact.     Conjunctiva/sclera: Conjunctivae normal.     Pupils: Pupils are equal, round, and reactive to light.  Neck:     Musculoskeletal: Normal range of motion and neck supple.  Cardiovascular:     Rate and Rhythm: Normal rate and regular rhythm.     Heart sounds: Normal heart sounds.  Pulmonary:     Effort: Pulmonary effort is normal.     Breath sounds: Normal breath sounds.  Abdominal:     Palpations: Abdomen is soft.     Tenderness: There is no abdominal tenderness.  Musculoskeletal: Normal range of motion.  Skin:     General: Skin is warm and dry.  Neurological:     General: No focal deficit present.     Mental Status: He is alert and oriented to person, place, and time.  Psychiatric:        Mood and Affect: Mood normal.        Behavior: Behavior normal.    Dg Chest 2 View  Result Date: 04/29/2018 CLINICAL DATA:  Cough EXAM: CHEST - 2 VIEW COMPARISON:  02/26/2018 FINDINGS: Heart and mediastinal contours are within normal limits. No focal opacities or effusions. No acute bony abnormality. IMPRESSION: No active cardiopulmonary disease. Electronically Signed   By: Rolm Baptise M.D.   On: 04/29/2018 15:47     ASSESSMENT & PLAN: [place] was seen today for cough.  Diagnoses and all orders for this visit:  Cough -     DG Chest 2 View; Future -     promethazine-dextromethorphan (PROMETHAZINE-DM) 6.25-15 MG/5ML syrup; Take 5 mLs by mouth 4 (four) times daily as needed for cough. -     benzonatate (TESSALON) 200 MG capsule; Take 1 capsule (200 mg total) by mouth 2 (two) times daily as needed for cough.  Lower respiratory infection -     azithromycin (ZITHROMAX) 250 MG tablet; Sig as indicated    Patient Instructions       If you have lab work done today you will be contacted with your lab results within the next 2 weeks.  If you have not heard from [place] then please contact us. The fastest way to get your results is to register for My Chart.   IF you received an x-ray today, you will receive an  invoice from [facility]. Please contact [facility] at [phone] with questions or concerns regarding your invoice.   IF you received labwork today, you will receive an invoice from [facility]. Please contact [facility] at [phone] with questions or concerns regarding your invoice.   Our billing staff will not be able to assist you with questions regarding bills from these companies.  You will be contacted with the lab results as soon as they are available. The fastest way to get your  results is to activate your My Chart account. Instructions are located on the last page of this paperwork. If you have not heard from [place] regarding the results in 2 weeks, please contact this office.     Cough, Adult  A cough helps to clear your throat and lungs. A cough may last only 2-3 weeks (acute), or it may last longer than 8 weeks (chronic). Many different things can cause a cough. A cough may be a sign of an illness or another medical condition. Follow these instructions at home:  Pay attention to any changes in your cough.  Take medicines only as told by your doctor. ? If you were prescribed an antibiotic medicine, take it as told by your doctor. Do not stop taking it even if you start to feel better. ? Talk with your doctor before you try using a cough medicine.  Drink enough fluid to keep your pee (urine) clear or pale yellow.  If the air is dry, use a cold steam vaporizer or humidifier in your home.  Stay away from things that make you cough at work or at home.  If your cough is worse at night, try using extra pillows to raise your head up higher while you sleep.  Do not smoke, and try not to be around smoke. If you need help quitting, ask your doctor.  Do not have caffeine.  Do not drink alcohol.  Rest as needed. Contact a doctor if:  You have new problems (symptoms).  You cough up yellow fluid (pus).  Your cough does not get better after 2-3 weeks, or your cough gets worse.  Medicine does not help your cough and you are not sleeping well.  You have pain that gets worse or pain that is not helped with medicine.  You have a fever.  You are losing weight and you do not know why.  You have night sweats. Get help right away if:  You cough up blood.  You have trouble breathing.  Your heartbeat is very fast. This information is not intended to replace advice given to you by your health care provider. Make sure you discuss any questions you have with your  health care provider. Document Released: 10/30/2010 Document Revised: 07/25/2015 Document Reviewed: 04/25/2014 Elsevier Interactive Patient Education  2019 Elsevier Inc.      Agustina Caroli, MD [facility]

## 2018-06-10 ENCOUNTER — Telehealth: Payer: Self-pay

## 2018-06-10 ENCOUNTER — Other Ambulatory Visit: Payer: Self-pay

## 2018-06-10 ENCOUNTER — Ambulatory Visit (INDEPENDENT_AMBULATORY_CARE_PROVIDER_SITE_OTHER): Payer: BC Managed Care – PPO | Admitting: Family Medicine

## 2018-06-10 DIAGNOSIS — I10 Essential (primary) hypertension: Secondary | ICD-10-CM | POA: Diagnosis not present

## 2018-06-10 NOTE — Telephone Encounter (Signed)
Pt here today for lab visit only and requesting a bp check.  Pt bp 152/92.  Pt is requesting his bp med (lisinopril) be changed as he experiencing the side effect of coughing continuously.  Per pt d'cont bp med x 2 weeks and coughing has stopped.  Notice the coughing stopped within 5 days without it.  I advised I  would send message to provider.   Dgaddy, CMA

## 2018-06-11 LAB — COMPREHENSIVE METABOLIC PANEL
ALT: 22 IU/L (ref 0–44)
AST: 17 IU/L (ref 0–40)
Albumin/Globulin Ratio: 2.3 — ABNORMAL HIGH (ref 1.2–2.2)
Albumin: 4.2 g/dL (ref 3.8–4.8)
Alkaline Phosphatase: 73 IU/L (ref 39–117)
BUN/Creatinine Ratio: 19 (ref 10–24)
BUN: 21 mg/dL (ref 8–27)
Bilirubin Total: 0.6 mg/dL (ref 0.0–1.2)
CO2: 24 mmol/L (ref 20–29)
Calcium: 9 mg/dL (ref 8.6–10.2)
Chloride: 108 mmol/L — ABNORMAL HIGH (ref 96–106)
Creatinine, Ser: 1.11 mg/dL (ref 0.76–1.27)
GFR calc Af Amer: 81 mL/min/{1.73_m2} (ref >59)
GFR calc non Af Amer: 70 mL/min/{1.73_m2} (ref >59)
Globulin, Total: 1.8 g/dL (ref 1.5–4.5)
Glucose: 106 mg/dL — ABNORMAL HIGH (ref 65–99)
Potassium: 4.3 mmol/L (ref 3.5–5.2)
Sodium: 144 mmol/L (ref 134–144)
Total Protein: 6 g/dL (ref 6.0–8.5)

## 2018-06-14 ENCOUNTER — Encounter: Payer: BC Managed Care – PPO | Admitting: Family Medicine

## 2018-06-16 ENCOUNTER — Telehealth (INDEPENDENT_AMBULATORY_CARE_PROVIDER_SITE_OTHER): Payer: BC Managed Care – PPO | Admitting: Family Medicine

## 2018-06-16 ENCOUNTER — Other Ambulatory Visit: Payer: Self-pay

## 2018-06-16 ENCOUNTER — Encounter: Payer: Self-pay | Admitting: Family Medicine

## 2018-06-16 VITALS — BP 129/68 | Ht 65.0 in | Wt 190.0 lb

## 2018-06-16 DIAGNOSIS — I10 Essential (primary) hypertension: Secondary | ICD-10-CM | POA: Diagnosis not present

## 2018-06-16 DIAGNOSIS — Z760 Encounter for issue of repeat prescription: Secondary | ICD-10-CM

## 2018-06-16 DIAGNOSIS — F5101 Primary insomnia: Secondary | ICD-10-CM

## 2018-06-16 DIAGNOSIS — Z13220 Encounter for screening for lipoid disorders: Secondary | ICD-10-CM

## 2018-06-16 DIAGNOSIS — E663 Overweight: Secondary | ICD-10-CM

## 2018-06-16 DIAGNOSIS — R972 Elevated prostate specific antigen [PSA]: Secondary | ICD-10-CM | POA: Diagnosis not present

## 2018-06-16 DIAGNOSIS — F1320 Sedative, hypnotic or anxiolytic dependence, uncomplicated: Secondary | ICD-10-CM

## 2018-06-16 MED ORDER — CLONAZEPAM 2 MG PO TABS: 3.0000 mg | ORAL_TABLET | Freq: Every day | ORAL | 0 refills | Status: DC

## 2018-06-16 MED ORDER — TRAZODONE HCL 100 MG PO TABS: ORAL_TABLET | ORAL | 3 refills | Status: DC

## 2018-06-16 NOTE — Patient Instructions (Signed)
° ° ° °  If you have lab work done today you will be contacted with your lab results within the next 2 weeks.  If you have not heard from us then please contact us. The fastest way to get your results is to register for My Chart. ° ° °IF you received an x-ray today, you will receive an invoice from [facility]. Please contact  Radiology at [phone] with questions or concerns regarding your invoice.  ° °IF you received labwork today, you will receive an invoice from [facility]. Please contact [facility] at [phone] with questions or concerns regarding your invoice.  ° °Our billing staff will not be able to assist you with questions regarding bills from these companies. ° °You will be contacted with the lab results as soon as they are available. The fastest way to get your results is to activate your My Chart account. Instructions are located on the last page of this paperwork. If you have not heard from us regarding the results in 2 weeks, please contact this office. °  ° ° ° °

## 2018-06-16 NOTE — Progress Notes (Signed)
Telemedicine Encounter- SOAP NOTE Established Patient  This telephone encounter was conducted with the patient's (or proxy's) verbal consent via audio telecommunications: yes/no: Yes Patient was instructed to have this encounter in a suitably private space; and to only have persons present to whom they give permission to participate. In addition, patient identity was confirmed by use of name plus two identifiers (DOB and address).  I discussed the limitations, risks, security and privacy concerns of performing an evaluation and management service by telephone and the availability of in person appointments. I also discussed with the patient that there may be a patient responsible charge related to this service. The patient expressed understanding and agreed to proceed.  I spent a total of TIME; 0 MIN TO 60 MIN: 25 minutes talking with the patient or their proxy.  CC: health maintenance, hypertension, elevated PSA  Subjective   Casey [name]. is a 63 y.o. established patient. Telephone visit today for  HPI  Elevated PSA He is on the schedule with Dr. Jeffie Pollock at [facility] He denies LUTS - no leakage of urine, hesitance, difficulty passing urine, groin or low back pain Lab Results  Component Value Date   PSA 5.20 (H) 05/09/2015   PSA 4.57 (H) 05/01/2014   PSA 3.06 03/14/2013   Component     Latest Ref Rng & Units 12/30/2017  Prostate Specific Ag, Serum  0.0 - 4.0 ng/mL 10.3 (H)    Hypertension  He stopped lisinopril/hctz for the past 2.5 weeks He stopped the medication because of dry cough He denies shortness of breath, fevers or chills He had a friend with a similar cough who was told to stop the lisinopril BP Readings from Last 3 Encounters:  06/16/18 129/68  04/29/18 121/80  04/20/18 124/83   Overweight He reports that he has been having dietary modifications  He has cut out sugars and cut down on carbs He has cut out french fries He is using stevia Body mass  index is 31.62 kg/m.  Wt Readings from Last 3 Encounters:  06/16/18 190 lb (86.2 kg)  04/29/18 201 lb 6.4 oz (91.4 kg)  04/20/18 203 lb 9.6 oz (92.4 kg)   Insomnia Benzodiazepine dependence Patient is taking 3 mg of klonopin per night We started a weaning protocol but the pandemic happened and he continued his current dose He also uses trazadone He denies depression  Depression screen [facility] 2/9 06/16/2018 04/29/2018 03/11/2018 03/09/2018 02/02/2018  Decreased Interest 0 0 0 0 0  Down, Depressed, Hopeless 0 0 0 0 0  PHQ - 2 Score 0 0 0 0 0    Patient Active Problem List   Diagnosis Date Noted  . Lower respiratory infection 04/29/2018  . Syncope 02/26/2018  . Cough 02/02/2018  . Sinus congestion 02/02/2018  . Acute non-recurrent maxillary sinusitis 02/02/2018  . HTN (hypertension) 03/24/2016  . GERD (gastroesophageal reflux disease)   . Insomnia with sleep apnea 12/06/2013  . OSA on CPAP 12/06/2013  . Insomnia secondary to restless leg syndrome 07/17/2013  . Circadian rhythm sleep disorder, shift work type 07/17/2013  . Osteoarthritis, multiple sites 04/26/2011  . Insomnia 04/26/2011    Past Medical History:  Diagnosis Date  . Allergy    seasonal  . Arthritis    mild  . Cancer ([place])    skin  . Circadian rhythm sleep disorder, shift work type 07/17/2013  . Complication of anesthesia    nausea and vomiting  . GERD (gastroesophageal reflux disease)   .  Hypertension   . Insomnia secondary to restless leg syndrome 07/17/2013  . Kidney stones   . OSA on CPAP   . PONV (postoperative nausea and vomiting)   . Sleep apnea    cpap    Current Outpatient Medications  Medication Sig Dispense Refill  . B Complex Vitamins (VITAMIN-B COMPLEX PO) Take 1 tablet by mouth daily.    . cetirizine (ZYRTEC) 10 MG tablet Take 1 tablet (10 mg total) by mouth daily. 90 tablet 3  . clonazePAM (KLONOPIN) 2 MG tablet Take 1.5 tablets (3 mg total) by mouth at bedtime. 135 tablet 0  . naproxen  sodium (ALEVE) 220 MG tablet Take 2 tablets (440 mg total) by mouth 2 (two) times daily with a meal. (Patient taking differently: Take 440 mg by mouth 2 (two) times daily. ) 60 tablet 11  . OVER THE COUNTER MEDICATION Take 2 capsules by mouth at bedtime. Physician's Choice Sleep Aid - Vitamin B6 2 mg, Chamomile flowers 100 mg, valerian root 100 mg, Theanine 50 mg, melatonin 5 mg    . pantoprazole (PROTONIX) 40 MG tablet TAKE 1 TABLET (40 MG TOTAL) BY MOUTH DAILY. 90 tablet 0  . Probiotic Product (PROBIOTIC DAILY PO) Take 1 capsule by mouth daily. PB8 Probiotic    . traMADol (ULTRAM) 50 MG tablet TAKE 1 TABLET (50 MG TOTAL) BY MOUTH 2 (TWO) TIMES DAILY AS NEEDED 15 tablet 0  . traZODone (DESYREL) 100 MG tablet Take 2 tablets by mouth at  bedtime 180 tablet 3  . cyclobenzaprine (FLEXERIL) 10 MG tablet Take 10 mg by mouth 2 (two) times daily at 8 am and 10 pm. Per pt takes 2 tabs at bedtime since he is out of klonopin    . promethazine-dextromethorphan (PROMETHAZINE-DM) 6.25-15 MG/5ML syrup Take 5 mLs by mouth 4 (four) times daily as needed for cough. (Patient not taking: Reported on 06/16/2018) 118 mL 0   No current facility-administered medications for this visit.     Allergies  Allergen Reactions  . Prednisone Other (See Comments)    Severe stomach pain  . Codeine Itching  . Hydrocodone Itching    Social History   Socioeconomic History  . Marital status: Married    Spouse name: [name]  . Number of children: 2  . Years of education: 12+   . Highest education level: Not on file  Occupational History  . 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 Financial resource strain: Not on file  . Food insecurity:    Worry: Not on file    Inability: Not on file  . Transportation needs:    Medical: Not on file    Non-medical: Not on file  Tobacco Use  . Smoking status: Never Smoker  . Smokeless tobacco: Never Used   Substance and Sexual Activity  . Alcohol use: No    Alcohol/week: 0.0 standard drinks  . Drug use: No  . Sexual activity: Yes  Lifestyle  . Physical activity:    Days per week: Not on file    Minutes per session: Not on file  . Stress: Not on file  Relationships  . Social connections:    Talks on phone: Not on file    Gets together: Not on file    Attends religious service: Not on file    Active member of club or organization: Not on file    Attends meetings of clubs or organizations: Not  on file    Relationship status: Not on file  . Intimate partner violence:    Fear of current or ex partner: Not on file    Emotionally abused: Not on file    Physically abused: Not on file    Forced sexual activity: Not on file  Other Topics Concern  . 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   Patient is a retired Agricultural consultant.   Patient has two children.   Patient has 5 years of [employer].   Patient is right-handed.    ROS Review of Systems  Constitutional: Negative for activity change, appetite change, chills and fever.  HENT: Negative for congestion, nosebleeds, trouble swallowing and voice change.   Respiratory: Negative for cough, shortness of breath and wheezing.   Gastrointestinal: Negative for diarrhea, nausea and vomiting.  Genitourinary: Negative for difficulty urinating, dysuria, flank pain and hematuria.  Musculoskeletal: Negative for back pain, joint swelling and neck pain.  Neurological: Negative for dizziness, speech difficulty, light-headedness and numbness.  See HPI. All other review of systems negative.   Objective   Vitals as reported by the patient: Today's Vitals   06/16/18 1012  BP: 129/68  Weight: 190 lb (86.2 kg)  Height: 5\' 5"  (1.651 m)   Lab Results  Component Value Date   PSA 5.20 (H) 05/09/2015   PSA 4.57 (H) 05/01/2014   PSA 3.06 03/14/2013    Diagnoses and all orders for this visit:   Essential hypertension- Patient's blood pressure is at goal of 139/89 or less. Condition is stable. Continue current medications and treatment plan. I recommend that you exercise for 30-45 minutes 5 days a week. I also recommend a balanced diet with fruits and vegetables every day, lean meats, and little fried foods. The DASH diet (you can find this online) is a good example of this. Will discontinue lisinopril hctz since pt is doing well off meds  Screening, lipid  Elevated PSA- he was scheduled with urology but his appointment was moved due to the pandemic, he has no LUTS  Encounter for medication refill  Primary insomnia- continue trazadone Benzodiazepine dependence ([place])- will continue present dose   Overweight- much improved, with continued weight loss patient is expected to continue to decrase his heart disease risks  Other orders -     clonazePAM (KLONOPIN) 2 MG tablet; Take 1.5 tablets (3 mg total) by mouth at bedtime. -     traZODone (DESYREL) 100 MG tablet; Take 2 tablets by mouth at  bedtime     I discussed the assessment and treatment plan with the patient. The patient was provided an opportunity to ask questions and all were answered. The patient agreed with the plan and demonstrated an understanding of the instructions.   The patient was advised to call back or seek an in-person evaluation if the symptoms worsen or if the condition fails to improve as anticipated.  I provided 25 minutes of non-face-to-face time during this encounter.  Forrest Moron, MD  Primary Care at [facility], [place]

## 2018-06-16 NOTE — Progress Notes (Signed)
CC: complete physical.  Pt would like to discuss changing lisinopril-hctz not controlling bp's.  Pt would like refill on klonopin, trazadone 90 days supply.

## 2018-06-28 ENCOUNTER — Other Ambulatory Visit: Payer: Self-pay | Admitting: Family Medicine

## 2018-08-05 ENCOUNTER — Telehealth: Payer: Self-pay | Admitting: Family Medicine

## 2018-08-05 NOTE — Telephone Encounter (Signed)
Copied from [place] [id number]. Topic: General - Other >> Aug 04, 2018  3:53 PM Percell Belt A wrote: Reason for CRM: pt called in and state that he was just see and was told if bp meds was not helping to call back in and Dr Nolon Rod would put him on something else.  He stated it is still staying kind of high and would like to try a different med   Pharmacy -[id number] - [place], [place] [phone] (Phone)  Best number for pt - [phone]

## 2018-08-05 NOTE — Telephone Encounter (Signed)
Pt called back in to check the status of this message   Best number [phone]

## 2018-08-05 NOTE — Telephone Encounter (Signed)
Please advise on pt call. Pt feels that medication still has not helped his BP. He is requesting alternative

## 2018-08-05 NOTE — Telephone Encounter (Signed)
Pt states he would like to try something different for his B/P, is there anything different for B/P you would like me to sent into his pharmacy for him to try? Please advise

## 2018-08-08 NOTE — Telephone Encounter (Signed)
Can you make him an appointment for a nurse visit to check his bp please?  Then let me know in real time so I can adjust his medication.  Thank you.

## 2018-08-09 ENCOUNTER — Other Ambulatory Visit: Payer: Self-pay

## 2018-08-09 ENCOUNTER — Encounter: Payer: Self-pay | Admitting: Family Medicine

## 2018-08-09 ENCOUNTER — Ambulatory Visit (INDEPENDENT_AMBULATORY_CARE_PROVIDER_SITE_OTHER): Payer: BC Managed Care – PPO | Admitting: Family Medicine

## 2018-08-09 VITALS — BP 142/82

## 2018-08-09 DIAGNOSIS — I10 Essential (primary) hypertension: Secondary | ICD-10-CM

## 2018-08-09 NOTE — Patient Instructions (Signed)
° ° ° °  If you have lab work done today you will be contacted with your lab results within the next 2 weeks.  If you have not heard from us then please contact us. The fastest way to get your results is to register for My Chart. ° ° °IF you received an x-ray today, you will receive an invoice from [facility]. Please contact  Radiology at [phone] with questions or concerns regarding your invoice.  ° °IF you received labwork today, you will receive an invoice from [facility]. Please contact [facility] at [phone] with questions or concerns regarding your invoice.  ° °Our billing staff will not be able to assist you with questions regarding bills from these companies. ° °You will be contacted with the lab results as soon as they are available. The fastest way to get your results is to activate your My Chart account. Instructions are located on the last page of this paperwork. If you have not heard from us regarding the results in 2 weeks, please contact this office. °  ° ° ° °

## 2018-08-09 NOTE — Telephone Encounter (Signed)
SCHEDULED PATIENT.

## 2018-09-05 ENCOUNTER — Telehealth: Payer: Self-pay | Admitting: Family Medicine

## 2018-09-05 ENCOUNTER — Ambulatory Visit: Payer: Self-pay

## 2018-09-05 NOTE — Telephone Encounter (Signed)
Patient called and says since Saturday, he's been having a cough, he broke out in a sweat, feeling tired. He says the cough is not that bad right now, but says at times it's bad, a dry cough. He says not this past Sunday, but Sunday before on 08/28/18, he was told someone from the church was positive for covid, so he says he's not sure if he was exposed or not. He says he's been isolating himself this weekend. He denies SOB, denies fever. He asked about covid testing. I advised he will need a virtual visit with a provider and then they will refer him for testing. I advised to call back tomorrow morning at 0800 and let the agent know that I advised him to call back to have a virtual visit scheduled, so that he can have a covid test done, care advice given, he verbalized understanding.  Reason for Disposition . [1] COVID-19 infection suspected by caller or triager AND [2] mild symptoms (cough, fever, or others) AND [6] no complications or SOB  Answer Assessment - Initial Assessment Questions 1. COVID-19 DIAGNOSIS: "Who made your Coronavirus (COVID-19) diagnosis?" "Was it confirmed by a positive lab test?" If not diagnosed by a HCP, ask "Are there lots of cases (community spread) where you live?" (See public health department website, if unsure)     Not tested 2. ONSET: "When did the COVID-19 symptoms start?"      Saturday 3. WORST SYMPTOM: "What is your worst symptom?" (e.g., cough, fever, shortness of breath, muscle aches)     Cough 4. COUGH: "Do you have a cough?" If so, ask: "How bad is the cough?"       Yes, not real bad, but bad at times 5. FEVER: "Do you have a fever?" If so, ask: "What is your temperature, how was it measured, and when did it start?"     Low grade on Saturday 6. RESPIRATORY STATUS: "Describe your breathing?" (e.g., shortness of breath, wheezing, unable to speak)      No 7. BETTER-SAME-WORSE: "Are you getting better, staying the same or getting worse compared to yesterday?"  If  getting worse, ask, "In what way?"     Same 8. HIGH RISK DISEASE: "Do you have any chronic medical problems?" (e.g., asthma, heart or lung disease, weak immune system, etc.)     No 9. PREGNANCY: "Is there any chance you are pregnant?" "When was your last menstrual period?"     N/A 10. OTHER SYMPTOMS: "Do you have any other symptoms?"  (e.g., chills, fatigue, headache, loss of smell or taste, muscle pain, sore throat)      Fatigue  Protocols used: CORONAVIRUS (COVID-19) DIAGNOSED OR SUSPECTED-A-AH

## 2018-09-06 ENCOUNTER — Telehealth: Payer: Self-pay | Admitting: *Deleted

## 2018-09-06 ENCOUNTER — Other Ambulatory Visit: Payer: Self-pay

## 2018-09-06 ENCOUNTER — Encounter: Payer: Self-pay | Admitting: Emergency Medicine

## 2018-09-06 ENCOUNTER — Telehealth (INDEPENDENT_AMBULATORY_CARE_PROVIDER_SITE_OTHER): Payer: BC Managed Care – PPO | Admitting: Emergency Medicine

## 2018-09-06 VITALS — Ht 65.0 in | Wt 210.0 lb

## 2018-09-06 DIAGNOSIS — R05 Cough: Secondary | ICD-10-CM | POA: Diagnosis not present

## 2018-09-06 DIAGNOSIS — R6889 Other general symptoms and signs: Secondary | ICD-10-CM

## 2018-09-06 DIAGNOSIS — Z20822 Contact with and (suspected) exposure to covid-19: Secondary | ICD-10-CM

## 2018-09-06 DIAGNOSIS — R059 Cough, unspecified: Secondary | ICD-10-CM

## 2018-09-06 MED ORDER — PROMETHAZINE-DM 6.25-15 MG/5ML PO SYRP: 5.0000 mL | ORAL_SOLUTION | Freq: Four times a day (QID) | ORAL | 0 refills | Status: DC | PRN

## 2018-09-06 MED ORDER — AZITHROMYCIN 250 MG PO TABS: ORAL_TABLET | ORAL | 0 refills | Status: DC

## 2018-09-06 NOTE — Progress Notes (Signed)
Called patient to triage for appointment. Patient is complaining of a cough that is dry, body aches and sore throat since Saturday. Patient states on Saturday evening during grilling about 6:00 out he felt feverish. Patient states he has isolated himself in another room from his wife and uses a mask when he goes to the bathroom.

## 2018-09-06 NOTE — Telephone Encounter (Signed)
Spoke with patient, scheduled him for COVID 19 test tomorrow at 9:45 am at [facility].  Testing protocol reviewed with patient.

## 2018-09-06 NOTE — Progress Notes (Signed)
Telemedicine Encounter- SOAP NOTE Established Patient  This telephone encounter was conducted with the patient's (or proxy's) verbal consent via audio telecommunications: yes/no: Yes Patient was instructed to have this encounter in a suitably private space; and to only have persons present to whom they give permission to participate. In addition, patient identity was confirmed by use of name plus two identifiers (DOB and address).  I discussed the limitations, risks, security and privacy concerns of performing an evaluation and management service by telephone and the availability of in person appointments. I also discussed with the patient that there may be a patient responsible charge related to this service. The patient expressed understanding and agreed to proceed.  I spent a total of TIME; 0 MIN TO 60 MIN: 15 minutes talking with the patient or their proxy.  No chief complaint on file. Flu like symptoms  Subjective   Casey [name]. is a 63 y.o. male established patient. Telephone visit today complaining of flu like symptoms that started 3 days ago.  Acute onset of feeling warm and hot, dry cough, feverish, body aches, and sore throat progressively getting worse.  Denies difficulty breathing but mild intermittent wheezing.  Has been taking Aleve and cough medicine with some relief.  No one else sick at home.  Denies any other significant symptoms.  HPI   Patient Active Problem List   Diagnosis Date Noted  . HTN (hypertension) 03/24/2016  . GERD (gastroesophageal reflux disease)   . Insomnia with sleep apnea 12/06/2013  . OSA on CPAP 12/06/2013  . Insomnia secondary to restless leg syndrome 07/17/2013  . Circadian rhythm sleep disorder, shift work type 07/17/2013  . Osteoarthritis, multiple sites 04/26/2011  . Insomnia 04/26/2011    Past Medical History:  Diagnosis Date  . Allergy    seasonal  . Arthritis    mild  . Cancer ([place])    skin  . Circadian rhythm sleep  disorder, shift work type 07/17/2013  . Complication of anesthesia    nausea and vomiting  . GERD (gastroesophageal reflux disease)   . Hypertension   . Insomnia secondary to restless leg syndrome 07/17/2013  . Kidney stones   . OSA on CPAP   . PONV (postoperative nausea and vomiting)   . Sleep apnea    cpap    Current Outpatient Medications  Medication Sig Dispense Refill  . B Complex Vitamins (VITAMIN-B COMPLEX PO) Take 1 tablet by mouth daily.    . cetirizine (ZYRTEC) 10 MG tablet Take 1 tablet (10 mg total) by mouth daily. 90 tablet 3  . clonazePAM (KLONOPIN) 2 MG tablet Take 1.5 tablets (3 mg total) by mouth at bedtime. 135 tablet 0  . naproxen sodium (ALEVE) 220 MG tablet Take 2 tablets (440 mg total) by mouth 2 (two) times daily with a meal. (Patient taking differently: Take 440 mg by mouth 2 (two) times daily. ) 60 tablet 11  . pantoprazole (PROTONIX) 40 MG tablet TAKE 1 TABLET BY MOUTH EVERY DAY 90 tablet 0  . Probiotic Product (PROBIOTIC DAILY PO) Take 1 capsule by mouth daily. PB8 Probiotic    . traZODone (DESYREL) 100 MG tablet Take 2 tablets by mouth at  bedtime 180 tablet 3  . cyclobenzaprine (FLEXERIL) 10 MG tablet Take 10 mg by mouth 2 (two) times daily at 8 am and 10 pm. Per pt takes 2 tabs at bedtime since he is out of klonopin    . OVER THE COUNTER MEDICATION Take 2 capsules by mouth  at bedtime. Physician's Choice Sleep Aid - Vitamin B6 2 mg, Chamomile flowers 100 mg, valerian root 100 mg, Theanine 50 mg, melatonin 5 mg    . promethazine-dextromethorphan (PROMETHAZINE-DM) 6.25-15 MG/5ML syrup Take 5 mLs by mouth 4 (four) times daily as needed for cough. (Patient not taking: Reported on 09/06/2018) 118 mL 0  . traMADol (ULTRAM) 50 MG tablet TAKE 1 TABLET (50 MG TOTAL) BY MOUTH 2 (TWO) TIMES DAILY AS NEEDED (Patient not taking: Reported on 09/06/2018) 15 tablet 0   No current facility-administered medications for this visit.     Allergies  Allergen Reactions  . Prednisone  Other (See Comments)    Severe stomach pain  . Codeine Itching  . Hydrocodone Itching    Social History   Socioeconomic History  . Marital status: Married    Spouse name: [name]  . Number of children: 2  . Years of education: 12+   . Highest education level: Not on file  Occupational History  . 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 Financial resource strain: Not on file  . Food insecurity    Worry: Not on file    Inability: Not on file  . Transportation needs    Medical: Not on file    Non-medical: Not on file  Tobacco Use  . Smoking status: Never Smoker  . Smokeless tobacco: Never Used  Substance and Sexual Activity  . Alcohol use: No    Alcohol/week: 0.0 standard drinks  . Drug use: No  . Sexual activity: Yes  Lifestyle  . Physical activity    Days per week: Not on file    Minutes per session: Not on file  . Stress: Not on file  Relationships  . Social Herbalist on phone: Not on file    Gets together: Not on file    Attends religious service: Not on file    Active member of club or organization: Not on file    Attends meetings of clubs or organizations: Not on file    Relationship status: Not on file  . Intimate partner violence    Fear of current or ex partner: Not on file    Emotionally abused: Not on file    Physically abused: Not on file    Forced sexual activity: Not on file  Other Topics Concern  . 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   Patient is a retired Agricultural consultant.   Patient has two children.   Patient has 5 years of [employer].   Patient is right-handed.    Review of Systems  Constitutional: Positive for fever. Negative for chills.  HENT: Positive for congestion and sore throat. Negative for nosebleeds.   Eyes: Negative.   Respiratory: Positive for cough, sputum production and wheezing.  Negative for shortness of breath.   Cardiovascular: Negative.  Negative for chest pain and palpitations.  Gastrointestinal: Negative.  Negative for abdominal pain, diarrhea, nausea and vomiting.  Genitourinary: Negative.  Negative for dysuria and hematuria.  Musculoskeletal: Positive for myalgias. Negative for back pain and joint pain.  Skin: Negative for rash.  Neurological: Negative for dizziness and headaches.  All other systems reviewed and are negative.   Objective   Vitals as reported by the patient: Today's Vitals   09/06/18 1018  Weight: 210 lb (95.3 kg)  Height: 5'  5" (1.651 m)  Alert and oriented x3 in no apparent respiratory distress.  There are no diagnoses linked to this encounter. Diagnoses and all orders for this visit:  Cough -     promethazine-dextromethorphan (PROMETHAZINE-DM) 6.25-15 MG/5ML syrup; Take 5 mLs by mouth 4 (four) times daily as needed for cough.  Flu-like symptoms  Suspected Covid-19 Virus Infection  Other orders -     azithromycin (ZITHROMAX) 250 MG tablet; Sig as indicated   COVID-19 testing requested.  I discussed the assessment and treatment plan with the patient. The patient was provided an opportunity to ask questions and all were answered. The patient agreed with the plan and demonstrated an understanding of the instructions.   The patient was advised to call back or seek an in-person evaluation if the symptoms worsen or if the condition fails to improve as anticipated.  I provided 15 minutes of non-face-to-face time during this encounter.  Horald Pollen, MD  Primary Care at [facility]

## 2018-09-06 NOTE — Telephone Encounter (Signed)
-----   Message from [facility] MD sent at 09/06/2018 10:47 AM EDT ----- Flu like symptoms progressively getting worse over the past 3 days. Suspected COVID infection.  Medium risk patient.

## 2018-09-07 ENCOUNTER — Other Ambulatory Visit: Payer: Self-pay

## 2018-09-07 DIAGNOSIS — Z8616 Personal history of COVID-19: Secondary | ICD-10-CM

## 2018-09-07 DIAGNOSIS — Z20822 Contact with and (suspected) exposure to covid-19: Secondary | ICD-10-CM

## 2018-09-07 HISTORY — DX: Personal history of COVID-19: Z86.16

## 2018-09-11 LAB — NOVEL CORONAVIRUS, NAA: SARS-CoV-2, NAA: DETECTED — AB

## 2018-09-13 ENCOUNTER — Encounter: Payer: Self-pay | Admitting: Emergency Medicine

## 2018-09-14 ENCOUNTER — Telehealth: Payer: Self-pay | Admitting: Family Medicine

## 2018-09-14 NOTE — Telephone Encounter (Signed)
Pt. called to inquire about getting a call back from his PCP and the Health Dept., with further recommendations, since he tested positive with COVID.  Stated he rec'd notification on Monday that he was positive, and that his PCP office would be contacting him.  Voiced some frustration that he has not been able to get through, on phone, to speak with anyone at the doctor's office.  Asked pt. how he was feeling?  Reported he is starting to feel better.  Reported he had onset of symptoms on 09/03/18.  Reported he never really had a fever.  Denied cough, shortness of breath, or chest discomfort at this time.  Advised on CDC guidelines to quarantine x 10 days from onset of symptoms, and must be fever-free x 3 days, without use of any fever-reducing medications, and must have improvement in respiratory symptoms.  Requesting call back from his PCP office. Also questioned why he has not heard from Health Dept.?  Advised that the PCP office is to notify the Health Dept., and unsure if this has been done.  Advised will send this message to the PCP office and request that he be called back.  Agreed with plan.

## 2018-09-15 ENCOUNTER — Encounter: Payer: Self-pay | Admitting: Emergency Medicine

## 2018-09-16 NOTE — Telephone Encounter (Signed)
Spoke with pt and informed him of some additional recommendations, he verbalized understanding. Advised pt that if he has anymore concerns just give [place] a call here at the office.

## 2018-09-20 ENCOUNTER — Other Ambulatory Visit: Payer: Self-pay | Admitting: Family Medicine

## 2018-09-27 ENCOUNTER — Other Ambulatory Visit: Payer: Self-pay | Admitting: Family Medicine

## 2018-09-27 NOTE — Telephone Encounter (Signed)
Requested medications are due for refill today?  Yes  Requested medications are on the active medication list?  Yes  Last refill-06/16/2018  Future visit scheduled?  No  Notes to clinic   Requested Prescriptions  Pending Prescriptions Disp Refills   clonazePAM (KLONOPIN) 2 MG tablet [Pharmacy Med Name: CLONAZEPAM 2 MG TABLET] 135 tablet 0    Sig: Take 1.5 tablets (3 mg total) by mouth at bedtime.     Not Delegated - Psychiatry:  Anxiolytics/Hypnotics Failed - 09/27/2018 10:37 AM      Failed - This refill cannot be delegated      Failed - Urine Drug Screen completed in last 360 days.      Failed - Valid encounter within last 6 months    Recent Outpatient Visits          1 month ago Essential hypertension   Primary Care at [facility], Arlie Solomons, MD   3 months ago Essential hypertension   Primary Care at [facility], Arlie Solomons, MD   5 months ago Cough   Primary Care at [facility], Ines Bloomer, MD   5 months ago Cough   Primary Care at [facility], Ines Bloomer, MD   6 months ago Encounter for commercial driver medical examination (CDME)   Primary Care at [facility], Ranell Patrick, MD

## 2018-12-09 ENCOUNTER — Other Ambulatory Visit: Payer: Self-pay

## 2018-12-09 ENCOUNTER — Telehealth (INDEPENDENT_AMBULATORY_CARE_PROVIDER_SITE_OTHER): Payer: BC Managed Care – PPO | Admitting: Family Medicine

## 2018-12-09 DIAGNOSIS — J069 Acute upper respiratory infection, unspecified: Secondary | ICD-10-CM | POA: Diagnosis not present

## 2018-12-09 MED ORDER — FLUTICASONE PROPIONATE 50 MCG/ACT NA SUSP: 1.0000 | Freq: Two times a day (BID) | NASAL | 6 refills | Status: DC

## 2018-12-09 MED ORDER — BENZONATATE 100 MG PO CAPS: 100.0000 mg | ORAL_CAPSULE | Freq: Three times a day (TID) | ORAL | 0 refills | Status: DC | PRN

## 2018-12-09 MED ORDER — PROMETHAZINE-DM 6.25-15 MG/5ML PO SYRP: 5.0000 mL | ORAL_SOLUTION | Freq: Four times a day (QID) | ORAL | 0 refills | Status: DC | PRN

## 2018-12-09 NOTE — Progress Notes (Signed)
Virtual Visit Note  I connected with patient on 12/09/18 at 433pm by phone and verified that I am speaking with the correct person using two identifiers. Casey [name] Sr. is currently located at home and patient is currently with them during visit. The provider, Rutherford Guys, MD is located in their office at time of visit.  I discussed the limitations, risks, security and privacy concerns of performing an evaluation and management service by telephone and the availability of in person appointments. I also discussed with the patient that there may be a patient responsible charge related to this service. The patient expressed understanding and agreed to proceed.   CC: cough and congestion  HPI ? Has been having runny nose for past several days  Started clear and now getting thicker and yellowish Having cough, but no SOB Mild sinus pressure with slight headache No ear pain or sore throat No fever or chills Having itchy and watery eyes Feels like sinus infection he has had before, last one feb Has been taking zyrtec, mucinex DM, tussin, tessalon pearls, promethazine Congestion is better but cough was bad this morning Has been drinking fluids Tested positive for covid in July 2020  Allergies  Allergen Reactions  . Prednisone Other (See Comments)    Severe stomach pain  . Codeine Itching  . Hydrocodone Itching    Prior to Admission medications   Medication Sig Start Date End Date Taking? Authorizing Provider  B Complex Vitamins (VITAMIN-B COMPLEX PO) Take 1 tablet by mouth daily.   Yes [provider]  cetirizine (ZYRTEC) 10 MG tablet Take 1 tablet (10 mg total) by mouth daily. 12/30/17  Yes Stallings, Zoe A, MD  clonazePAM (KLONOPIN) 2 MG tablet TAKE 1.5 TABLETS (3 MG TOTAL) BY MOUTH AT BEDTIME. 09/28/18  Yes Stallings, Zoe A, MD  naproxen sodium (ALEVE) 220 MG tablet Take 2 tablets (440 mg total) by mouth 2 (two) times daily with a meal. Patient taking differently:  Take 440 mg by mouth 2 (two) times daily.  12/30/17  Yes Forrest Moron, MD  OVER THE COUNTER MEDICATION Take 2 capsules by mouth at bedtime. Physician's Choice Sleep Aid - Vitamin B6 2 mg, Chamomile flowers 100 mg, valerian root 100 mg, Theanine 50 mg, melatonin 5 mg   Yes [provider]  pantoprazole (PROTONIX) 40 MG tablet TAKE 1 TABLET BY MOUTH EVERY DAY 09/20/18  Yes Forrest Moron, MD  Probiotic Product (PROBIOTIC DAILY PO) Take 1 capsule by mouth daily. PB8 Probiotic   Yes [provider]  promethazine-dextromethorphan (PROMETHAZINE-DM) 6.25-15 MG/5ML syrup Take 5 mLs by mouth 4 (four) times daily as needed for cough. 04/29/18  Yes Sagardia, Ines Bloomer, MD  traMADol (ULTRAM) 50 MG tablet TAKE 1 TABLET (50 MG TOTAL) BY MOUTH 2 (TWO) TIMES DAILY AS NEEDED 12/30/17  Yes Delia Chimes A, MD  traZODone (DESYREL) 100 MG tablet Take 2 tablets by mouth at  bedtime 06/16/18  Yes Forrest Moron, MD    Past Medical History:  Diagnosis Date  . Allergy    seasonal  . Arthritis    mild  . Cancer ([place])    skin  . Circadian rhythm sleep disorder, shift work type 07/17/2013  . Complication of anesthesia    nausea and vomiting  . GERD (gastroesophageal reflux disease)   . Hypertension   . Insomnia secondary to restless leg syndrome 07/17/2013  . Kidney stones   . OSA on CPAP   . PONV (postoperative nausea and  vomiting)   . Sleep apnea    cpap    Past Surgical History:  Procedure Laterality Date  . acl repair left knee    . BACK SURGERY  2017  . CARDIAC CATHETERIZATION  2005   negative   . CHOLECYSTECTOMY    . CYSTOSCOPY/RETROGRADE/URETEROSCOPY/STONE EXTRACTION WITH BASKET  04/07/2012   Procedure: CYSTOSCOPY/RETROGRADE/URETEROSCOPY/STONE EXTRACTION WITH BASKET;  Surgeon: Malka So, MD;  Location: [facility];  Service: Urology;  Laterality: Bilateral;  cystoscopy, bilateral stent removal, bilateral ureteroscopy with bilateral stone extraction and insertion of right  ureteral stent  . fusion great toe right foot    . ligaments repair left wrist     . ROTATOR CUFF REPAIR     left shoulder   . skin cancer removed from left cheek    . stone extractions and urethral stents     multiple  . tendons cut on left heel    . TONSILLECTOMY    . VARICOCELE EXCISION      Social History   Tobacco Use  . Smoking status: Never Smoker  . Smokeless tobacco: Never Used  Substance Use Topics  . Alcohol use: No    Alcohol/week: 0.0 standard drinks    Family History  Problem Relation Age of Onset  . Cancer Mother        bladder cancer  . Heart disease Mother        CABG  . Cancer Father        lung cancer after asbestosis  . Hypertension Father   . Diabetes Brother   . Colon cancer Neg Hx     ROS Per hpi  Objective  Vitals as reported by the patient: none   ASSESSMENT and PLAN  1. URI with cough and congestion Discussed supportive measures for URI: increase hydration, rest, etc. New meds r/se/b reviewed. RTC precautions discussed. - promethazine-dextromethorphan (PROMETHAZINE-DM) 6.25-15 MG/5ML syrup; Take 5 mLs by mouth 4 (four) times daily as needed for cough.  Other orders - benzonatate (TESSALON) 100 MG capsule; Take 1-2 capsules (100-200 mg total) by mouth 3 (three) times daily as needed for cough. - fluticasone (FLONASE) 50 MCG/ACT nasal spray; Place 1 spray into both nostrils 2 (two) times daily.  FOLLOW-UP: prn   The above assessment and management plan was discussed with the patient. The patient verbalized understanding of and has agreed to the management plan. Patient is aware to call the clinic if symptoms persist or worsen. Patient is aware when to return to the clinic for a follow-up visit. Patient educated on when it is appropriate to go to the emergency department.    I provided 10 minutes of non-face-to-face time during this encounter.  Rutherford Guys, MD Primary Care at [facility], [place] [address] Ph.   [phone] Fax [phone]

## 2018-12-09 NOTE — Progress Notes (Signed)
Pt is having productive cough, using mucinex, dm cough syrup, and  leftover promethazine for the sx. Medication and pharmacy verified.

## 2018-12-15 ENCOUNTER — Telehealth: Payer: Self-pay | Admitting: Family Medicine

## 2018-12-15 DIAGNOSIS — J069 Acute upper respiratory infection, unspecified: Secondary | ICD-10-CM

## 2018-12-15 IMAGING — DX DG KNEE COMPLETE 4+V*L*
4 series · 4 of 4 positions shown · non-contrast
Comparison: 01/29/2013.

CLINICAL DATA: left knee discomfort.

EXAM:
LEFT KNEE - COMPLETE 4+ VIEW

[knee ap]
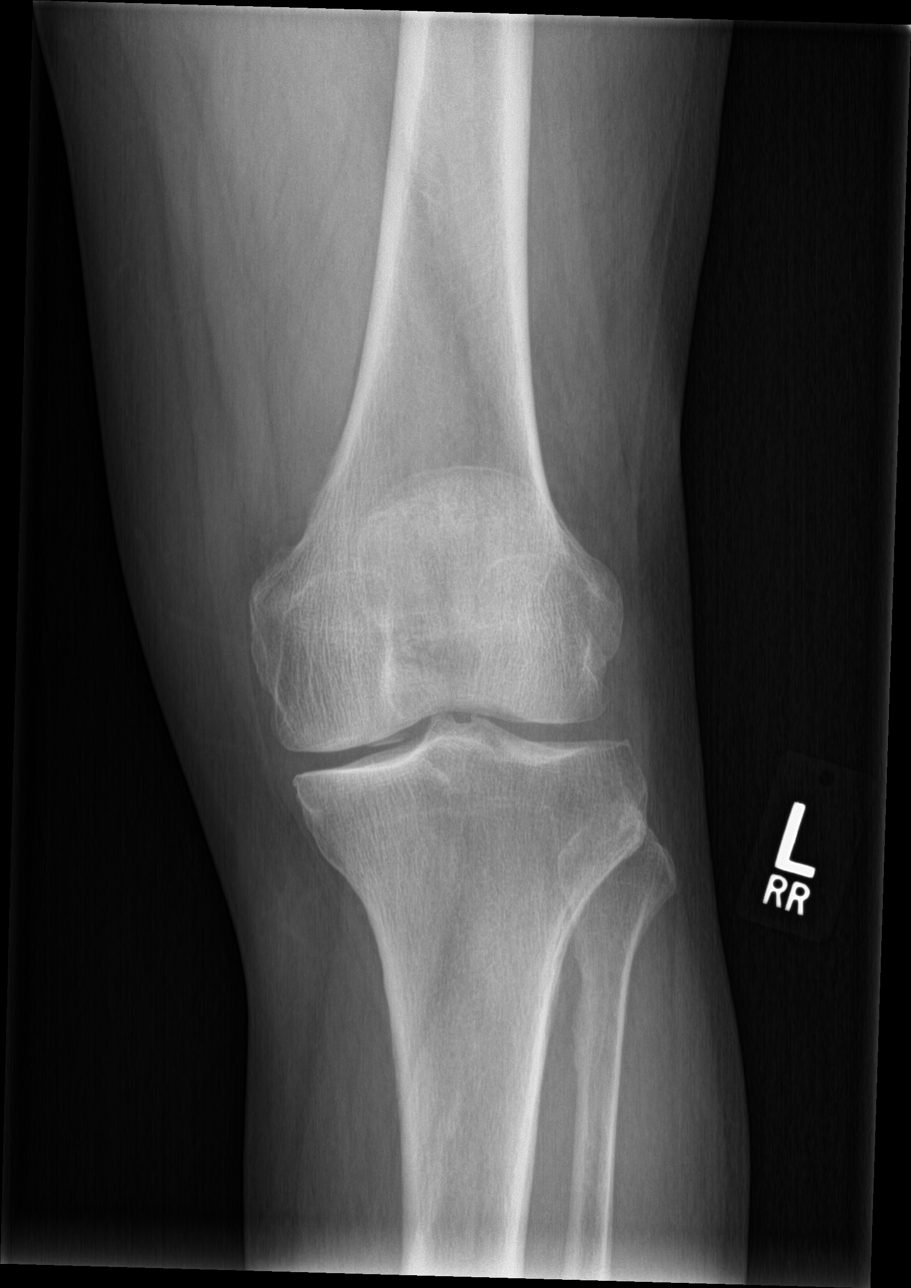

[knee obl (1 of 2)]
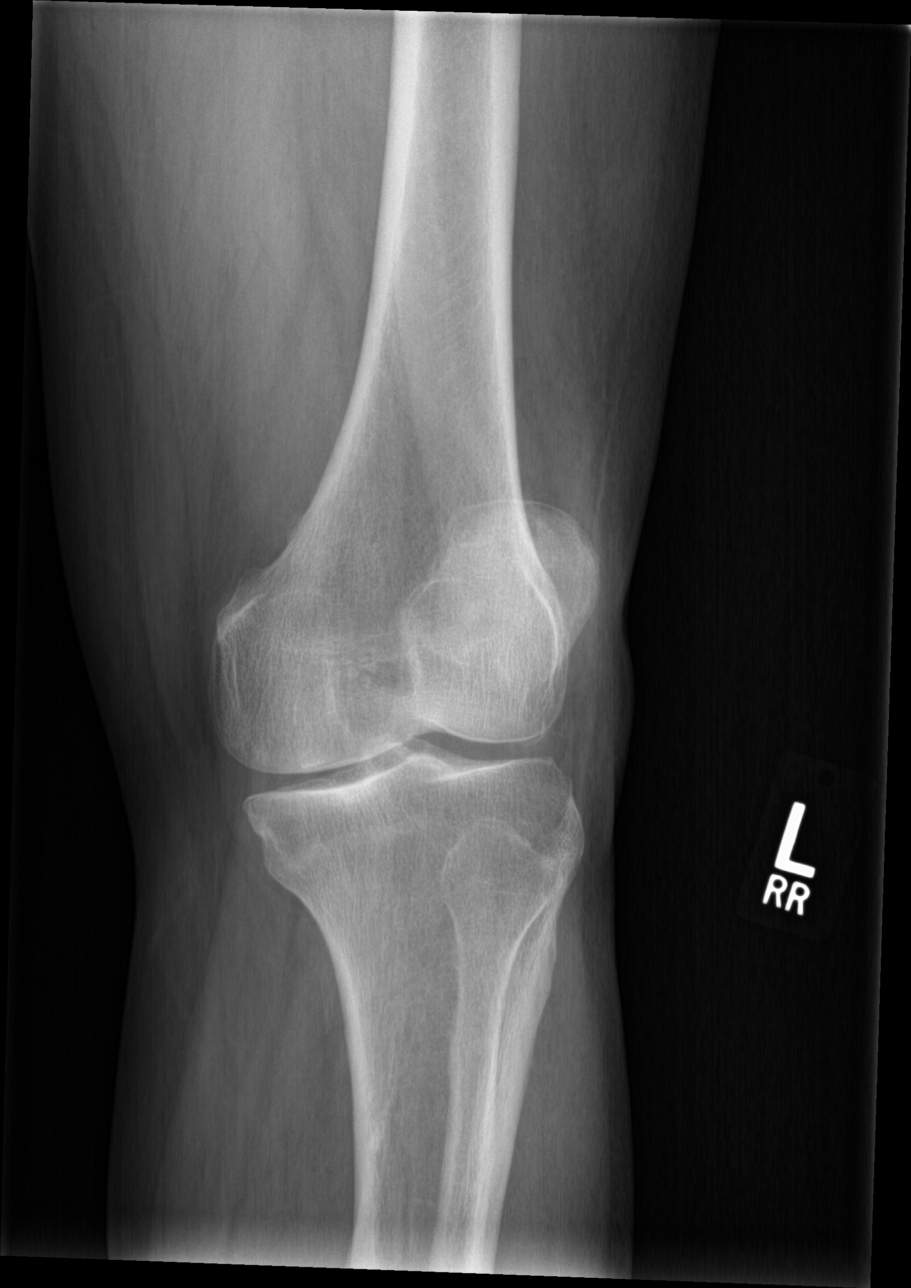

[knee obl (2 of 2)]
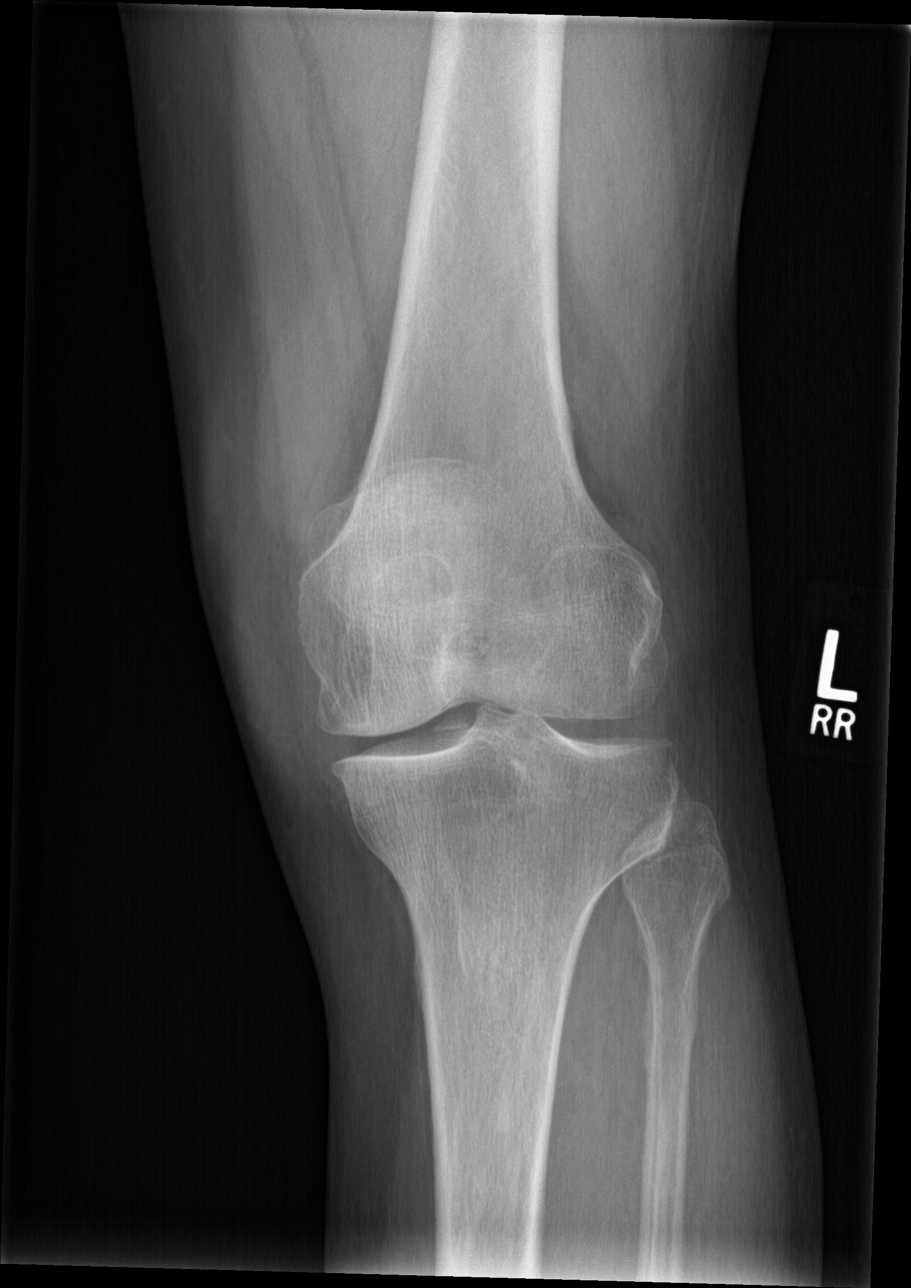

[knee lat]
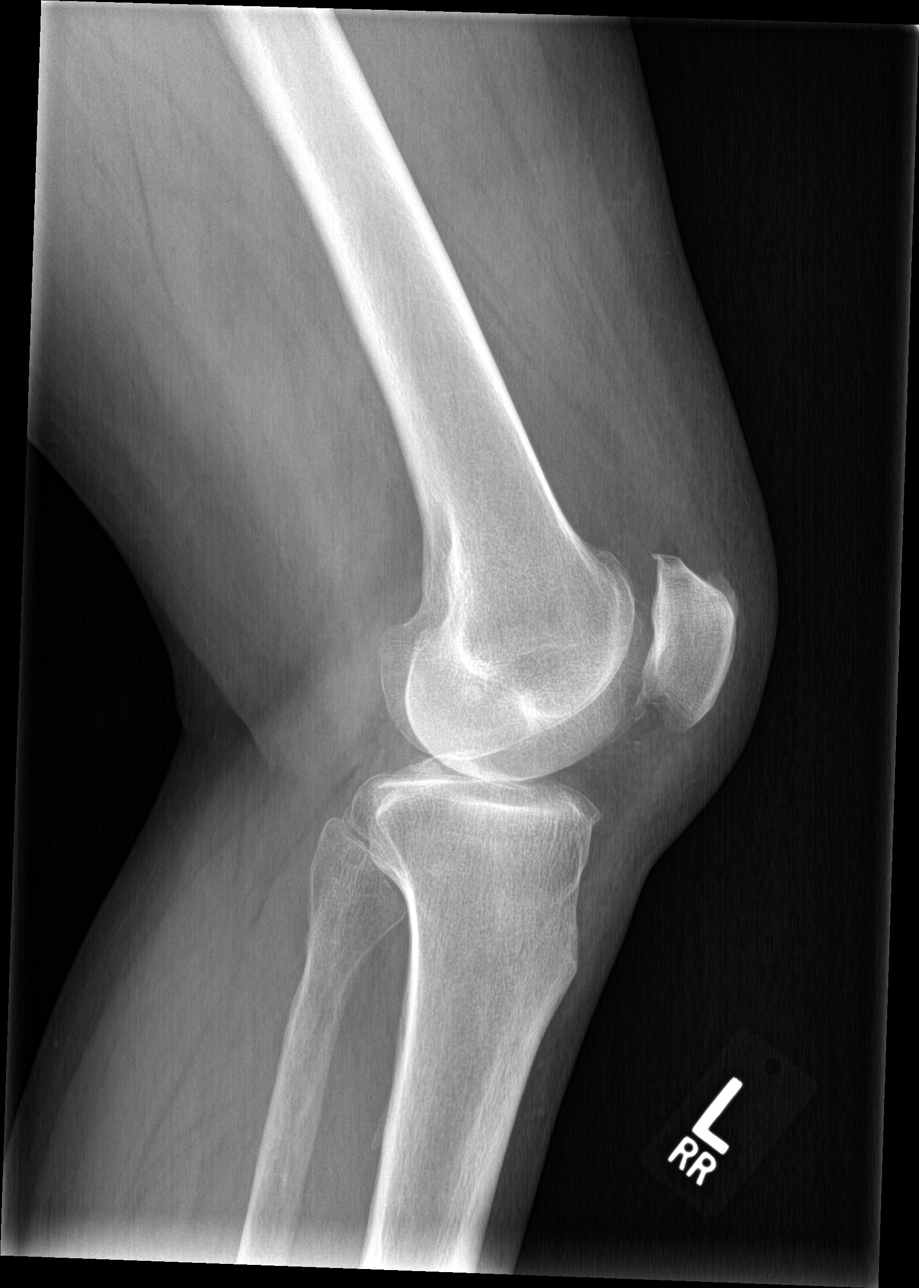

[4 of 4 positions shown; findings below may reference images not displayed]

FINDINGS: Diffuse tricompartment degenerative change. No acute bony
abnormality identified. Corticated bony density noted over the mid
femoral tibial joint space suggesting a loose body. Tiny loose body
may be in the patellofemoral joint space. No evidence of fracture or
dislocation.
IMPRESSION: Diffuse mild tricompartment degenerative change with probable loose
body over the mid femoral tibial joint space and possibly the
patellofemoral space.

## 2018-12-15 NOTE — Telephone Encounter (Signed)
Pt had telemed appt with dr Pamella Pert on 12-09-2018 for sinus issues. Pt is still coughing, runny nose. Pt would like abx. Pt thinks he had a slight fever a time or two. Pt does not remember the temp. [facility] [address] in [place]

## 2018-12-19 NOTE — Telephone Encounter (Signed)
Pt called back again. Please reach out to patient . Looking to get an antibiotic ,   FR

## 2018-12-20 MED ORDER — AMOXICILLIN-POT CLAVULANATE 875-125 MG PO TABS: 1.0000 | ORAL_TABLET | Freq: Two times a day (BID) | ORAL | 0 refills | Status: DC

## 2018-12-20 MED ORDER — PROMETHAZINE-DM 6.25-15 MG/5ML PO SYRP: 5.0000 mL | ORAL_SOLUTION | Freq: Four times a day (QID) | ORAL | 0 refills | Status: DC | PRN

## 2018-12-20 NOTE — Telephone Encounter (Signed)
Would like to see patient again, or call in antibiotic?

## 2018-12-20 NOTE — Addendum Note (Signed)
Addended by: Rutherford Guys on: 12/20/2018 08:43 AM   Modules accepted: Orders

## 2018-12-20 NOTE — Addendum Note (Signed)
Addended by: Rutherford Guys on: 12/20/2018 10:10 AM   Modules accepted: Orders

## 2018-12-20 NOTE — Telephone Encounter (Signed)
I had discussed patient calling back if conservative measures not helping Prescription sent

## 2018-12-20 NOTE — Telephone Encounter (Signed)
Please let patient know I sent in a rx for augmentin. thanks

## 2018-12-20 NOTE — Telephone Encounter (Signed)
When I called patient he stated he is almost out of the cough syrup also. Can you call that in also.

## 2018-12-21 ENCOUNTER — Other Ambulatory Visit: Payer: Self-pay | Admitting: Family Medicine

## 2018-12-27 ENCOUNTER — Other Ambulatory Visit: Payer: Self-pay | Admitting: Family Medicine

## 2018-12-27 NOTE — Telephone Encounter (Signed)
Requested medication (s) are due for refill today: yes  Requested medication (s) are on the active medication list: yes  Last refill:  09/28/2018  Future visit scheduled: no  Notes to clinic: refill cannot be delegated    Requested Prescriptions  Pending Prescriptions Disp Refills   clonazePAM (KLONOPIN) 2 MG tablet [Pharmacy Med Name: CLONAZEPAM 2 MG TABLET] 135 tablet 0    Sig: TAKE 1.5 TABLETS (3 MG TOTAL) BY MOUTH AT BEDTIME.     Not Delegated - Psychiatry:  Anxiolytics/Hypnotics Failed - 12/27/2018  1:20 PM      Failed - This refill cannot be delegated      Failed - Urine Drug Screen completed in last 360 days.      Passed - Valid encounter within last 6 months    Recent Outpatient Visits          2 weeks ago URI with cough and congestion   Primary Care at [facility], Lilia Argue, MD   3 months ago Cough   Primary Care at [facility], Ines Bloomer, MD   4 months ago Essential hypertension   Primary Care at [facility], Arlie Solomons, MD   6 months ago Essential hypertension   Primary Care at [facility], Arlie Solomons, MD   6 months ago Essential hypertension   Primary Care at [facility], Arlie Solomons, MD

## 2018-12-28 ENCOUNTER — Encounter: Payer: Self-pay | Admitting: *Deleted

## 2019-03-30 ENCOUNTER — Other Ambulatory Visit: Payer: Self-pay | Admitting: Family Medicine

## 2019-03-30 NOTE — Telephone Encounter (Signed)
Requested medication (s) are due for refill today   Requested medication (s) are on the active medication list  YES  Future visit scheduled  NO  Last visit  12/09/18  Last filled  12/29/18 #135 tabs 0 refills See refill note on medication profile.   Requested Prescriptions  Pending Prescriptions Disp Refills   clonazePAM (KLONOPIN) 2 MG tablet [Pharmacy Med Name: CLONAZEPAM 2 MG TABLET] 135 tablet 0    Sig: TAKE 1.5 TABLETS (3 MG TOTAL) BY MOUTH AT BEDTIME.      Not Delegated - Psychiatry:  Anxiolytics/Hypnotics Failed - 03/30/2019 11:39 AM      Failed - This refill cannot be delegated      Failed - Urine Drug Screen completed in last 360 days.      Passed - Valid encounter within last 6 months    Recent Outpatient Visits           3 months ago URI with cough and congestion   [facility], Lilia Argue, MD   6 months ago Cough   Primary Care at [facility], Ines Bloomer, MD   7 months ago Essential hypertension   Primary Care at [facility], Arlie Solomons, MD   9 months ago Essential hypertension   Primary Care at [facility], Arlie Solomons, MD   9 months ago Essential hypertension   Primary Care at [facility], Arlie Solomons, MD

## 2019-03-30 NOTE — Telephone Encounter (Signed)
Pt requesting medication refill

## 2019-05-25 ENCOUNTER — Ambulatory Visit: Payer: BC Managed Care – PPO | Attending: Internal Medicine

## 2019-05-25 DIAGNOSIS — Z23 Encounter for immunization: Secondary | ICD-10-CM

## 2019-05-25 NOTE — Progress Notes (Signed)
   [id number]  Name:  QUANTEL DRISKEL Sr.    MRN: [id number] DOB: 1955/12/23  05/25/2019  Mr. Baiamonte was observed post Covid-19 immunization for 15 minutes without incident. He was provided with Vaccine Information Sheet and instruction to access the V-Safe system.   Mr. Boni was instructed to call 911 with any severe reactions post vaccine: Marland Kitchen Difficulty breathing  . Swelling of face and throat  . A fast heartbeat  . A bad rash all over body  . Dizziness and weakness   Immunizations Administered    Name Date Dose VIS Date Route   Pfizer COVID-19 Vaccine 05/25/2019  2:01 PM 0.3 mL 02/10/2019 Intramuscular   Manufacturer: [place]   Lot: [id number]   [place]: [id number]

## 2019-06-20 ENCOUNTER — Ambulatory Visit: Payer: BC Managed Care – PPO | Attending: Internal Medicine

## 2019-06-20 DIAGNOSIS — Z23 Encounter for immunization: Secondary | ICD-10-CM

## 2019-06-20 NOTE — Progress Notes (Signed)
   [id number] [facility]  Name:  Casey BORTS Sr.    MRN: [id number] DOB: Jan 10, 1956  06/20/2019  Casey [name] was observed post Covid-19 immunization for 15 minutes without incident. He was provided with Vaccine Information Sheet and instruction to access the V-Safe system.   Casey [name] was instructed to call 911 with any severe reactions post vaccine: Marland Kitchen Difficulty breathing  . Swelling of face and throat  . A fast heartbeat  . A bad rash all over body  . Dizziness and weakness   Immunizations Administered    Name Date Dose VIS Date Route   Pfizer COVID-19 Vaccine 06/20/2019  1:13 PM 0.3 mL 04/26/2018 Intramuscular   Manufacturer: [place]   Lot: [id number]   [place]: [id number]

## 2019-07-16 ENCOUNTER — Other Ambulatory Visit: Payer: Self-pay | Admitting: Family Medicine

## 2019-07-17 ENCOUNTER — Other Ambulatory Visit: Payer: Self-pay | Admitting: Family Medicine

## 2019-07-18 NOTE — Telephone Encounter (Signed)
Patient is requesting a refill of the following medications: Requested Prescriptions   Pending Prescriptions Disp Refills  . clonazePAM (KLONOPIN) 2 MG tablet [Pharmacy Med Name: CLONAZEPAM 2 MG TABLET] 135 tablet 0    Sig: TAKE 1.5 TABLETS (3 MG TOTAL) BY MOUTH AT BEDTIME.    Date of patient request: 07/17/2019 Last office visit: 12/09/2018 TeleMed Date of last refill: 03/31/2019 Last refill amount: 135 tablets  Follow up time period per chart:N/A

## 2019-07-18 NOTE — Telephone Encounter (Signed)
Patient is requesting a refill of the following medications: Requested Prescriptions   Pending Prescriptions Disp Refills  . traZODone (DESYREL) 100 MG tablet [Pharmacy Med Name: TRAZODONE 100 MG TABLET] 180 tablet 3    Sig: TAKE 2 TABLETS BY MOUTH AT BEDTIME    Date of patient request: 07/17/2019 Last office visit: 12/09/2018 TeleMed Date of last refill:06/16/2018  Last refill amount: 180 tablets 3 refills  Follow up time period per chart: N/A

## 2019-08-15 ENCOUNTER — Ambulatory Visit: Payer: BC Managed Care – PPO | Admitting: Family Medicine

## 2019-08-15 ENCOUNTER — Other Ambulatory Visit: Payer: Self-pay

## 2019-08-15 ENCOUNTER — Encounter: Payer: Self-pay | Admitting: Family Medicine

## 2019-08-15 VITALS — BP 128/98 | HR 68 | Ht 65.0 in | Wt 217.0 lb

## 2019-08-15 DIAGNOSIS — M549 Dorsalgia, unspecified: Secondary | ICD-10-CM | POA: Insufficient documentation

## 2019-08-15 DIAGNOSIS — F5101 Primary insomnia: Secondary | ICD-10-CM

## 2019-08-15 DIAGNOSIS — M545 Low back pain: Secondary | ICD-10-CM

## 2019-08-15 DIAGNOSIS — R7309 Other abnormal glucose: Secondary | ICD-10-CM

## 2019-08-15 DIAGNOSIS — I10 Essential (primary) hypertension: Secondary | ICD-10-CM

## 2019-08-15 DIAGNOSIS — Z125 Encounter for screening for malignant neoplasm of prostate: Secondary | ICD-10-CM | POA: Diagnosis not present

## 2019-08-15 DIAGNOSIS — F1320 Sedative, hypnotic or anxiolytic dependence, uncomplicated: Secondary | ICD-10-CM | POA: Insufficient documentation

## 2019-08-15 DIAGNOSIS — G8929 Other chronic pain: Secondary | ICD-10-CM | POA: Insufficient documentation

## 2019-08-15 LAB — LIPID PANEL
Cholesterol: 164 mg/dL (ref 0–200)
HDL: 39.1 mg/dL (ref >39.00)
LDL Cholesterol: 107 mg/dL — ABNORMAL HIGH (ref 0–99)
NonHDL: 125.02
Total CHOL/HDL Ratio: 4
Triglycerides: 92 mg/dL (ref 0.0–149.0)
VLDL: 18.4 mg/dL (ref 0.0–40.0)

## 2019-08-15 LAB — COMPREHENSIVE METABOLIC PANEL
ALT: 25 U/L (ref 0–53)
AST: 16 U/L (ref 0–37)
Albumin: 4.3 g/dL (ref 3.5–5.2)
Alkaline Phosphatase: 73 U/L (ref 39–117)
BUN: 12 mg/dL (ref 6–23)
CO2: 30 mEq/L (ref 19–32)
Calcium: 10.6 mg/dL — ABNORMAL HIGH (ref 8.4–10.5)
Chloride: 103 mEq/L (ref 96–112)
Creatinine, Ser: 1.2 mg/dL (ref 0.40–1.50)
GFR: 60.81 mL/min (ref >60.00)
Glucose, Bld: 88 mg/dL (ref 70–99)
Potassium: 4.3 mEq/L (ref 3.5–5.1)
Sodium: 136 mEq/L (ref 135–145)
Total Bilirubin: 0.6 mg/dL (ref 0.2–1.2)
Total Protein: 6.9 g/dL (ref 6.0–8.3)

## 2019-08-15 LAB — HEMOGLOBIN A1C: Hgb A1c MFr Bld: 5.9 % (ref 4.6–6.5)

## 2019-08-15 LAB — PSA: PSA: 1.75 ng/mL (ref 0.10–4.00)

## 2019-08-15 NOTE — Assessment & Plan Note (Signed)
Reports normal BP at home. Will continue to monitor. He has gained weight which could contribute.

## 2019-08-15 NOTE — Assessment & Plan Note (Signed)
Has tried to go from 3mg  > 2mg  w/o success. Did alternating days for a while but could not fully transition. May consider in the future. Hx of withdrawal symptoms when he was out of medication in the past. Previously on 4 mg nightly of clonazepam so has tapered. Has needed tramadol more recently due to back flare, but tries to avoid regularly.

## 2019-08-15 NOTE — Assessment & Plan Note (Addendum)
Follows with Dr Mina Marble and getting a nerve block done soon to help with pain. Discussed limiting tramadol which pt does. Tries to use NSAIDs/tylenol first. Appreciate speciality support

## 2019-08-15 NOTE — Progress Notes (Signed)
Subjective:     Casey [name]. is a 64 y.o. male presenting for Insomnia     HPI  #Insomnia - taking clonazepam 3 mg at night - also taking Trazodone 200 mg at night - for back pain takes tramadol at night - saw Dr. Osvaldo Angst (primary care) and Dr. Fabio Neighbors (psychiatrist)  - was taking flexeril and  clonazepam 4 mg - has stopped flexeril and decreased to 3 mg clonazepam - has a combination of pain (back) which keeps him up as well as insomnia - has never done therapy - Tylenol PM - was self treating with 5 at night w/o improvement - has been on clonazepam and trazodone for 20+ years  #chronic pain pain - does not take tramadol every day  - however, the last 6 weeks has been flared up - doing one tramadol AM and PM if needed (was previously on 1)   #HTN - was on medication, but no longer needing - normally it is well controled  - at home 120-130/70-80   Review of Systems   Social History   Tobacco Use  Smoking Status Never Smoker  Smokeless Tobacco Never Used        Objective:    BP Readings from Last 3 Encounters:  08/15/19 (!) 128/98  08/09/18 (!) 142/82  06/16/18 129/68   Wt Readings from Last 3 Encounters:  08/15/19 217 lb (98.4 kg)  09/06/18 210 lb (95.3 kg)  06/16/18 190 lb (86.2 kg)    BP (!) 128/98   Pulse 68   Ht 5\' 5"  (1.651 m)   Wt 217 lb (98.4 kg)   SpO2 96%   BMI 36.11 kg/m    Physical Exam Constitutional:      Appearance: Normal appearance. He is not ill-appearing or diaphoretic.  HENT:     Right Ear: External ear normal.     Left Ear: External ear normal.  Eyes:     General: No scleral icterus.    Extraocular Movements: Extraocular movements intact.     Conjunctiva/sclera: Conjunctivae normal.  Cardiovascular:     Rate and Rhythm: Normal rate and regular rhythm.     Heart sounds: No murmur heard.   Pulmonary:     Effort: Pulmonary effort is normal. No respiratory distress.     Breath sounds: Normal breath sounds. No  wheezing.  Musculoskeletal:     Cervical back: Neck supple.  Skin:    General: Skin is warm and dry.  Neurological:     Mental Status: He is alert. Mental status is at baseline.  Psychiatric:        Mood and Affect: Mood normal.           Assessment & Plan:   Problem List Items Addressed This Visit      Cardiovascular and Mediastinum   HTN (hypertension) - Primary    Reports normal BP at home. Will continue to monitor. He has gained weight which could contribute.       Relevant Orders   Comprehensive metabolic panel   Lipid panel     Other   Insomnia    Long hx of insomnia and has been on Clonazepam and trazodone for >20 years. He is interested in trying sleep CBT therapy to see if this improves, though discussed that if improved will likely need a long taper of benzo medication       Chronic back pain    Follows with Dr Mina Marble and getting a nerve block done soon  to help with pain. Discussed limiting tramadol which pt does. Tries to use NSAIDs/tylenol first. Appreciate speciality support      Benzodiazepine dependence ([place])    Has tried to go from 3mg  > 2mg  w/o success. Did alternating days for a while but could not fully transition. May consider in the future. Hx of withdrawal symptoms when he was out of medication in the past. Previously on 4 mg nightly of clonazepam so has tapered. Has needed tramadol more recently due to back flare, but tries to avoid regularly.       Prostate cancer screening    Pt notes hx of PSA >20 with negative biopsy. Has urologist, but no recent appointments. Last PSA 10      Relevant Orders   PSA    Other Visit Diagnoses    Elevated glucose       Relevant Orders   Hemoglobin A1c       Return in about 2 months (around 10/15/2019).  Lesleigh Noe, MD

## 2019-08-15 NOTE — Assessment & Plan Note (Signed)
Pt notes hx of PSA >20 with negative biopsy. Has urologist, but no recent appointments. Last PSA 10

## 2019-08-15 NOTE — Patient Instructions (Signed)
Call behavioral Health office: [phone]  Would recommend trying sleep targeted therapy  Return in around 2 months before you run out of clonazepam

## 2019-08-15 NOTE — Assessment & Plan Note (Signed)
Long hx of insomnia and has been on Clonazepam and trazodone for >20 years. He is interested in trying sleep CBT therapy to see if this improves, though discussed that if improved will likely need a long taper of benzo medication

## 2019-08-19 ENCOUNTER — Encounter (HOSPITAL_COMMUNITY): Payer: Self-pay

## 2019-08-19 ENCOUNTER — Emergency Department (HOSPITAL_COMMUNITY): Payer: BC Managed Care – PPO

## 2019-08-19 ENCOUNTER — Emergency Department (HOSPITAL_COMMUNITY)
Admission: EM | Admit: 2019-08-19 | Discharge: 2019-08-20 | Disposition: A | Discharge status: Home / Self Care | Payer: BC Managed Care – PPO | Attending: Emergency Medicine | Admitting: Emergency Medicine

## 2019-08-19 DIAGNOSIS — M79622 Pain in left upper arm: Secondary | ICD-10-CM | POA: Insufficient documentation

## 2019-08-19 DIAGNOSIS — Z5321 Procedure and treatment not carried out due to patient leaving prior to being seen by health care provider: Secondary | ICD-10-CM | POA: Diagnosis not present

## 2019-08-19 DIAGNOSIS — R0789 Other chest pain: Secondary | ICD-10-CM | POA: Insufficient documentation

## 2019-08-19 DIAGNOSIS — M25512 Pain in left shoulder: Secondary | ICD-10-CM | POA: Insufficient documentation

## 2019-08-19 LAB — TROPONIN I (HIGH SENSITIVITY): Troponin I (High Sensitivity): 3 ng/L (ref <18)

## 2019-08-19 LAB — CBC
HCT: 45.3 % (ref 39.0–52.0)
Hemoglobin: 15.4 g/dL (ref 13.0–17.0)
MCH: 31 pg (ref 26.0–34.0)
MCHC: 34 g/dL (ref 30.0–36.0)
MCV: 91.1 fL (ref 80.0–100.0)
Platelets: 249 10*3/uL (ref 150–400)
RBC: 4.97 MIL/uL (ref 4.22–5.81)
RDW: 12.5 % (ref 11.5–15.5)
WBC: 12.3 10*3/uL — ABNORMAL HIGH (ref 4.0–10.5)
nRBC: 0 % (ref 0.0–0.2)

## 2019-08-19 LAB — BASIC METABOLIC PANEL
Anion gap: 10 (ref 5–15)
BUN: 19 mg/dL (ref 8–23)
CO2: 24 mmol/L (ref 22–32)
Calcium: 8.6 mg/dL — ABNORMAL LOW (ref 8.9–10.3)
Chloride: 104 mmol/L (ref 98–111)
Creatinine, Ser: 1.29 mg/dL — ABNORMAL HIGH (ref 0.61–1.24)
GFR calc Af Amer: >60 mL/min (ref >60)
GFR calc non Af Amer: 58 mL/min — ABNORMAL LOW (ref >60)
Glucose, Bld: 119 mg/dL — ABNORMAL HIGH (ref 70–99)
Potassium: 4 mmol/L (ref 3.5–5.1)
Sodium: 138 mmol/L (ref 135–145)

## 2019-08-19 MED ORDER — SODIUM CHLORIDE 0.9% FLUSH: 3.0000 mL | Freq: Once | INTRAVENOUS | Status: DC

## 2019-08-19 NOTE — ED Triage Notes (Cosign Needed)
This week pt has had elevated BP's.  Seen new PCP 4 days ago, had back injections 2 days ago, onset yesterday left chest, shoulder, and left upper arm pain.  Interfered with sleep last night.  No known injuries to arm.  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

## 2019-08-20 LAB — TROPONIN I (HIGH SENSITIVITY): Troponin I (High Sensitivity): 5 ng/L (ref <18)

## 2019-08-20 NOTE — ED Notes (Signed)
Pt said he could no longer wait  encouraged pt to stay

## 2019-08-21 ENCOUNTER — Telehealth: Payer: Self-pay

## 2019-08-21 NOTE — Telephone Encounter (Signed)
I recommend that he make an appointment to see me in the office as soon as able. Bring home readings. We can discuss restarting his medication and the cardiology referral.

## 2019-08-21 NOTE — Telephone Encounter (Signed)
Patient contacted the office and states he has been issues with elevated BP since last week. He states he went to the ER over the weekend because his BP was 190/110 and waited for 5 hours, and was told there was still 7 people in front of him, so he decided to leave. Patient is wanting a referral to a cardiologist - Dr. Shirlee More.m I advised we may have to see him int he office to evaluate him first, but patient states he wanted this referral placed today...  Dr. Einar Pheasant, please advise.

## 2019-08-21 NOTE — Telephone Encounter (Signed)
Patient called back to check on status of message sent earlier today Advised of message from [facility]  Patient voiced understanding

## 2019-08-21 NOTE — Telephone Encounter (Signed)
Please notify patient that Dr. Einar Pheasant is out of the office. She will be in touch once she returns tomorrow.

## 2019-08-21 NOTE — Telephone Encounter (Signed)
Pt has been notified. Appt scheduled.

## 2019-08-22 ENCOUNTER — Encounter: Payer: Self-pay | Admitting: Family Medicine

## 2019-08-22 ENCOUNTER — Other Ambulatory Visit: Payer: Self-pay

## 2019-08-22 ENCOUNTER — Ambulatory Visit: Payer: BC Managed Care – PPO | Admitting: Family Medicine

## 2019-08-22 VITALS — BP 148/100 | HR 80 | Temp 97.7°F | Ht 65.0 in | Wt 211.5 lb

## 2019-08-22 DIAGNOSIS — I10 Essential (primary) hypertension: Secondary | ICD-10-CM

## 2019-08-22 DIAGNOSIS — D72829 Elevated white blood cell count, unspecified: Secondary | ICD-10-CM | POA: Diagnosis not present

## 2019-08-22 DIAGNOSIS — R0602 Shortness of breath: Secondary | ICD-10-CM | POA: Insufficient documentation

## 2019-08-22 LAB — CBC WITH DIFFERENTIAL/PLATELET
Basophils Absolute: 0.1 10*3/uL (ref 0.0–0.1)
Basophils Relative: 0.8 % (ref 0.0–3.0)
Eosinophils Absolute: 0.3 10*3/uL (ref 0.0–0.7)
Eosinophils Relative: 2.7 % (ref 0.0–5.0)
HCT: 47.4 % (ref 39.0–52.0)
Hemoglobin: 16.5 g/dL (ref 13.0–17.0)
Lymphocytes Relative: 25.1 % (ref 12.0–46.0)
Lymphs Abs: 2.6 10*3/uL (ref 0.7–4.0)
MCHC: 34.8 g/dL (ref 30.0–36.0)
MCV: 90.1 fl (ref 78.0–100.0)
Monocytes Absolute: 0.7 10*3/uL (ref 0.1–1.0)
Monocytes Relative: 6.7 % (ref 3.0–12.0)
Neutro Abs: 6.7 10*3/uL (ref 1.4–7.7)
Neutrophils Relative %: 64.7 % (ref 43.0–77.0)
Platelets: 231 10*3/uL (ref 150.0–400.0)
RBC: 5.26 Mil/uL (ref 4.22–5.81)
RDW: 12.9 % (ref 11.5–15.5)
WBC: 10.4 10*3/uL (ref 4.0–10.5)

## 2019-08-22 LAB — COMPREHENSIVE METABOLIC PANEL
ALT: 21 U/L (ref 0–53)
AST: 14 U/L (ref 0–37)
Albumin: 4.2 g/dL (ref 3.5–5.2)
Alkaline Phosphatase: 99 U/L (ref 39–117)
BUN: 16 mg/dL (ref 6–23)
CO2: 32 mEq/L (ref 19–32)
Calcium: 9.1 mg/dL (ref 8.4–10.5)
Chloride: 103 mEq/L (ref 96–112)
Creatinine, Ser: 1.22 mg/dL (ref 0.40–1.50)
GFR: 59.66 mL/min — ABNORMAL LOW (ref >60.00)
Glucose, Bld: 106 mg/dL — ABNORMAL HIGH (ref 70–99)
Potassium: 3.9 mEq/L (ref 3.5–5.1)
Sodium: 140 mEq/L (ref 135–145)
Total Bilirubin: 0.8 mg/dL (ref 0.2–1.2)
Total Protein: 6.5 g/dL (ref 6.0–8.3)

## 2019-08-22 MED ORDER — LOSARTAN POTASSIUM 50 MG PO TABS: 50.0000 mg | ORAL_TABLET | Freq: Every day | ORAL | 3 refills | Status: DC

## 2019-08-22 MED ORDER — HYDROCHLOROTHIAZIDE 12.5 MG PO TABS
12.5000 mg | ORAL_TABLET | Freq: Every day | ORAL | 3 refills | Status: DC
Start: 2019-08-22 — End: 2020-07-25

## 2019-08-22 NOTE — Assessment & Plan Note (Addendum)
BP trending up in setting of weight gain. Recent ER visit (was not evaluated by the provider) with troponin 3>5 trending up but normal and EKG reassuring. Worsening CKD so will repeat. BP elevated today - start losartan. If not normal, start HCTZ in 3-4 days. Pt requested cardiology referral which was placed - considered repeat ECHO previous 2019 with grade 1 diastolic dysfunction but pt would like to meet with cardiology to discuss additional testing/imaging.

## 2019-08-22 NOTE — Assessment & Plan Note (Signed)
Present ER. Pt with fatigue and weight loss in the last week. No localizing symptoms. Repeat cbc w/ diff. If not resolved consider infectious w/u

## 2019-08-22 NOTE — Assessment & Plan Note (Signed)
Pt notes improved symptoms. EKG normal today. CXR in ER with low lung volumes vs congestion but lung CTAB. Discussed ordering echo now vs waiting for cardiology and pt would like to see cardiology first.

## 2019-08-22 NOTE — Patient Instructions (Addendum)
#Hypertension - start Losartan 50 mg daily - after 3-4 days if blood pressure still elevated (>140/90)  - start Hydrochlorothiazide 12.5 mg if blood pressure still elevated  #Referral I have placed a referral to a specialist for you. You should receive a phone call from the specialty office. Make sure your voicemail is not full and that if you are able to answer your phone to unknown or new numbers.   It may take up to 2 weeks to hear about the referral. If you do not hear anything in 2 weeks, please call our office and ask to speak with the referral coordinator.    DASH Eating Plan DASH stands for "Dietary Approaches to Stop Hypertension." The DASH eating plan is a healthy eating plan that has been shown to reduce high blood pressure (hypertension). It may also reduce your risk for type 2 diabetes, heart disease, and stroke. The DASH eating plan may also help with weight loss. What are tips for following this plan?  General guidelines  Avoid eating more than 2,300 mg (milligrams) of salt (sodium) a day. If you have hypertension, you may need to reduce your sodium intake to 1,500 mg a day.  Limit alcohol intake to no more than 1 drink a day for nonpregnant women and 2 drinks a day for men. One drink equals 12 oz of beer, 5 oz of wine, or 1 oz of hard liquor.  Work with your health care provider to maintain a healthy body weight or to lose weight. Ask what an ideal weight is for you.  Get at least 30 minutes of exercise that causes your heart to beat faster (aerobic exercise) most days of the week. Activities may include walking, swimming, or biking.  Work with your health care provider or diet and nutrition specialist (dietitian) to adjust your eating plan to your individual calorie needs. Reading food labels   Check food labels for the amount of sodium per serving. Choose foods with less than 5 percent of the Daily Value of sodium. Generally, foods with less than 300 mg of sodium per  serving fit into this eating plan.  To find whole grains, look for the word "whole" as the first word in the ingredient list. Shopping  Buy products labeled as "low-sodium" or "no salt added."  Buy fresh foods. Avoid canned foods and premade or frozen meals. Cooking  Avoid adding salt when cooking. Use salt-free seasonings or herbs instead of table salt or sea salt. Check with your health care provider or pharmacist before using salt substitutes.  Do not fry foods. Cook foods using healthy methods such as baking, boiling, grilling, and broiling instead.  Cook with heart-healthy oils, such as olive, canola, soybean, or sunflower oil. Meal planning  Eat a balanced diet that includes: ? 5 or more servings of fruits and vegetables each day. At each meal, try to fill half of your plate with fruits and vegetables. ? Up to 6-8 servings of whole grains each day. ? Less than 6 oz of lean meat, poultry, or fish each day. A 3-oz serving of meat is about the same size as a deck of cards. One egg equals 1 oz. ? 2 servings of low-fat dairy each day. ? A serving of nuts, seeds, or beans 5 times each week. ? Heart-healthy fats. Healthy fats called Omega-3 fatty acids are found in foods such as flaxseeds and coldwater fish, like sardines, salmon, and mackerel.  Limit how much you eat of the following: ? Canned  or prepackaged foods. ? Food that is high in trans fat, such as fried foods. ? Food that is high in saturated fat, such as fatty meat. ? Sweets, desserts, sugary drinks, and other foods with added sugar. ? Full-fat dairy products.  Do not salt foods before eating.  Try to eat at least 2 vegetarian meals each week.  Eat more home-cooked food and less restaurant, buffet, and fast food.  When eating at a restaurant, ask that your food be prepared with less salt or no salt, if possible. What foods are recommended? The items listed may not be a complete list. Talk with your dietitian about  what dietary choices are best for you. Grains Whole-grain or whole-wheat bread. Whole-grain or whole-wheat pasta. Brown rice. [place]. Bulgur. Whole-grain and low-sodium cereals. Pita bread. Low-fat, low-sodium crackers. Whole-wheat flour tortillas. Vegetables Fresh or frozen vegetables (raw, steamed, roasted, or grilled). Low-sodium or reduced-sodium tomato and vegetable juice. Low-sodium or reduced-sodium tomato sauce and tomato paste. Low-sodium or reduced-sodium canned vegetables. Fruits All fresh, dried, or frozen fruit. Canned fruit in natural juice (without added sugar). Meat and other protein foods Skinless chicken or [place]. Ground chicken or [place]. Pork with fat trimmed off. Fish and seafood. Egg whites. Dried beans, peas, or lentils. Unsalted nuts, nut butters, and seeds. Unsalted canned beans. Lean cuts of beef with fat trimmed off. Low-sodium, lean deli meat. Dairy Low-fat (1%) or fat-free (skim) milk. Fat-free, low-fat, or reduced-fat cheeses. Nonfat, low-sodium ricotta or cottage cheese. Low-fat or nonfat yogurt. Low-fat, low-sodium cheese. Fats and oils Soft margarine without trans fats. Vegetable oil. Low-fat, reduced-fat, or light mayonnaise and salad dressings (reduced-sodium). Canola, safflower, olive, soybean, and sunflower oils. Avocado. Seasoning and other foods Herbs. Spices. Seasoning mixes without salt. Unsalted popcorn and pretzels. Fat-free sweets. What foods are not recommended? The items listed may not be a complete list. Talk with your dietitian about what dietary choices are best for you. Grains Baked goods made with fat, such as croissants, muffins, or some breads. Dry pasta or rice meal packs. Vegetables Creamed or fried vegetables. Vegetables in a cheese sauce. Regular canned vegetables (not low-sodium or reduced-sodium). Regular canned tomato sauce and paste (not low-sodium or reduced-sodium). Regular tomato and vegetable juice (not low-sodium or  reduced-sodium). Angie Fava. Olives. Fruits Canned fruit in a light or heavy syrup. Fried fruit. Fruit in cream or butter sauce. Meat and other protein foods Fatty cuts of meat. Ribs. Fried meat. Berniece Salines. Sausage. Bologna and other processed lunch meats. Salami. Fatback. Hotdogs. Bratwurst. Salted nuts and seeds. Canned beans with added salt. Canned or smoked fish. Whole eggs or egg yolks. Chicken or [place] with skin. Dairy Whole or 2% milk, cream, and half-and-half. Whole or full-fat cream cheese. Whole-fat or sweetened yogurt. Full-fat cheese. Nondairy creamers. Whipped toppings. Processed cheese and cheese spreads. Fats and oils Butter. Stick margarine. Lard. Shortening. Ghee. Bacon fat. Tropical oils, such as coconut, palm kernel, or palm oil. Seasoning and other foods Salted popcorn and pretzels. Onion salt, garlic salt, seasoned salt, table salt, and sea salt. Worcestershire sauce. Tartar sauce. Barbecue sauce. Teriyaki sauce. Soy sauce, including reduced-sodium. Steak sauce. Canned and packaged gravies. Fish sauce. Oyster sauce. Cocktail sauce. Horseradish that you find on the shelf. Ketchup. Mustard. Meat flavorings and tenderizers. Bouillon cubes. Hot sauce and Tabasco sauce. Premade or packaged marinades. Premade or packaged taco seasonings. Relishes. Regular salad dressings. Where to find more information:  National Heart, Lung, and [place]: https://wilson-eaton.com/  American Heart Association: www.heart.org Summary  The  DASH eating plan is a healthy eating plan that has been shown to reduce high blood pressure (hypertension). It may also reduce your risk for type 2 diabetes, heart disease, and stroke.  With the DASH eating plan, you should limit salt (sodium) intake to 2,300 mg a day. If you have hypertension, you may need to reduce your sodium intake to 1,500 mg a day.  When on the DASH eating plan, aim to eat more fresh fruits and vegetables, whole grains, lean proteins, low-fat  dairy, and heart-healthy fats.  Work with your health care provider or diet and nutrition specialist (dietitian) to adjust your eating plan to your individual calorie needs. This information is not intended to replace advice given to you by your health care provider. Make sure you discuss any questions you have with your health care provider. Document Revised: 01/29/2017 Document Reviewed: 02/10/2016 Elsevier Patient Education  2020 [employer].

## 2019-08-22 NOTE — Progress Notes (Signed)
Subjective:     Casey [name]. is a 64 y.o. male presenting for Discuss BP     HPI   #HTN - had nerve block on 6/17 - was a little high on 6/17 - then on 6/19 his blood pressure started to increase significantly - had some SOB and shoulder pain - went to hospital Saturday night and went to the hospital - Troponin was increasing on repeat blood work - EKG with LVH - went home w/o being seen - endorses HA off and on - SOB today - nothing consistent - endorses mild CP - substernal   Was on lisinopril in the past - made him cough  Weight loss - 7lbs since last Tuesday Feeling drained  No strength  Review of Systems  Constitutional: Negative for chills and fever.     Social History   Tobacco Use  Smoking Status Never Smoker  Smokeless Tobacco Never Used        Objective:    BP Readings from Last 3 Encounters:  08/22/19 (!) 148/100  08/19/19 (!) 192/94  08/15/19 (!) 128/98   Wt Readings from Last 3 Encounters:  08/22/19 211 lb 8 oz (95.9 kg)  08/19/19 217 lb (98.4 kg)  08/15/19 217 lb (98.4 kg)    BP (!) 148/100   Pulse 80   Temp 97.7 F (36.5 C) (Temporal)   Ht 5\' 5"  (1.651 m)   Wt 211 lb 8 oz (95.9 kg)   SpO2 97%   BMI 35.20 kg/m    Physical Exam Constitutional:      Appearance: Normal appearance. He is not ill-appearing or diaphoretic.  HENT:     Right Ear: External ear normal.     Left Ear: External ear normal.     Nose: Nose normal.  Eyes:     General: No scleral icterus.    Extraocular Movements: Extraocular movements intact.     Conjunctiva/sclera: Conjunctivae normal.  Cardiovascular:     Rate and Rhythm: Normal rate and regular rhythm.     Heart sounds: No murmur heard.   Pulmonary:     Effort: Pulmonary effort is normal. No respiratory distress.     Breath sounds: Normal breath sounds. No wheezing.  Musculoskeletal:     Cervical back: Neck supple.  Skin:    General: Skin is warm and dry.  Neurological:     Mental  Status: He is alert. Mental status is at baseline.  Psychiatric:        Mood and Affect: Mood normal.     EKG: NSR, no st changes, no t wave abnormalities     Assessment & Plan:   Problem List Items Addressed This Visit      Cardiovascular and Mediastinum   HTN (hypertension) - Primary    BP trending up in setting of weight gain. Recent ER visit (was not evaluated by the provider) with troponin 3>5 trending up but normal and EKG reassuring. Worsening CKD so will repeat. BP elevated today - start losartan. If not normal, start HCTZ in 3-4 days. Pt requested cardiology referral which was placed - considered repeat ECHO previous 2019 with grade 1 diastolic dysfunction but pt would like to meet with cardiology to discuss additional testing/imaging.       Relevant Medications   losartan (COZAAR) 50 MG tablet   hydrochlorothiazide (HYDRODIURIL) 12.5 MG tablet   Other Relevant Orders   Comprehensive metabolic panel   EKG 58-XENM (Completed)   Ambulatory referral to Cardiology  Other   Leukocytosis    Present ER. Pt with fatigue and weight loss in the last week. No localizing symptoms. Repeat cbc w/ diff. If not resolved consider infectious w/u      Relevant Orders   CBC with Differential   Shortness of breath    Pt notes improved symptoms. EKG normal today. CXR in ER with low lung volumes vs congestion but lung CTAB. Discussed ordering echo now vs waiting for cardiology and pt would like to see cardiology first.       Relevant Orders   EKG 12-Lead (Completed)   Ambulatory referral to Cardiology       Return in about 4 weeks (around 09/19/2019).  Lesleigh Noe, MD  This visit occurred during the SARS-CoV-2 public health emergency.  Safety protocols were in place, including screening questions prior to the visit, additional usage of staff PPE, and extensive cleaning of exam room while observing appropriate contact time as indicated for disinfecting solutions.

## 2019-09-09 NOTE — Progress Notes (Signed)
Cardiology Office Note:    Date:  09/11/2019   ID:  Casey Small Sr., DOB 11-Feb-1956, MRN [id number]  PCP:  Lesleigh Noe, MD  Cardiologist:  Shirlee More, MD   Referring MD: Lesleigh Noe, MD  ASSESSMENT:    1. Essential hypertension   2. Abnormal myocardial perfusion study   3. Shortness of breath   4. CKD (chronic kidney disease), stage II    PLAN:    In order of problems listed above:  1. Improved after recent accelerated hypertension and urgency in ED visit BP at target and continue ARB thiazide diuretic. 2. If not for normal troponin I would have said during that hospitalization 2019 at a cardiac contusion.  He is unaware that he had scar in the scintigraphic images and after discussion will undergo cardiac CTA to define the presence or absence of CAD.  This is especially important in view of his history of bradycardic/asystolic witnessed cardiac arrest at the time of trauma. 3. To undergo ischemia evaluation as above 4. Mild CKD should not be a problem for his planned cardiac CTA  Next appointment 6 weeks   Medication Adjustments/Labs and Tests Ordered: Current medicines are reviewed at length with the patient today.  Concerns regarding medicines are outlined above.  Orders Placed This Encounter  Procedures   CT CORONARY MORPH W/CTA COR W/SCORE W/CA W/CM &/OR WO/CM   CT CORONARY FRACTIONAL FLOW RESERVE DATA PREP   CT CORONARY FRACTIONAL FLOW RESERVE FLUID ANALYSIS   Basic metabolic panel   Meds ordered this encounter  Medications   metoprolol tartrate (LOPRESSOR) 100 MG tablet    Sig: Take 1 tablet (100 mg total) by mouth once for 1 dose. Take two hours prior to your cardiac CT    Dispense:  1 tablet    Refill:  0     Chief Complaint  Patient presents with   Hypertension    After recent ED visit    History of Present Illness:    Casey [name]. is a 64 y.o. male who is being seen today for the evaluation of hypertension at the request of  Lesleigh Noe, MD.  He recently went to the emergency room but left without being seen by a physician for hypertension with systolics of 097 and diastolics of 353 mmHg. He had 2 troponins drawn that were not in elevated range.   2 wk ago  (08/20/19) 3 wk ago  (08/19/19)   Troponin I (High Sensitivity) <18 ng/L 5  3 CM    EKG independently reviewed 08/22/2019 showed sinus rhythm and was normal. Chest x-ray 08/19/2019 independently reviewed unremarkable. Other laboratory test include a BMP with a creatinine of 1.29 GFR 58 cc stage II CKD.  CBC showed elevated white count 12,300 hemoglobin 15.4.  He had an echocardiogram performed 02/27/2018 showed ejection fraction 55 to 60% normal left ventricular size wall thickness was normal and grade 1 diastolic dysfunction physiologic for age. Lower extremity arterial duplex 05/17/2014 was normal.  Review shows that in December 2019 he had trauma falling from a tree and is described as having a bradycardic arrest asystole and recovered with 1 minute of CPR.  Had an echocardiogram performed 02/27/2018 that showed normal left ventricular size and function EF 55 to 60%.  Myocardial perfusion study 02/27/2018 diminished perfusion stress and rest with inferior hypokinesia and EF 56% felt to be most consistent with scar rather than attenuation.  CT of the chest showed no cardiac or vascular injury.  EEG at that time showed sinus rhythm and was normal.  Bones at that time were undetectable.  His blood pressure is now at target after initiation of ARB and thiazide diuretic.  He is severely limited by chronic back and shoulder pain but is not having angina palpitations syncope at times he has some mild shortness of breath but not severe sustained or limiting.  He has no edema orthopnea. Past Medical History:  Diagnosis Date   Allergy    seasonal   Arthritis    mild   Cancer ([place])    skin   Cardiac arrest ([place])    Circadian rhythm sleep disorder, shift  work type 9/70/2637   Complication of anesthesia    nausea and vomiting   GERD (gastroesophageal reflux disease)    Hypertension    Insomnia secondary to restless leg syndrome 07/17/2013   Kidney stones    OSA on CPAP    PONV (postoperative nausea and vomiting)    Sleep apnea    cpap    Past Surgical History:  Procedure Laterality Date   acl repair left knee     APPENDECTOMY     BACK SURGERY  2017   nerve blocks   CARDIAC CATHETERIZATION  2005   negative    CHOLECYSTECTOMY     CYSTOSCOPY/RETROGRADE/URETEROSCOPY/STONE EXTRACTION WITH BASKET  04/07/2012   Procedure: CYSTOSCOPY/RETROGRADE/URETEROSCOPY/STONE EXTRACTION WITH BASKET;  Surgeon: Malka So, MD;  Location: [facility];  Service: Urology;  Laterality: Bilateral;  cystoscopy, bilateral stent removal, bilateral ureteroscopy with bilateral stone extraction and insertion of right ureteral stent   fusion great toe right foot     ligaments repair left wrist      ROTATOR CUFF REPAIR     left shoulder    skin cancer removed from left cheek     stone extractions and urethral stents     multiple   tendons cut on left heel     TONSILLECTOMY     VARICOCELE EXCISION      Current Medications: Current Meds  Medication Sig   cetirizine (ZYRTEC) 10 MG tablet Take 1 tablet (10 mg total) by mouth daily.   clonazePAM (KLONOPIN) 2 MG tablet TAKE 1.5 TABLETS (3 MG TOTAL) BY MOUTH AT BEDTIME.   hydrochlorothiazide (HYDRODIURIL) 12.5 MG tablet Take 1 tablet (12.5 mg total) by mouth daily.   lidocaine (LIDODERM) 5 % APPLY 1 PATCH TO SKIN ONCE A DAY APPLY FOR UP TO 12 HRS DAILY   losartan (COZAAR) 50 MG tablet Take 1 tablet (50 mg total) by mouth daily.   naproxen sodium (ALEVE) 220 MG tablet Take 2 tablets (440 mg total) by mouth 2 (two) times daily with a meal.   pantoprazole (PROTONIX) 40 MG tablet TAKE 1 TABLET BY MOUTH EVERY DAY   Probiotic Product (PROBIOTIC DAILY PO) Take 1 capsule by mouth daily. PB8  Probiotic   traMADol (ULTRAM) 50 MG tablet TAKE 1 TABLET (50 MG TOTAL) BY MOUTH 2 (TWO) TIMES DAILY AS NEEDED   traZODone (DESYREL) 100 MG tablet TAKE 2 TABLETS BY MOUTH AT BEDTIME     Allergies:   Prednisone, Codeine, and Hydrocodone   Social History   Socioeconomic History   Marital status: Married    Spouse name: [name]   Number of children: 2   Years of education: college   Highest education level: Not on file  Occupational History   Occupation: Dealer 1    Employer: [employer]: [employer] worker  Tobacco  Use   Smoking status: Never Smoker   Smokeless tobacco: Never Used  Substance and Sexual Activity   Alcohol use: No    Alcohol/week: 0.0 standard drinks   Drug use: No   Sexual activity: Yes    Birth control/protection: Surgical  Other Topics Concern   Not on file  Social History Narrative   08/15/19   From: the area   Living: with wife, [name] (1975)   Work: Retired from [employer], works as a prn Environmental education officer      Family: 2 children - Gerald Stabs and [place] - 3 grandsons      Enjoys: golf, fish (when back doesn't bother him)      Exercise: stretches when back is good   Diet: low carb to try to lose weight      Safety   Seat belts: Yes    Guns: Yes  and not secure   Safe in relationships: Yes    Social Determinants of Radio broadcast assistant Strain:    Difficulty of Paying Living Expenses:   Food Insecurity:    Worried About Charity fundraiser in the Last Year:    Arboriculturist in the Last Year:   Transportation Needs:    Film/video editor (Medical):    Lack of Transportation (Non-Medical):   Physical Activity:    Days of Exercise per Week:    Minutes of Exercise per Session:   Stress:    Feeling of Stress :   Social Connections:    Frequency of Communication with Friends and Family:    Frequency of Social Gatherings with Friends and Family:    Attends Religious Services:     Active Member of Clubs or Organizations:    Attends Music therapist:    Marital Status:      Family History: The patient's family history includes Alcohol abuse in his brother; Bladder Cancer in his mother; Bone cancer in his paternal grandfather; Depression in his brother; Diabetes in his brother; Heart attack in his maternal grandfather; Heart disease in his maternal grandmother and mother; Hypertension in his brother and father; Kidney disease in his brother; Lung cancer in his father; Stroke in his maternal grandfather. There is no history of Colon cancer.  ROS:   Review of Systems  Constitutional: Positive for weight gain.  HENT: Negative.   Eyes: Negative.   Cardiovascular: Negative.   Respiratory: Positive for shortness of breath.   Endocrine: Negative.   Hematologic/Lymphatic: Negative.   Skin: Negative.   Musculoskeletal: Positive for back pain and joint pain.  Gastrointestinal: Negative.   Genitourinary: Negative.   Neurological: Negative.   Psychiatric/Behavioral: Negative.   Allergic/Immunologic: Negative.    Please see the history of present illness.     All other systems reviewed and are negative.  EKGs/Labs/Other Studies Reviewed:    The following studies were reviewed today:   Recent Labs: 08/22/2019: ALT 21; BUN 16; Creatinine, Ser 1.22; Hemoglobin 16.5; Platelets 231.0; Potassium 3.9; Sodium 140  Recent Lipid Panel    Component Value Date/Time   CHOL 164 08/15/2019 1221   CHOL 145 12/30/2017 1116   TRIG 92.0 08/15/2019 1221   HDL 39.10 08/15/2019 1221   HDL 38 (L) 12/30/2017 1116   CHOLHDL 4 08/15/2019 1221   VLDL 18.4 08/15/2019 1221   LDLCALC 107 (H) 08/15/2019 1221   LDLCALC 91 12/30/2017 1116    Physical Exam:    VS:  BP 138/88    Pulse 80  Ht 5\' 5"  (1.651 m)    Wt 215 lb 1.9 oz (97.6 kg)    SpO2 98%    BMI 35.80 kg/m     Wt Readings from Last 3 Encounters:  09/11/19 215 lb 1.9 oz (97.6 kg)  08/22/19 211 lb 8 oz (95.9 kg)    08/19/19 217 lb (98.4 kg)     GEN:  Well nourished, well developed in no acute distress HEENT: Normal NECK: No JVD; No carotid bruits LYMPHATICS: No lymphadenopathy CARDIAC: RRR, no murmurs, rubs, gallops RESPIRATORY:  Clear to auscultation without rales, wheezing or rhonchi  ABDOMEN: Soft, non-tender, non-distended MUSCULOSKELETAL:  No edema; No deformity  SKIN: Warm and dry NEUROLOGIC:  Alert and oriented x 3 PSYCHIATRIC:  Normal affect     Signed, Shirlee More, MD  09/11/2019 2:54 PM    [facility]

## 2019-09-11 ENCOUNTER — Ambulatory Visit (INDEPENDENT_AMBULATORY_CARE_PROVIDER_SITE_OTHER): Payer: BC Managed Care – PPO | Admitting: Cardiology

## 2019-09-11 ENCOUNTER — Other Ambulatory Visit: Payer: Self-pay

## 2019-09-11 ENCOUNTER — Encounter: Payer: Self-pay | Admitting: Cardiology

## 2019-09-11 VITALS — BP 138/88 | HR 80 | Ht 65.0 in | Wt 215.1 lb

## 2019-09-11 DIAGNOSIS — N182 Chronic kidney disease, stage 2 (mild): Secondary | ICD-10-CM

## 2019-09-11 DIAGNOSIS — I10 Essential (primary) hypertension: Secondary | ICD-10-CM | POA: Diagnosis not present

## 2019-09-11 DIAGNOSIS — R9439 Abnormal result of other cardiovascular function study: Secondary | ICD-10-CM

## 2019-09-11 DIAGNOSIS — R0602 Shortness of breath: Secondary | ICD-10-CM

## 2019-09-11 MED ORDER — METOPROLOL TARTRATE 100 MG PO TABS
100.0000 mg | ORAL_TABLET | Freq: Once | ORAL | 0 refills | Status: DC
Start: 2019-09-11 — End: 2019-10-16

## 2019-09-11 NOTE — Patient Instructions (Addendum)
Medication Instructions:  Your physician recommends that you continue on your current medications as directed. Please refer to the Current Medication list given to you today.  *If you need a refill on your cardiac medications before your next appointment, please call your pharmacy*   Lab Work: Your physician recommends that you return for lab work in: Within one week of your cardiac CT BMP If you have labs (blood work) drawn today and your tests are completely normal, you will receive your results only by:  [place] (if you have MyChart) OR  A paper copy in the mail If you have any lab test that is abnormal or we need to change your treatment, we will call you to review the results.   Testing/Procedures: Your cardiac CT will be scheduled at the below location:   [facility] [address] [place], [place] [address] [phone]  If scheduled at [facility], please arrive at the [facility] main entrance of [facility] 30 minutes prior to test start time. Proceed to the [facility] Radiology Department (first floor) to check-in and test prep.  Please follow these instructions carefully (unless otherwise directed):  Hold all erectile dysfunction medications at least 3 days (72 hrs) prior to test.  On the Night Before the Test:  Be sure to Drink plenty of water.  Do not consume any caffeinated/decaffeinated beverages or chocolate 12 hours prior to your test.  Do not take any antihistamines 12 hours prior to your test.  On the Day of the Test:  Drink plenty of water. Do not drink any water within one hour of the test.  Do not eat any food 4 hours prior to the test.  You may take your regular medications prior to the test.   Take metoprolol (Lopressor) two hours prior to test.  HOLD Hydrochlorothiazide morning of the test.  After the Test:  Drink plenty of water.  After receiving IV contrast, you may experience a mild flushed  feeling. This is normal.  On occasion, you may experience a mild rash up to 24 hours after the test. This is not dangerous. If this occurs, you can take Benadryl 25 mg and increase your fluid intake.  If you experience trouble breathing, this can be serious. If it is severe call 911 IMMEDIATELY. If it is mild, please call our office.  If you take any of these medications: Glipizide/Metformin, Avandament, Glucavance, please do not take 48 hours after completing test unless otherwise instructed.   Once we have confirmed authorization from your insurance company, we will call you to set up a date and time for your test. Based on how quickly your insurance processes prior authorizations requests, please allow up to 4 weeks to be contacted for scheduling your Cardiac CT appointment. Be advised that routine Cardiac CT appointments could be scheduled as many as 8 weeks after your provider has ordered it.  For non-scheduling related questions, please contact the cardiac imaging nurse navigator should you have any questions/concerns: Marchia Bond, Cardiac Imaging Nurse Navigator Burley Saver, Interim Cardiac Imaging Nurse [place] and V[facility] Services Direct Office Dial: [phone]   For scheduling needs, including cancellations and rescheduling, please call Vivien Rota at [phone], option [phone].      Follow-Up: At [facility], you and your health needs are our priority.  As part of our continuing mission to provide you with exceptional heart care, we have created designated Provider Care Teams.  These Care Teams include your  primary Cardiologist (physician) and Advanced Practice Providers (APPs -  Physician Assistants and Nurse Practitioners) who all work together to provide you with the care you need, when you need it.  We recommend signing up for the patient portal called "MyChart".  Sign up information is provided on this After Visit Summary.  MyChart is used to connect with  patients for Virtual Visits (Telemedicine).  Patients are able to view lab/test results, encounter notes, upcoming appointments, etc.  Non-urgent messages can be sent to your provider as well.   To learn more about what you can do with MyChart, go to [email or web address].    Your next appointment:   8 week(s)  The format for your next appointment:   In Person  Provider:   Shirlee More, MD   Other Instructions

## 2019-10-05 ENCOUNTER — Other Ambulatory Visit: Payer: Self-pay

## 2019-10-05 DIAGNOSIS — R9439 Abnormal result of other cardiovascular function study: Secondary | ICD-10-CM

## 2019-10-06 ENCOUNTER — Telehealth (HOSPITAL_COMMUNITY): Payer: Self-pay | Admitting: *Deleted

## 2019-10-06 ENCOUNTER — Telehealth: Payer: Self-pay

## 2019-10-06 LAB — BASIC METABOLIC PANEL
BUN/Creatinine Ratio: 16 (ref 10–24)
BUN: 21 mg/dL (ref 8–27)
CO2: 28 mmol/L (ref 20–29)
Calcium: 9.5 mg/dL (ref 8.6–10.2)
Chloride: 101 mmol/L (ref 96–106)
Creatinine, Ser: 1.3 mg/dL — ABNORMAL HIGH (ref 0.76–1.27)
GFR calc Af Amer: 67 mL/min/{1.73_m2} (ref >59)
GFR calc non Af Amer: 58 mL/min/{1.73_m2} — ABNORMAL LOW (ref >59)
Glucose: 99 mg/dL (ref 65–99)
Potassium: 4.5 mmol/L (ref 3.5–5.2)
Sodium: 142 mmol/L (ref 134–144)

## 2019-10-06 NOTE — Telephone Encounter (Signed)

## 2019-10-06 NOTE — Telephone Encounter (Signed)
Spoke with patient regarding results and recommendation.  Patient verbalizes understanding and is agreeable to plan of care. Advised patient to call back with any issues or concerns.  

## 2019-10-06 NOTE — Telephone Encounter (Signed)
-----   Message from Berniece Salines, DO sent at 10/06/2019  8:25 AM EDT ----- Creatinine slightly elevated, 1.30 appears to be closer to your baseline.  Try to stay hydrated.  We will continue to monitor this.  All of your electrolytes are within normal

## 2019-10-09 ENCOUNTER — Telehealth: Payer: Self-pay | Admitting: *Deleted

## 2019-10-09 NOTE — Telephone Encounter (Signed)
Spoke to patient and was advised that the dizziness is not every morning but happens more often than not. Patient stated that he was dizzy last week and fell, but fell on a stack of clothes. Patient stated that he has been taking the Losartan and HCTZ both at night. Patient stated that he has learned to get up slowly in the morning and move around slowly when he is dizzy. Tried to move patient;s appointment up but Dr. Einar Pheasant does not have anything Tuesday. Patient stated that his wife is scheduled for surgery Friday and they have to be in quarantine starting Wednesday.  After speaking to Tor Netters NP patient was advised to start taking  HCTZ in the mornings. Patient was advised to drink plenty of water and stay hydrated. Advised patient that message will go to Dr. Einar Pheasant so that she will be aware of what is going on with him and see what she recommends.

## 2019-10-09 NOTE — Telephone Encounter (Signed)
Advised pt of PCP msg. Pt can check BP at home. Pt is going to try HCTZ in the AM and keep journal of BP readings. Advised pt there are open apt on 8/12 if he had any increased BP or dizziness to contact office so a sooner apt can be made. If dizziness improves continue BP monitoring and bring readings with him to apt on 8/16.pt verbalized understanding.

## 2019-10-09 NOTE — Telephone Encounter (Signed)
Is he able to check blood pressure at home? Please have home monitor home BP and bring to appointment or call with numbers if dizziness does not improve based on Debbie's suggestions.

## 2019-10-09 NOTE — Telephone Encounter (Signed)
Will you please call and triage him? He has an upcoming appointment with Dr. Einar Pheasant in one week. Can he wait until then for medication adjustment if needed? He needs to be seen in office prior to medication change being made.

## 2019-10-09 NOTE — Telephone Encounter (Signed)
Patient left a voicemail stating that he is on two blood pressure medications. Patient stated that he has notified in the last 1 1/2-2 weeks that he wakes up with dizziness in the mornings. Patient wants to know if Dr. Einar Pheasant can switch him to something different that may not cause this dizziness.Marland Kitchen

## 2019-10-10 ENCOUNTER — Other Ambulatory Visit: Payer: Self-pay

## 2019-10-10 ENCOUNTER — Encounter (HOSPITAL_COMMUNITY): Payer: Self-pay

## 2019-10-10 ENCOUNTER — Encounter: Payer: BC Managed Care – PPO | Admitting: *Deleted

## 2019-10-10 ENCOUNTER — Ambulatory Visit (HOSPITAL_COMMUNITY)
Admission: RE | Admit: 2019-10-10 | Discharge: 2019-10-10 | Disposition: A | Discharge status: Home / Self Care | Payer: BC Managed Care – PPO | Source: Ambulatory Visit | Attending: Cardiology | Admitting: Cardiology

## 2019-10-10 ENCOUNTER — Telehealth: Payer: Self-pay

## 2019-10-10 DIAGNOSIS — Z006 Encounter for examination for normal comparison and control in clinical research program: Secondary | ICD-10-CM

## 2019-10-10 DIAGNOSIS — R9439 Abnormal result of other cardiovascular function study: Secondary | ICD-10-CM | POA: Diagnosis present

## 2019-10-10 DIAGNOSIS — R9430 Abnormal result of cardiovascular function study, unspecified: Secondary | ICD-10-CM

## 2019-10-10 MED ORDER — NITROGLYCERIN 0.4 MG SL SUBL
SUBLINGUAL_TABLET | SUBLINGUAL | Status: AC
  Filled 2019-10-10: qty 2

## 2019-10-10 MED ORDER — NITROGLYCERIN 0.4 MG SL SUBL
0.8000 mg | SUBLINGUAL_TABLET | Freq: Once | SUBLINGUAL | Status: AC
  Administered 2019-10-10: 0.8 mg via SUBLINGUAL

## 2019-10-10 MED ORDER — IOHEXOL 350 MG/ML SOLN
80.0000 mL | Freq: Once | INTRAVENOUS | Status: AC | PRN
  Administered 2019-10-10: 80 mL via INTRAVENOUS

## 2019-10-10 NOTE — Progress Notes (Signed)
Patient tolerated CT well. Drank a soda and ate cookies after. Ambulated to exit steady gait.

## 2019-10-10 NOTE — Telephone Encounter (Signed)
Spoke with patient regarding results and recommendation.  Patient verbalizes understanding and is agreeable to plan of care. Advised patient to call back with any issues or concerns.  

## 2019-10-10 NOTE — Research (Signed)
Subject Name: Casey PREWITT Sr.  Subject met inclusion and exclusion criteria.  The informed consent form, study requirements and expectations were reviewed with the subject and questions and concerns were addressed prior to the signing of the consent form.  The subject verbalized understanding of the trial requirements.  The subject agreed to participate in the [facility] 4 trial and signed the informed consent at [facility] on 10/10/19  The informed consent was obtained prior to performance of any protocol-specific procedures for the subject.  A copy of the signed informed consent was given to the subject and a copy was placed in the subject's medical record.   Timoteo Gaul

## 2019-10-16 ENCOUNTER — Other Ambulatory Visit: Payer: Self-pay

## 2019-10-16 ENCOUNTER — Encounter: Payer: Self-pay | Admitting: Family Medicine

## 2019-10-16 ENCOUNTER — Ambulatory Visit: Payer: BC Managed Care – PPO | Admitting: Family Medicine

## 2019-10-16 VITALS — BP 138/90 | HR 85 | Temp 97.7°F | Ht 65.0 in | Wt 217.2 lb

## 2019-10-16 DIAGNOSIS — F5101 Primary insomnia: Secondary | ICD-10-CM | POA: Diagnosis not present

## 2019-10-16 DIAGNOSIS — G8929 Other chronic pain: Secondary | ICD-10-CM

## 2019-10-16 DIAGNOSIS — M545 Low back pain, unspecified: Secondary | ICD-10-CM

## 2019-10-16 DIAGNOSIS — I10 Essential (primary) hypertension: Secondary | ICD-10-CM

## 2019-10-16 MED ORDER — CLONAZEPAM 2 MG PO TABS: 2.0000 mg | ORAL_TABLET | Freq: Every day | ORAL | 0 refills | Status: DC

## 2019-10-16 NOTE — Patient Instructions (Addendum)
Curcumin or Tumeric  Research has shown that Curcumin (Tumeric) supplements are just as good as high dose anti-inflammatory medications (like diclofenac and ibuprofen) at treating pain from osteoarthritis without the side effects.   Take Curcumin 500 mg Three times daily   -- You can find a bottle at [employer] for $8 which should last a month -- [place] is around $9 and is available at [employer]   #back - try tumeric as above - try taking tylenol 1000 mg three times daily  - try to decrease or stop Naproxen    #Sleep - continue current treatment - call for therapy - referral placed  #Referral I have placed a referral to a specialist for you. You should receive a phone call from the specialty office. Make sure your voicemail is not full and that if you are able to answer your phone to unknown or new numbers.   It may take up to 2 weeks to hear about the referral. If you do not hear anything in 2 weeks, please call our office and ask to speak with the referral coordinator.    #Blood pressure - Check Blood pressure a few times a week - if you noticed >140/90 reach out  - otherwise continue current medication

## 2019-10-16 NOTE — Progress Notes (Signed)
Subjective:     Casey [name]. is a 64 y.o. male presenting for Follow-up (2 month for hypertension )     HPI   #HTN - no cp, sob, ha - taking losartan and HCTZ - home readings are 130s/80s - wife just had her breast surgery and has had more pain - dizziness has improved  #Insomnia - has not been able to connect with a therapist - taking trazodone and clonazepam - previously could take medication and feel drowsy and if he went to sleep w/in 30 minutes then would be able to go to bed - no longer feeling drowsy on his medication and will not fall asleep until 3-4 am and will sleep until 10-11 am  - had tried waking up early (8 am) and would not fall asleep earlier at night - was on 3 mg of clonazepam and has self tapered to 2 mg -   #Back pain - recent injections w/ improvement - using Naproxen twice daily   Review of Systems  08/22/2019: Clinic - HTN - elevated start losartan followed by HCTZ if not improving. Cardiology referral. SOB - normal EKG - cardiology  09/11/2019: Cards - cardiac CTA (normal) 10/09/2019: Telephone - dizziness in the AM - HCTZ in the morning and hydration  Social History   Tobacco Use  Smoking Status Never Smoker  Smokeless Tobacco Never Used        Objective:    BP Readings from Last 3 Encounters:  10/16/19 138/90  10/10/19 120/74  09/11/19 138/88   Wt Readings from Last 3 Encounters:  10/16/19 217 lb 4 oz (98.5 kg)  09/11/19 215 lb 1.9 oz (97.6 kg)  08/22/19 211 lb 8 oz (95.9 kg)    BP 138/90   Pulse 85   Temp 97.7 F (36.5 C) (Temporal)   Ht 5\' 5"  (1.651 m)   Wt 217 lb 4 oz (98.5 kg)   SpO2 95%   BMI 36.15 kg/m    Physical Exam Constitutional:      Appearance: Normal appearance. He is not ill-appearing or diaphoretic.  HENT:     Right Ear: External ear normal.     Left Ear: External ear normal.     Nose: Nose normal.  Eyes:     General: No scleral icterus.    Extraocular Movements: Extraocular movements  intact.     Conjunctiva/sclera: Conjunctivae normal.  Cardiovascular:     Rate and Rhythm: Normal rate and regular rhythm.     Heart sounds: No murmur heard.   Pulmonary:     Effort: Pulmonary effort is normal. No respiratory distress.     Breath sounds: Normal breath sounds. No wheezing.  Musculoskeletal:     Cervical back: Neck supple.  Skin:    General: Skin is warm and dry.  Neurological:     Mental Status: He is alert. Mental status is at baseline.  Psychiatric:        Mood and Affect: Mood normal.           Assessment & Plan:   Problem List Items Addressed This Visit      Cardiovascular and Mediastinum   HTN (hypertension) - Primary    BP slightly elevated but normal at home and given dizziness (resolved) will continue current treatment. Return in 3-6 months.         Other   Insomnia    Pt with long hx of chronic symptoms and now worsening already on 2 mg clonazepam and trazodone  200 mg. Discussed that given likely need for continued clonazepam (would need wean) would prefer sleep specialist due to risk for oversedation. Number given for therapy as he has never tried and feel this could be helpful. Referral to [facility] neuro as already saw [facility] neuro w/o improvement.       Relevant Medications   clonazePAM (KLONOPIN) 2 MG tablet   Other Relevant Orders   Ambulatory referral to Neurology   Chronic back pain    Advised trial of tumeric and to try and decrease NSAID use given HTN. He will try.       Relevant Medications   clonazePAM (KLONOPIN) 2 MG tablet       Return in about 3 months (around 01/16/2020).  Lesleigh Noe, MD  This visit occurred during the SARS-CoV-2 public health emergency.  Safety protocols were in place, including screening questions prior to the visit, additional usage of staff PPE, and extensive cleaning of exam room while observing appropriate contact time as indicated for disinfecting solutions.

## 2019-10-16 NOTE — Assessment & Plan Note (Signed)
Pt with long hx of chronic symptoms and now worsening already on 2 mg clonazepam and trazodone 200 mg. Discussed that given likely need for continued clonazepam (would need wean) would prefer sleep specialist due to risk for oversedation. Number given for therapy as he has never tried and feel this could be helpful. Referral to [facility] neuro as already saw [facility] neuro w/o improvement.

## 2019-10-16 NOTE — Assessment & Plan Note (Signed)
Advised trial of tumeric and to try and decrease NSAID use given HTN. He will try.

## 2019-10-16 NOTE — Assessment & Plan Note (Signed)
BP slightly elevated but normal at home and given dizziness (resolved) will continue current treatment. Return in 3-6 months.

## 2019-10-25 ENCOUNTER — Other Ambulatory Visit: Payer: Self-pay

## 2019-10-25 NOTE — Telephone Encounter (Signed)
Last OV" 10/16/19 for hypertension Last refill: 12/21/18 #90 w/ 0  Next OV: 01/11/20

## 2019-10-26 MED ORDER — PANTOPRAZOLE SODIUM 40 MG PO TBEC: 40.0000 mg | DELAYED_RELEASE_TABLET | Freq: Every day | ORAL | 0 refills | Status: DC

## 2019-11-15 ENCOUNTER — Ambulatory Visit: Payer: BC Managed Care – PPO | Admitting: Cardiology

## 2020-01-11 ENCOUNTER — Other Ambulatory Visit: Payer: Self-pay

## 2020-01-11 ENCOUNTER — Ambulatory Visit (INDEPENDENT_AMBULATORY_CARE_PROVIDER_SITE_OTHER): Payer: Medicare HMO | Admitting: Family Medicine

## 2020-01-11 VITALS — BP 118/72 | HR 64 | Temp 97.8°F | Ht 65.0 in | Wt 207.2 lb

## 2020-01-11 DIAGNOSIS — Z23 Encounter for immunization: Secondary | ICD-10-CM | POA: Diagnosis not present

## 2020-01-11 DIAGNOSIS — M545 Low back pain, unspecified: Secondary | ICD-10-CM | POA: Diagnosis not present

## 2020-01-11 DIAGNOSIS — G2581 Restless legs syndrome: Secondary | ICD-10-CM | POA: Diagnosis not present

## 2020-01-11 DIAGNOSIS — I10 Essential (primary) hypertension: Secondary | ICD-10-CM | POA: Diagnosis not present

## 2020-01-11 DIAGNOSIS — G4701 Insomnia due to medical condition: Secondary | ICD-10-CM

## 2020-01-11 DIAGNOSIS — G8929 Other chronic pain: Secondary | ICD-10-CM

## 2020-01-11 NOTE — Progress Notes (Signed)
Subjective:     Casey [name]. is a 64 y.o. male presenting for Follow-up (3 month )     HPI  #HTN - blood pressure is looking good - has lost 10 lbs - watching what he eats, reducing fried foods and sugar  #Insomnia - sleep is improving - going to bed earlier - sleeping better - continues to take medication at night - doing well on clonazepam 2 mg  - and also taking trazodone - gets 8-9 hours  - not waking up every night - back pain is improving - so this has helped  Review of Systems   Social History   Tobacco Use  Smoking Status Never Smoker  Smokeless Tobacco Never Used        Objective:    BP Readings from Last 3 Encounters:  01/11/20 118/72  10/16/19 138/90  10/10/19 120/74   Wt Readings from Last 3 Encounters:  01/11/20 207 lb 4 oz (94 kg)  10/16/19 217 lb 4 oz (98.5 kg)  09/11/19 215 lb 1.9 oz (97.6 kg)    BP 118/72   Pulse 64   Temp 97.8 F (36.6 C) (Temporal)   Ht 5\' 5"  (1.651 m)   Wt 207 lb 4 oz (94 kg)   SpO2 96%   BMI 34.49 kg/m    Physical Exam Constitutional:      Appearance: Normal appearance. He is not ill-appearing or diaphoretic.  HENT:     Right Ear: External ear normal.     Left Ear: External ear normal.     Nose: Nose normal.  Eyes:     General: No scleral icterus.    Extraocular Movements: Extraocular movements intact.     Conjunctiva/sclera: Conjunctivae normal.  Cardiovascular:     Rate and Rhythm: Normal rate and regular rhythm.     Heart sounds: No murmur heard.   Pulmonary:     Effort: Pulmonary effort is normal. No respiratory distress.     Breath sounds: Normal breath sounds. No wheezing.  Musculoskeletal:     Cervical back: Neck supple.  Skin:    General: Skin is warm and dry.  Neurological:     Mental Status: He is alert. Mental status is at baseline.  Psychiatric:        Mood and Affect: Mood normal.           Assessment & Plan:   Problem List Items Addressed This Visit       Cardiovascular and Mediastinum   HTN (hypertension) - Primary    BP improved with 10 lb weight loss. Cont healthy diet/exercise. Cont Losartan 50 mg daily and HCTZ 12.5 mg        Other   Insomnia secondary to restless leg syndrome    Was not able to do therapy or neurology referral. Hx of sleep apnea but not longer using CPAP - he has lost weight and notes improvement in sleep. Cont Trazodone 200 mg at night. Work to reduce Clonazepam 2 mg > he is going to try doing 1 mg every other night, but discussed if experiencing withdrawal would recommend decreasing to 1.5 mg and will send in pills to be able to do this.       Chronic back pain    Follows with orthopedic. Cont tumeric and tramadol for pain control. Notes improvement      Relevant Medications   acetaminophen (TYLENOL) 500 MG tablet       Return in about 6 months (around 07/10/2020)  for Medicare Wellness visit.  Lesleigh Noe, MD  This visit occurred during the SARS-CoV-2 public health emergency.  Safety protocols were in place, including screening questions prior to the visit, additional usage of staff PPE, and extensive cleaning of exam room while observing appropriate contact time as indicated for disinfecting solutions.

## 2020-01-11 NOTE — Assessment & Plan Note (Signed)
Was not able to do therapy or neurology referral. Hx of sleep apnea but not longer using CPAP - he has lost weight and notes improvement in sleep. Cont Trazodone 200 mg at night. Work to reduce Clonazepam 2 mg > he is going to try doing 1 mg every other night, but discussed if experiencing withdrawal would recommend decreasing to 1.5 mg and will send in pills to be able to do this.

## 2020-01-11 NOTE — Assessment & Plan Note (Signed)
BP improved with 10 lb weight loss. Cont healthy diet/exercise. Cont Losartan 50 mg daily and HCTZ 12.5 mg

## 2020-01-11 NOTE — Assessment & Plan Note (Signed)
Follows with orthopedic. Cont tumeric and tramadol for pain control. Notes improvement

## 2020-01-11 NOTE — Patient Instructions (Signed)
#  Sleep - work on cutting back Clonazepam to 1 mg (1/2 pill) - if having withdrawal or difficulty sleeping -- call and I can send in 0.5 mg tablet so you can try 1.5 mg dosing   Blood pressure looks great!

## 2020-01-16 ENCOUNTER — Other Ambulatory Visit: Payer: Self-pay | Admitting: Family Medicine

## 2020-01-18 DIAGNOSIS — H524 Presbyopia: Secondary | ICD-10-CM | POA: Diagnosis not present

## 2020-02-08 ENCOUNTER — Other Ambulatory Visit: Payer: Self-pay | Admitting: Family Medicine

## 2020-02-21 ENCOUNTER — Telehealth: Payer: Self-pay

## 2020-02-21 DIAGNOSIS — Z20822 Contact with and (suspected) exposure to covid-19: Secondary | ICD-10-CM | POA: Diagnosis not present

## 2020-02-21 DIAGNOSIS — J069 Acute upper respiratory infection, unspecified: Secondary | ICD-10-CM

## 2020-02-21 DIAGNOSIS — Z03818 Encounter for observation for suspected exposure to other biological agents ruled out: Secondary | ICD-10-CM | POA: Diagnosis not present

## 2020-02-21 NOTE — Telephone Encounter (Signed)
I spoke with pt; pt is on his way to UC in [place] now. FYI to Dr Einar Pheasant.

## 2020-02-21 NOTE — Telephone Encounter (Signed)
Pt said he got rapid covid test results 02/21/20 and they were neg. Pt has dry cough that kept pt up last night and pt is coughing a lot today also. Pt has been taking benzonatate which is helping the cough slightly but pt thinks needs something stronger; pt is also taking dextromethorphan but with no relief from cough. Pt temp is 99.5. no SOB or CP. [facility]. Pt request cb when med called in.(pt is allergic to codeine and hydrocodone).

## 2020-02-21 NOTE — Telephone Encounter (Signed)
[facility] Day - Client TELEPHONE ADVICE RECORD [facility] Patient Name: Casey [name] Gender: Male DOB: 05-Dec-1955 Age: 64 Y 3 M 14 D Return Phone Number: [phone] (Secondary), [phone] (Alternate) Address: [place] [address] Client [name] [facility] - Day Physician Gweneth Dimitri- MD Contact Type Call Who Is Calling Patient / Member / Family / Caregiver Call Type Triage / Clinical Caller Name Kollen Armenti Relationship To Patient Spouse Return Phone Number [phone] (Secondary) Chief Complaint Fever (non urgent symptom) (> THREE MONTHS) Reason for Call Symptomatic / Request for Health Information Initial Comment Her husband has fever 100, cough Translation No Nurse Assessment Nurse: Wyn Quaker, RN, Marylene Land Date/Time (Eastern Time): 02/21/2020 10:21:43 AM Confirm and document reason for call. If symptomatic, describe symptoms. ---Caller stated that he has had runny nose, cough, fever 100, feels bad. All s/s started yesterday afternoon. Does the patient have any new or worsening symptoms? ---Yes Will a triage be completed? ---Yes Related visit to physician within the last 2 weeks? ---No Does the PT have any chronic conditions? (i.e. diabetes, asthma, this includes High risk factors for pregnancy, etc.) ---Yes List chronic conditions. ---HTN, insomnia, GERD Is this a behavioral health or substance abuse call? ---No Guidelines Guideline Title Affirmed Question Affirmed Notes Nurse Date/Time (Eastern Time) COVID-19 - Diagnosed or Suspected HIGH RISK for severe COVID complications (e.g., age > 64 years, obesity with BMI > 25, pregnant, chronic lung disease or other chronic medical condition) (Exception: Already seen by PCP and no new or worsening symptoms.) Wyn Quaker, RN, Marylene Land 02/21/2020 10:23:26 AM Disp. Time [name] Time) Disposition Final User 02/21/2020  10:00:12 AM Attempt made - message left Dew, RN, Marylene Land PLEASE NOTE: All timestamps contained within this report are represented as [place] Standard Time. CONFIDENTIALTY NOTICE: This fax transmission is intended only for the addressee. It contains information that is legally privileged, confidential or otherwise protected from use or disclosure. If you are not the intended recipient, you are strictly prohibited from reviewing, disclosing, copying using or disseminating any of this information or taking any action in reliance on or regarding this information. If you have received this fax in error, please notify us immediately by telephone so that we can arrange for its return to [place]. Phone: [phone], Toll-Free: [phone], Fax: [phone] Page: 2 of 2 Call Id: [id number] 02/21/2020 10:26:20 AM Call PCP Now Yes Wyn Quaker, RN, Rosalyn Charters Disagree/Comply Comply Caller Understands Yes PreDisposition Go to ED Care Advice Given Per Guideline CALL PCP NOW: * You need to discuss this with your doctor (or NP/PA). * I'll page the on-call provider now. If you haven't heard from the provider (or me) within 30 minutes, call again. HOW TO PROTECT OTHERS - WHEN YOU ARE SICK WITH COVID-19: * STAY HOME A MINIMUM OF 10 DAYS: Home isolation is needed for at least 10 days after the symptoms started. Stay home from school or work if you are sick. Do NOT go to religious services, child care centers, shopping, or other public places. Do NOT use public transportation (e.g., bus, taxis, ride-sharing). Do NOT allow any visitors to your home. Leave the house only if you need to seek urgent medical care. * COVER THE COUGH: Cough and sneeze into your shirt sleeve or inner elbow. Don't cough into your hand or the air. If available, cough into a tissue and throw it into a trash can. * WASH HANDS OFTEN: Wash hands often with soap and water.  After coughing or sneezing are important times. If soap and water are not available,  use an alcohol-based hand sanitizer with at least 60% alcohol, covering all surfaces of your hands and rubbing them together until they feel dry. Avoid touching your eyes, nose, and mouth with unwashed hands. * WEAR A MASK: Wear a facemask when around others. Always wear a facemask (if available) if you have to leave your home (such as going to a medical facility). * CALL AHEAD IF MEDICAL CARE NEEDED: If you have a medical appointment, call your doctor's office and tell them you have or may have COVID-19. This will help the office protect themselves and other patients. They will give you directions. GENERAL CARE ADVICE FOR COVID-19 SYMPTOMS: * The treatment is the same whether you have COVID-19, influenza or some other respiratory virus. * Cough: Use cough drops. * Feeling dehydrated: Drink extra liquids. If the air in your home is dry, use a humidifier. * Fever: For fever over 101 F (38.3 C), take acetaminophen every 4 to 6 hours (Adults 650 mg) OR ibuprofen every 6 to 8 hours (Adults 400 mg). Before taking any medicine, read all the instructions on the package. Do not take aspirin unless your doctor has prescribed it for you. * Muscle aches, headache, and other pains: Often this comes and goes with the fever. Take acetaminophen every 4 to 6 hours (Adults 650 mg) OR ibuprofen every 6 to 8 hours (Adults 400 mg). Before taking any medicine, read all the instructions on the package. * Sore throat: Try throat lozenges, hard candy or warm chicken broth. CALL BACK IF: * You become worse CARE ADVICE given per COVID-19 - DIAGNOSED OR SUSPECTED (Adult) guideline. Comments User: Raford Pitcher, RN Date/Time Eilene Ghazi Time): 02/21/2020 10:31:13 AM I spoke with Raquel Sarna with the office. She wants pt to go to an [facility] for eval and tx because the office does not have any app't. Referrals GO TO FACILITY UNDECIDED Warm transfer to backline

## 2020-02-21 NOTE — Telephone Encounter (Signed)
Agree with evaluation. Will f/u visit.

## 2020-02-22 ENCOUNTER — Telehealth: Payer: Self-pay | Admitting: Family Medicine

## 2020-02-22 MED ORDER — PROMETHAZINE-DM 6.25-15 MG/5ML PO SYRP: 5.0000 mL | ORAL_SOLUTION | Freq: Four times a day (QID) | ORAL | 0 refills | Status: DC | PRN

## 2020-02-22 NOTE — Addendum Note (Signed)
Addended by: Lesleigh Noe on: 02/22/2020 07:45 AM   Modules accepted: Orders

## 2020-02-22 NOTE — Telephone Encounter (Signed)
Left message for Casey [name] that Dr. Einar Pheasant [facility] [place]  I also advised him that Dr. Einar Pheasant also recommend that he try some honey.

## 2020-02-22 NOTE — Telephone Encounter (Signed)
Patient called in relayed the message from Butch Penny about prescription being called in. EM

## 2020-02-22 NOTE — Telephone Encounter (Signed)
Cough syrup sent to pharmacy  Would also recommend trying honey

## 2020-02-26 ENCOUNTER — Other Ambulatory Visit: Payer: Self-pay | Admitting: Family Medicine

## 2020-02-26 DIAGNOSIS — J069 Acute upper respiratory infection, unspecified: Secondary | ICD-10-CM

## 2020-02-26 MED ORDER — PROMETHAZINE-DM 6.25-15 MG/5ML PO SYRP
5.0000 mL | ORAL_SOLUTION | Freq: Four times a day (QID) | ORAL | 0 refills | Status: DC | PRN
Start: 2020-02-26 — End: 2020-03-04

## 2020-02-26 NOTE — Telephone Encounter (Signed)
Pt called requesting refill for the phenergan cough med; already denied; pt has video visit scheduled for 02/27/20 at 11 AM with DR Tower. If pt feels worse tonight and needs cough med pt will go to UC. Pt has one dose for tonight for cough med. If pt goes to UC he will call and cancel appt on 02/27/20.FYI to Dr Milinda Antis.

## 2020-02-26 NOTE — Addendum Note (Signed)
Addended by: Roxy Manns A on: 02/26/2020 07:38 PM   Modules accepted: Orders

## 2020-02-26 NOTE — Telephone Encounter (Signed)
I sent it in  Will disc further at visit

## 2020-02-27 ENCOUNTER — Ambulatory Visit
Admission: RE | Admit: 2020-02-27 | Discharge: 2020-02-27 | Disposition: A | Discharge status: Home / Self Care | Payer: Medicare HMO | Source: Ambulatory Visit | Attending: Family Medicine | Admitting: Family Medicine

## 2020-02-27 ENCOUNTER — Telehealth (INDEPENDENT_AMBULATORY_CARE_PROVIDER_SITE_OTHER): Payer: Medicare HMO | Admitting: Family Medicine

## 2020-02-27 ENCOUNTER — Encounter: Payer: Self-pay | Admitting: Family Medicine

## 2020-02-27 ENCOUNTER — Other Ambulatory Visit: Payer: Self-pay

## 2020-02-27 ENCOUNTER — Other Ambulatory Visit: Payer: Self-pay | Admitting: Family Medicine

## 2020-02-27 DIAGNOSIS — J069 Acute upper respiratory infection, unspecified: Secondary | ICD-10-CM | POA: Insufficient documentation

## 2020-02-27 DIAGNOSIS — R059 Cough, unspecified: Secondary | ICD-10-CM | POA: Diagnosis not present

## 2020-02-27 MED ORDER — BENZONATATE 200 MG PO CAPS
200.0000 mg | ORAL_CAPSULE | Freq: Three times a day (TID) | ORAL | 1 refills | Status: DC | PRN
Start: 2020-02-27 — End: 2020-07-25

## 2020-02-27 MED ORDER — ALBUTEROL SULFATE HFA 108 (90 BASE) MCG/ACT IN AERS
2.0000 | INHALATION_SPRAY | RESPIRATORY_TRACT | 0 refills | Status: DC | PRN
Start: 2020-02-27 — End: 2020-03-05

## 2020-02-27 NOTE — Assessment & Plan Note (Signed)
One week of symptoms with bad cough (testing neg for flu and covid)  Taking prometh DM Is allergic to prednisone and codeine/hydrocodone  Adv fluids /lozenge prn Sent in tessalon for cough Also albuterol mdi to use prn tight chest and cough  cxr ordered at [facility]  Further plan to follow that result  inst to call/go to ER if sob or suddenly worse

## 2020-02-27 NOTE — Patient Instructions (Signed)
Drink fluids and rest  Try tessalon for cough (in addition to the prometh-DM)  Use albuterol inhaler for cough/wheeze or tight chest The office will call you to set up a chest xray   Update if not starting to improve in a week or if worsening  If suddenly worse please go to the ER

## 2020-02-27 NOTE — Progress Notes (Signed)
Virtual Visit via Telephone Note  I connected with Casey GERRITS Sr. on 02/27/20 at 11:00 AM EST by telephone and verified that I am speaking with the correct person using two identifiers.  Location: Patient: home Provider: office   I discussed the limitations, risks, security and privacy concerns of performing an evaluation and management service by telephone and the availability of in person appointments. I also discussed with the patient that there may be a patient responsible charge related to this service. The patient expressed understanding and agreed to proceed.  Parties involved in encounter  Patient: Casey [name]  Provider:  Loura Pardon MD    History of Present Illness: Pt presents with uri symptoms Started a week ago  Last Tuesday- developed a bad cough and malaise (keeping him up)  Low grade temp at times Cough is prod of phlegm - light grey  Hoarse voice   Ribs and stomach muscles hurt from cough  Scant wheeze if any  No sob   Had gagged from cough-no n/v/d otherwise  No change in taste or smell   No sinus pain or head congestion   Took otc cough med-did not work  We sent in prometh DM twice (helps some of the time)  He is allergic to prednisone and codeine and hydrocodone   He was tested for covid- negative last Thursday  Had both rapid test and 2 day test -both neg  Also negative flu test   Immunized for covid with booster    this is more cough than he usually gets with a cold  No sick contacts that he knows of  Family is not sick  He sanitizes everything and wears mask   Takes 2 tylenol three times daily  Zyrtec  emergen C   Patient Active Problem List   Diagnosis Date Noted  . Viral URI with cough 02/27/2020  . Leukocytosis 08/22/2019  . Shortness of breath 08/22/2019  . Chronic back pain 08/15/2019  . Benzodiazepine dependence ([place]) 08/15/2019  . HTN (hypertension) 03/24/2016  . GERD (gastroesophageal reflux disease)   . Insomnia with  sleep apnea 12/06/2013  . Insomnia secondary to restless leg syndrome 07/17/2013  . Osteoarthritis, multiple sites 04/26/2011  . Insomnia 04/26/2011   Past Medical History:  Diagnosis Date  . Allergy    seasonal  . Arthritis    mild  . Cancer ([place])    skin  . Cardiac arrest ([place])   . Circadian rhythm sleep disorder, shift work type 07/17/2013  . Complication of anesthesia    nausea and vomiting  . GERD (gastroesophageal reflux disease)   . Hypertension   . Insomnia secondary to restless leg syndrome 07/17/2013  . Kidney stones   . OSA on CPAP   . PONV (postoperative nausea and vomiting)   . Sleep apnea    cpap   Past Surgical History:  Procedure Laterality Date  . acl repair left knee    . APPENDECTOMY    . BACK SURGERY  2017   nerve blocks  . CARDIAC CATHETERIZATION  2005   negative   . CHOLECYSTECTOMY    . CYSTOSCOPY/RETROGRADE/URETEROSCOPY/STONE EXTRACTION WITH BASKET  04/07/2012   Procedure: CYSTOSCOPY/RETROGRADE/URETEROSCOPY/STONE EXTRACTION WITH BASKET;  Surgeon: Malka So, MD;  Location: [facility];  Service: Urology;  Laterality: Bilateral;  cystoscopy, bilateral stent removal, bilateral ureteroscopy with bilateral stone extraction and insertion of right ureteral stent  . fusion great toe right foot    . ligaments repair left wrist     .  ROTATOR CUFF REPAIR     left shoulder   . skin cancer removed from left cheek    . stone extractions and urethral stents     multiple  . tendons cut on left heel    . TONSILLECTOMY    . VARICOCELE EXCISION     Social History   Tobacco Use  . Smoking status: Never Smoker  . Smokeless tobacco: Never Used  Substance Use Topics  . Alcohol use: No    Alcohol/week: 0.0 standard drinks  . Drug use: No   Family History  Problem Relation Age of Onset  . Heart disease Mother        CABG  . Bladder Cancer Mother        bladder cancer  . Hypertension Father   . Lung cancer Father        lung cancer after asbestosis  .  Diabetes Brother   . Alcohol abuse Brother   . Depression Brother   . Hypertension Brother   . Kidney disease Brother   . Heart disease Maternal Grandmother   . Heart attack Maternal Grandfather   . Stroke Maternal Grandfather   . Bone cancer Paternal Grandfather   . Colon cancer Neg Hx    Allergies  Allergen Reactions  . Prednisone Other (See Comments)    Severe stomach pain  . Codeine Itching  . Hydrocodone Itching   Current Outpatient Medications on File Prior to Visit  Medication Sig Dispense Refill  . acetaminophen (TYLENOL) 500 MG tablet Take 1,000 mg by mouth in the morning, at noon, and at bedtime.    . clonazePAM (KLONOPIN) 2 MG tablet Take 1 tablet (2 mg total) by mouth at bedtime. 90 tablet 0  . hydrochlorothiazide (HYDRODIURIL) 12.5 MG tablet Take 1 tablet (12.5 mg total) by mouth daily. 90 tablet 3  . losartan (COZAAR) 50 MG tablet Take 1 tablet (50 mg total) by mouth daily. 90 tablet 3  . pantoprazole (PROTONIX) 40 MG tablet TAKE 1 TABLET BY MOUTH EVERY DAY 90 tablet 0  . Probiotic Product (PROBIOTIC DAILY PO) Take 1 capsule by mouth daily. PB8 Probiotic    . promethazine-dextromethorphan (PROMETHAZINE-DM) 6.25-15 MG/5ML syrup Take 5 mLs by mouth 4 (four) times daily as needed for cough. 118 mL 0  . traMADol (ULTRAM) 50 MG tablet TAKE 1 TABLET (50 MG TOTAL) BY MOUTH 2 (TWO) TIMES DAILY AS NEEDED 15 tablet 0  . traZODone (DESYREL) 100 MG tablet TAKE 2 TABLETS BY MOUTH AT BEDTIME 180 tablet 3  . TURMERIC PO Take by mouth in the morning, at noon, and at bedtime.     No current facility-administered medications on file prior to visit.   Review of Systems  Constitutional: Positive for malaise/fatigue. Negative for chills and fever.  HENT: Positive for congestion. Negative for ear pain, sinus pain and sore throat.   Eyes: Negative for blurred vision, discharge and redness.  Respiratory: Positive for cough and sputum production. Negative for shortness of breath, wheezing  and stridor.   Cardiovascular: Negative for chest pain, palpitations and leg swelling.  Gastrointestinal: Negative for abdominal pain, diarrhea, nausea and vomiting.  Musculoskeletal: Negative for myalgias.  Skin: Negative for rash.  Neurological: Negative for dizziness and headaches.      Observations/Objective: Pt sounds hoarse (also fatigued) occ dry cough heard  No audible sob or wheeze  Nl cognition- good historian Nl mood   Assessment and Plan: Problem List Items Addressed This Visit      Respiratory  Viral URI with cough    One week of symptoms with bad cough (testing neg for flu and covid)  Taking prometh DM Is allergic to prednisone and codeine/hydrocodone  Adv fluids /lozenge prn Sent in tessalon for cough Also albuterol mdi to use prn tight chest and cough  [facility] at [facility]  Further plan to follow that result  inst to call/go to ER if sob or suddenly worse          Follow Up Instructions: Drink fluids and rest  Try tessalon for cough (in addition to the prometh-DM)  Use albuterol inhaler for cough/wheeze or tight chest The office will call you to set up a chest xray   Update if not starting to improve in a week or if worsening  If suddenly worse please go to the ER   I discussed the assessment and treatment plan with the patient. The patient was provided an opportunity to ask questions and all were answered. The patient agreed with the plan and demonstrated an understanding of the instructions.   The patient was advised to call back or seek an in-person evaluation if the symptoms worsen or if the condition fails to improve as anticipated.  I provided 17 minutes of non-face-to-face time during this encounter.   Loura Pardon, MD

## 2020-02-28 ENCOUNTER — Encounter: Payer: Self-pay | Admitting: Family Medicine

## 2020-02-28 NOTE — Telephone Encounter (Addendum)
Dr. Milinda Antis sent a message saying: Can an injection be done by car?  What doses of solu medrol do we have available?  Thanks      Spoke with [place] and we would have to have pt come in at the end of the day tomorrow, less pt and have the "tester" give him the injection. Depo medrol has to be given in his hip/bottom so we can't do it care side pt has to come into the office. Also given history of prednisone reaction pt would need to be monitored for at least 10 min after inj to make sure no reactions. Angelica Chessman said since Dr. Milinda Antis is off on Thursday afternoons that PCP would be notified if there is any issues with injection. So will c.c. Dr. Selena Batten so she is aware. Also CC to [facility] since she is the PM tester and per [facility] PM tester can admin injection   Called pt and advise him of this plan, nurse visit schedule tomorrow at 4pm  Dr. Milinda Antis we have 40mg /ml and 80mg /ml doses of depo medrol

## 2020-02-29 ENCOUNTER — Ambulatory Visit (INDEPENDENT_AMBULATORY_CARE_PROVIDER_SITE_OTHER): Payer: Medicare HMO

## 2020-02-29 DIAGNOSIS — J069 Acute upper respiratory infection, unspecified: Secondary | ICD-10-CM | POA: Diagnosis not present

## 2020-02-29 MED ORDER — METHYLPREDNISOLONE ACETATE 40 MG/ML IJ SUSP
40.0000 mg | Freq: Once | INTRAMUSCULAR | Status: AC
Start: 2020-02-29 — End: 2020-02-29
  Administered 2020-02-29: 40 mg via INTRAMUSCULAR

## 2020-02-29 NOTE — Progress Notes (Signed)
Per orders of Dr. Gweneth Dimitri , injection of Depo medrol 40mg  given by in left upper outer quadrant.  Patient tolerated injection well. Patient will let our office know if any changes in symptoms or concerns.

## 2020-02-29 NOTE — Telephone Encounter (Signed)
Found out Joellen is the tester today. Will route message to her so she is aware. Per Dr. Milinda Antis pt needs 40 mg of depo medrol

## 2020-03-04 ENCOUNTER — Telehealth: Payer: Self-pay | Admitting: *Deleted

## 2020-03-04 ENCOUNTER — Other Ambulatory Visit: Payer: Self-pay | Admitting: Family Medicine

## 2020-03-04 MED ORDER — PROMETHAZINE-DM 6.25-15 MG/5ML PO SYRP: 5.0000 mL | ORAL_SOLUTION | Freq: Four times a day (QID) | ORAL | 0 refills | Status: DC | PRN

## 2020-03-04 NOTE — Telephone Encounter (Signed)
Agree with Dr Elmyra Ricks advisement  Please keep [place] updated -esp re: temp and cough

## 2020-03-04 NOTE — Telephone Encounter (Signed)
Patient called stating that he has had bronchitis and his cough is still bad. Patient stated that he came in Thursday and got a prednisone shot. Patient stated that he thinks that the shot helped some but he still has a terrible cough. Patient stated that he was tested for covid 12 days ago and the results were negative. Patient stated that his temperature today is 99.2. Patient denies any SOB or difficulty breathing. Patient stated that the cough medication does help some and he is almost out of it. Patient wants to know what the next step would be since he is still sick. [facility]acy [facility]

## 2020-03-04 NOTE — Telephone Encounter (Signed)
CXR w/o signs of pneumonia. Viral coughs can linger 6 weeks. I have refilled the cough syrup  Will route to Dr. Milinda Antis to see if she has any other suggestions since she saw him.

## 2020-03-05 NOTE — Telephone Encounter (Signed)
Left VM letting pt know Dr. Cody's/ Dr. Royden Purl comments and instructions and that cough med was refilled

## 2020-03-05 NOTE — Telephone Encounter (Signed)
Pt called back and I advise him directly of Dr. Milinda Antis Dr. Elmyra Ricks comments

## 2020-03-07 ENCOUNTER — Other Ambulatory Visit: Payer: Self-pay | Admitting: Family Medicine

## 2020-03-07 DIAGNOSIS — F5101 Primary insomnia: Secondary | ICD-10-CM

## 2020-03-07 NOTE — Telephone Encounter (Signed)
Spoke with pt. States that he has gone back to taking 2mg  qhs since "he got sick." Pt is requesting to have the 2mg  tablets refilled at this time.

## 2020-03-07 NOTE — Telephone Encounter (Signed)
Please confirm what dose patient is taking.   He has been weaning at last appointment  Was decreasing to 1 mg every other night.   I discussed trying 1.5 mg if this was not tolerated as a wean.

## 2020-03-07 NOTE — Telephone Encounter (Signed)
Dr Cody's pt?

## 2020-03-07 NOTE — Telephone Encounter (Signed)
My apologies, routing to Dr. Selena Batten.

## 2020-03-16 DIAGNOSIS — Z0100 Encounter for examination of eyes and vision without abnormal findings: Secondary | ICD-10-CM | POA: Diagnosis not present

## 2020-04-26 ENCOUNTER — Other Ambulatory Visit: Payer: Self-pay | Admitting: Family Medicine

## 2020-06-04 ENCOUNTER — Other Ambulatory Visit: Payer: Self-pay | Admitting: Family Medicine

## 2020-06-04 DIAGNOSIS — F5101 Primary insomnia: Secondary | ICD-10-CM

## 2020-06-04 MED ORDER — CLONAZEPAM 2 MG PO TABS: 2.0000 mg | ORAL_TABLET | Freq: Every day | ORAL | 0 refills | Status: DC

## 2020-06-04 MED ORDER — CLONAZEPAM 2 MG PO TABS
2.0000 mg | ORAL_TABLET | Freq: Every day | ORAL | 0 refills | Status: DC
Start: 2020-06-04 — End: 2020-07-22

## 2020-06-04 NOTE — Telephone Encounter (Signed)
Previous visit had discussed trying to decrease to 1.5 mg at night.   30 day supply sent to pharmacy  Will need f/u visit for additional refills as last visit was 5 months ago

## 2020-06-04 NOTE — Addendum Note (Signed)
Addended by: Lesleigh Noe on: 06/04/2020 03:18 PM   Modules accepted: Orders

## 2020-07-19 ENCOUNTER — Telehealth: Payer: Self-pay

## 2020-07-19 DIAGNOSIS — F5101 Primary insomnia: Secondary | ICD-10-CM

## 2020-07-19 NOTE — Telephone Encounter (Signed)
Last OV: 01/11/20 Last refill: 06/04/20 #30 with 0 No future appts scheduled

## 2020-07-22 ENCOUNTER — Other Ambulatory Visit: Payer: Self-pay | Admitting: Family Medicine

## 2020-07-22 MED ORDER — CLONAZEPAM 2 MG PO TABS: 2.0000 mg | ORAL_TABLET | Freq: Every day | ORAL | 0 refills | Status: DC

## 2020-07-22 NOTE — Telephone Encounter (Signed)
He needs to come in for his 6 month refill f/u

## 2020-07-22 NOTE — Telephone Encounter (Signed)
Refill sent in for #10, glad he has been able to reduce his dose

## 2020-07-22 NOTE — Telephone Encounter (Signed)
Pt scheduled for 5/26 at 12:20. He states he will be out after tonight and is wondering if he could get enough to carry him through until his appointment. Please advise.   He states he has started taking half a pill instead of a full pill. He also said that his insurance will now cover a 100 day supply instead of a 90 day supply.

## 2020-07-22 NOTE — Addendum Note (Signed)
Addended by: Lesleigh Noe on: 07/22/2020 03:40 PM   Modules accepted: Orders

## 2020-07-25 ENCOUNTER — Other Ambulatory Visit: Payer: Self-pay

## 2020-07-25 ENCOUNTER — Ambulatory Visit (INDEPENDENT_AMBULATORY_CARE_PROVIDER_SITE_OTHER): Payer: Medicare HMO | Admitting: Family Medicine

## 2020-07-25 ENCOUNTER — Encounter: Payer: Self-pay | Admitting: Family Medicine

## 2020-07-25 VITALS — BP 130/80 | HR 76 | Temp 98.2°F | Wt 209.5 lb

## 2020-07-25 DIAGNOSIS — F5101 Primary insomnia: Secondary | ICD-10-CM

## 2020-07-25 DIAGNOSIS — I10 Essential (primary) hypertension: Secondary | ICD-10-CM

## 2020-07-25 DIAGNOSIS — M8949 Other hypertrophic osteoarthropathy, multiple sites: Secondary | ICD-10-CM | POA: Diagnosis not present

## 2020-07-25 DIAGNOSIS — M545 Low back pain, unspecified: Secondary | ICD-10-CM

## 2020-07-25 DIAGNOSIS — M25561 Pain in right knee: Secondary | ICD-10-CM | POA: Diagnosis not present

## 2020-07-25 DIAGNOSIS — G8929 Other chronic pain: Secondary | ICD-10-CM | POA: Diagnosis not present

## 2020-07-25 DIAGNOSIS — M25562 Pain in left knee: Secondary | ICD-10-CM

## 2020-07-25 DIAGNOSIS — K2100 Gastro-esophageal reflux disease with esophagitis, without bleeding: Secondary | ICD-10-CM

## 2020-07-25 DIAGNOSIS — R69 Illness, unspecified: Secondary | ICD-10-CM | POA: Diagnosis not present

## 2020-07-25 DIAGNOSIS — M159 Polyosteoarthritis, unspecified: Secondary | ICD-10-CM

## 2020-07-25 MED ORDER — HYDROCHLOROTHIAZIDE 12.5 MG PO TABS: 12.5000 mg | ORAL_TABLET | Freq: Every day | ORAL | 3 refills | Status: DC

## 2020-07-25 MED ORDER — CLONAZEPAM 1 MG PO TABS: 1.0000 mg | ORAL_TABLET | Freq: Every day | ORAL | 1 refills | Status: DC

## 2020-07-25 MED ORDER — LOSARTAN POTASSIUM 50 MG PO TABS: 50.0000 mg | ORAL_TABLET | Freq: Every day | ORAL | 3 refills | Status: DC

## 2020-07-25 NOTE — Patient Instructions (Addendum)
#  Insomnia - glad you were able to reduce the dose of the medication  #Reflux - try to stop the pantoprazole - if unable to stop then we may want to consider having you see GI again  #Knee pain  - use a stationary bike for knee/back - voltaren gel - tumeric   #Back pain - referral to new surgeon for evaluation  #Referral I have placed a referral to a specialist for you. You should receive a phone call from the specialty office. Make sure your voicemail is not full and that if you are able to answer your phone to unknown or new numbers.   It may take up to 2 weeks to hear about the referral. If you do not hear anything in 2 weeks, please call our office and ask to speak with the referral coordinator.

## 2020-07-25 NOTE — Assessment & Plan Note (Signed)
Suspect osteoarthritis. Has failed steroid injections. Encouraged stationary bike training. Referral to ortho.

## 2020-07-25 NOTE — Assessment & Plan Note (Signed)
Encouraged a trial of stopping pantoprazole. If unable to discontinued may be reasonable to GI re-evaluation.

## 2020-07-25 NOTE — Assessment & Plan Note (Signed)
Failed nerve and PT. Rare tramadol. Discussed getting second opinion as with persistent pain and has not see a specialist in 1 year. Referral placed.

## 2020-07-25 NOTE — Assessment & Plan Note (Signed)
Cont trazodone 200 mg. Cont to decrease clonazepam. Currently taking 1 mg nightly. Refill provided.

## 2020-07-25 NOTE — Progress Notes (Signed)
Subjective:     Casey [name]. is a 65 y.o. male presenting for Medication Refill     HPI  #Chronic back pain - was workers comp but not longer covered under this - has received his settlement - tries to not take anything - has failed several treatments including injections  #Insomnia - has reduced his dose - sleep unchanged with lower dose of clonazepam 1 mg and tolerating - would like to continue lower dose  #Knee pain - use to run daily - getting worse - failed injections   Review of Systems   Social History   Tobacco Use  Smoking Status Never Smoker  Smokeless Tobacco Never Used        Objective:    BP Readings from Last 3 Encounters:  07/25/20 130/80  02/27/20 120/82  01/11/20 118/72   Wt Readings from Last 3 Encounters:  07/25/20 209 lb 8 oz (95 kg)  01/11/20 207 lb 4 oz (94 kg)  10/16/19 217 lb 4 oz (98.5 kg)    BP 130/80   Pulse 76   Temp 98.2 F (36.8 C) (Temporal)   Wt 209 lb 8 oz (95 kg)   SpO2 99%   BMI 34.86 kg/m    Physical Exam Constitutional:      Appearance: Normal appearance. He is not ill-appearing or diaphoretic.  HENT:     Right Ear: External ear normal.     Left Ear: External ear normal.  Eyes:     General: No scleral icterus.    Extraocular Movements: Extraocular movements intact.     Conjunctiva/sclera: Conjunctivae normal.  Cardiovascular:     Rate and Rhythm: Normal rate.  Pulmonary:     Effort: Pulmonary effort is normal.  Musculoskeletal:     Cervical back: Neck supple.  Skin:    General: Skin is warm and dry.  Neurological:     Mental Status: He is alert. Mental status is at baseline.  Psychiatric:        Mood and Affect: Mood normal.           Assessment & Plan:   Problem List Items Addressed This Visit      Cardiovascular and Mediastinum   HTN (hypertension) - Primary    BP controlled. Cont losartan 50 mg, hctz 12.5 mg      Relevant Medications   hydrochlorothiazide (HYDRODIURIL)  12.5 MG tablet   losartan (COZAAR) 50 MG tablet     Digestive   GERD (gastroesophageal reflux disease)    Encouraged a trial of stopping pantoprazole. If unable to discontinued may be reasonable to GI re-evaluation.         Musculoskeletal and Integument   Osteoarthritis, multiple sites   Relevant Orders   Ambulatory referral to Orthopedic Surgery     Other   Insomnia    Cont trazodone 200 mg. Cont to decrease clonazepam. Currently taking 1 mg nightly. Refill provided.       Relevant Medications   clonazePAM (KLONOPIN) 1 MG tablet   Chronic back pain    Failed nerve and PT. Rare tramadol. Discussed getting second opinion as with persistent pain and has not see a specialist in 1 year. Referral placed.       Relevant Medications   clonazePAM (KLONOPIN) 1 MG tablet   Other Relevant Orders   Ambulatory referral to Orthopedic Surgery   Bilateral chronic knee pain    Suspect osteoarthritis. Has failed steroid injections. Encouraged stationary bike training. Referral to ortho.  Relevant Medications   clonazePAM (KLONOPIN) 1 MG tablet   Other Relevant Orders   Ambulatory referral to Orthopedic Surgery    Other Visit Diagnoses    Essential hypertension       Relevant Medications   hydrochlorothiazide (HYDRODIURIL) 12.5 MG tablet   losartan (COZAAR) 50 MG tablet       Return in about 5 months (around 12/25/2020) for Medication refills - Annual, schedule with health nurse .  Lesleigh Noe, MD  This visit occurred during the SARS-CoV-2 public health emergency.  Safety protocols were in place, including screening questions prior to the visit, additional usage of staff PPE, and extensive cleaning of exam room while observing appropriate contact time as indicated for disinfecting solutions.

## 2020-07-25 NOTE — Assessment & Plan Note (Signed)
BP controlled. Cont losartan 50 mg, hctz 12.5 mg

## 2020-10-04 ENCOUNTER — Ambulatory Visit: Payer: Medicare HMO | Admitting: Orthopaedic Surgery

## 2020-10-04 ENCOUNTER — Encounter: Payer: Self-pay | Admitting: Orthopaedic Surgery

## 2020-10-04 ENCOUNTER — Other Ambulatory Visit: Payer: Self-pay

## 2020-10-04 DIAGNOSIS — G8929 Other chronic pain: Secondary | ICD-10-CM | POA: Insufficient documentation

## 2020-10-04 DIAGNOSIS — M545 Low back pain, unspecified: Secondary | ICD-10-CM | POA: Diagnosis not present

## 2020-10-04 NOTE — Progress Notes (Signed)
Office Visit Note   Patient: Casey ROUTH Sr.           Date of Birth: 1955-06-30           MRN: [id number] Visit Date: 10/04/2020              Requested by: Lesleigh Noe, MD [place],  [place] [id number] PCP: Lesleigh Noe, MD   Assessment & Plan: Visit Diagnoses:  1. Chronic right-sided low back pain without sciatica     Plan: We reviewed patient's previous MRI scan lumbar which showed some mild changes.  All the treatments that he has had in the past nothing seem to be effective.  I recommend he talk with his PCP about probably starting some mild antidepressant medication at night that also might help him rest.  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  He has been on the Klonopin, does refill, tramadol.  I recommend he get into a daily swimming program.  Symptoms on his back on the right side been persistent since 2018 without change despite all modalities of treatment.  He can return if he has increasing symptoms.  Follow-Up Instructions: No follow-ups on file.   Orders:  No orders of the defined types were placed in this encounter.  No orders of the defined types were placed in this encounter.     Procedures: No procedures performed   Clinical Data: No additional findings.   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  He states he went through treatment including epidurals therapy medications, MRI scans and basically nothing helped.  His case has been settled.  He also describes injuries to his wrist where he tore ligaments.  Injury separate for his shoulder which required surgery which is doing better.  Previous injury to his ankle.  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  He continues to have some back  pain primarily on the right side.  Denies associated bowel or bladder symptoms.  Patient was started working at age 61.  He was taken off the fire department due to injury problems and was placed on disability.  Review of Systems all other systems noncontributory to HPI.   Objective: Vital Signs: BP 136/80   Pulse 73   Ht 5' 5.5" (1.664 m)   Wt 210 lb (95.3 kg)   BMI 34.41 kg/m   Physical Exam Constitutional:      Appearance: He is well-developed.  HENT:     Head: Normocephalic and atraumatic.     Right Ear: External ear normal.     Left Ear: External ear normal.  Eyes:     Pupils: Pupils are equal, round, and reactive to light.  Neck:     Thyroid: No thyromegaly.     Trachea: No tracheal deviation.  Cardiovascular:     Rate and Rhythm: Normal rate.  Pulmonary:     Effort: Pulmonary effort is normal.     Breath sounds: No wheezing.  Abdominal:     General: Bowel sounds are normal.     Palpations: Abdomen is soft.  Musculoskeletal:     Cervical back: Neck supple.  Skin:    General: Skin is warm and dry.  Capillary Refill: Capillary refill takes less than 2 seconds.  Neurological:     Mental Status: He is alert and oriented to person, place, and time.  Psychiatric:        Behavior: Behavior normal.        Thought Content: Thought content normal.        Judgment: Judgment normal.    Ortho Exam patient gets from sitting standing comfortably is able to heel and toe walk negative straight leg raising 90 degrees.  Some tenderness right paralumbar.  Negative FABER test.  Knee and ankle jerk are intact anterior tib gastrocsoleus is intact no atrophy.  No pitting edema pulses are normal.  Specialty Comments:  No specialty comments available.  Imaging: No results found.   PMFS History: Patient Active Problem List   Diagnosis Date Noted   Low back pain 10/04/2020   Bilateral chronic knee pain 07/25/2020   Leukocytosis 08/22/2019   Shortness of breath 08/22/2019    Chronic back pain 08/15/2019   Benzodiazepine dependence ([place]) 08/15/2019   HTN (hypertension) 03/24/2016   GERD (gastroesophageal reflux disease)    Insomnia with sleep apnea 12/06/2013   Insomnia secondary to restless leg syndrome 07/17/2013   Osteoarthritis, multiple sites 04/26/2011   Insomnia 04/26/2011   Past Medical History:  Diagnosis Date   Allergy    seasonal   Arthritis    mild   Cancer (HCC)    skin   Cardiac arrest ([place])    Circadian rhythm sleep disorder, shift work type [id number]   Complication of anesthesia    nausea and vomiting   GERD (gastroesophageal reflux disease)    Hypertension    Insomnia secondary to restless leg syndrome 07/17/2013   Kidney stones    OSA on CPAP    PONV (postoperative nausea and vomiting)    Sleep apnea    cpap    Family History  Problem Relation Age of Onset   Heart disease Mother        CABG   Bladder Cancer Mother        bladder cancer   Hypertension Father    Lung cancer Father        lung cancer after asbestosis   Diabetes Brother    Alcohol abuse Brother    Depression Brother    Hypertension Brother    Kidney disease Brother    Heart disease Maternal Grandmother    Heart attack Maternal Grandfather    Stroke Maternal Grandfather    Bone cancer Paternal Grandfather    Colon cancer Neg Hx     Past Surgical History:  Procedure Laterality Date   acl repair left knee     APPENDECTOMY     BACK SURGERY  2017   nerve blocks   CARDIAC CATHETERIZATION  2005   negative    CHOLECYSTECTOMY     CYSTOSCOPY/RETROGRADE/URETEROSCOPY/STONE EXTRACTION WITH BASKET  04/07/2012   Procedure: CYSTOSCOPY/RETROGRADE/URETEROSCOPY/STONE EXTRACTION WITH BASKET;  Surgeon: Malka So, MD;  Location: [facility];  Service: Urology;  Laterality: Bilateral;  cystoscopy, bilateral stent removal, bilateral ureteroscopy with bilateral stone extraction and insertion of right ureteral stent   fusion great toe right foot     ligaments repair left wrist       ROTATOR CUFF REPAIR     left shoulder    skin cancer removed from left cheek     stone extractions and urethral stents     multiple   tendons cut on left heel     TONSILLECTOMY  VARICOCELE EXCISION     Social History   Occupational History   Occupation: Dealer 1    Employer: [employer]    Comment: Youth worker  Tobacco Use   Smoking status: Never   Smokeless tobacco: Never  Substance and Sexual Activity   Alcohol use: No    Alcohol/week: 0.0 standard drinks   Drug use: No   Sexual activity: Yes    Birth control/protection: Surgical

## 2020-10-18 ENCOUNTER — Other Ambulatory Visit: Payer: Self-pay | Admitting: Family Medicine

## 2020-10-28 ENCOUNTER — Telehealth: Payer: Self-pay

## 2020-10-28 MED ORDER — TRAZODONE HCL 100 MG PO TABS: 200.0000 mg | ORAL_TABLET | Freq: Every day | ORAL | 3 refills | Status: DC

## 2020-10-28 NOTE — Telephone Encounter (Signed)
Left message on voice mail requesting refill on Trazodone. Told by pharmacy to call office for refills.

## 2020-10-28 NOTE — Telephone Encounter (Signed)
Refill sent to pharmacy.   

## 2020-11-11 DIAGNOSIS — R69 Illness, unspecified: Secondary | ICD-10-CM | POA: Diagnosis not present

## 2020-11-11 DIAGNOSIS — I10 Essential (primary) hypertension: Secondary | ICD-10-CM | POA: Diagnosis not present

## 2020-11-11 DIAGNOSIS — E669 Obesity, unspecified: Secondary | ICD-10-CM | POA: Diagnosis not present

## 2020-11-11 DIAGNOSIS — Z833 Family history of diabetes mellitus: Secondary | ICD-10-CM | POA: Diagnosis not present

## 2020-11-11 DIAGNOSIS — Z791 Long term (current) use of non-steroidal anti-inflammatories (NSAID): Secondary | ICD-10-CM | POA: Diagnosis not present

## 2020-11-11 DIAGNOSIS — Z809 Family history of malignant neoplasm, unspecified: Secondary | ICD-10-CM | POA: Diagnosis not present

## 2020-11-11 DIAGNOSIS — K219 Gastro-esophageal reflux disease without esophagitis: Secondary | ICD-10-CM | POA: Diagnosis not present

## 2020-11-11 DIAGNOSIS — Z85828 Personal history of other malignant neoplasm of skin: Secondary | ICD-10-CM | POA: Diagnosis not present

## 2020-11-11 DIAGNOSIS — G4700 Insomnia, unspecified: Secondary | ICD-10-CM | POA: Diagnosis not present

## 2020-11-11 DIAGNOSIS — Z6834 Body mass index (BMI) 34.0-34.9, adult: Secondary | ICD-10-CM | POA: Diagnosis not present

## 2020-11-11 DIAGNOSIS — Z8249 Family history of ischemic heart disease and other diseases of the circulatory system: Secondary | ICD-10-CM | POA: Diagnosis not present

## 2020-12-09 ENCOUNTER — Telehealth: Payer: Self-pay | Admitting: Family Medicine

## 2020-12-09 NOTE — Telephone Encounter (Signed)
Called to to r/s appt with [facility] on 12/18/20. I was not able to LVM because [facility] was not set up yet.

## 2020-12-18 ENCOUNTER — Ambulatory Visit: Payer: Medicare HMO

## 2020-12-25 ENCOUNTER — Encounter: Payer: Medicare HMO | Admitting: Family Medicine

## 2020-12-26 ENCOUNTER — Other Ambulatory Visit: Payer: Self-pay

## 2020-12-26 ENCOUNTER — Ambulatory Visit (INDEPENDENT_AMBULATORY_CARE_PROVIDER_SITE_OTHER): Payer: Medicare HMO | Admitting: Family Medicine

## 2020-12-26 ENCOUNTER — Encounter: Payer: Self-pay | Admitting: Family Medicine

## 2020-12-26 VITALS — BP 126/82 | HR 67 | Temp 96.2°F | Ht 64.5 in | Wt 211.2 lb

## 2020-12-26 DIAGNOSIS — Z0000 Encounter for general adult medical examination without abnormal findings: Secondary | ICD-10-CM | POA: Diagnosis not present

## 2020-12-26 DIAGNOSIS — Z23 Encounter for immunization: Secondary | ICD-10-CM

## 2020-12-26 DIAGNOSIS — R7303 Prediabetes: Secondary | ICD-10-CM | POA: Diagnosis not present

## 2020-12-26 DIAGNOSIS — F5101 Primary insomnia: Secondary | ICD-10-CM

## 2020-12-26 DIAGNOSIS — R69 Illness, unspecified: Secondary | ICD-10-CM | POA: Diagnosis not present

## 2020-12-26 DIAGNOSIS — I10 Essential (primary) hypertension: Secondary | ICD-10-CM

## 2020-12-26 DIAGNOSIS — K2100 Gastro-esophageal reflux disease with esophagitis, without bleeding: Secondary | ICD-10-CM | POA: Diagnosis not present

## 2020-12-26 DIAGNOSIS — G8929 Other chronic pain: Secondary | ICD-10-CM

## 2020-12-26 DIAGNOSIS — M545 Low back pain, unspecified: Secondary | ICD-10-CM | POA: Diagnosis not present

## 2020-12-26 DIAGNOSIS — Z13220 Encounter for screening for lipoid disorders: Secondary | ICD-10-CM | POA: Diagnosis not present

## 2020-12-26 MED ORDER — TRAMADOL HCL 50 MG PO TABS: ORAL_TABLET | ORAL | 0 refills | Status: DC

## 2020-12-26 MED ORDER — CLONAZEPAM 1 MG PO TABS: 1.0000 mg | ORAL_TABLET | Freq: Every day | ORAL | 1 refills | Status: DC

## 2020-12-26 MED ORDER — PANTOPRAZOLE SODIUM 40 MG PO TBEC: 40.0000 mg | DELAYED_RELEASE_TABLET | Freq: Every day | ORAL | 1 refills | Status: DC

## 2020-12-26 NOTE — Patient Instructions (Signed)
Consider metformin for diabetes prevention  Metformin; Repaglinide tablets What is this medication? METFORMIN; REPAGLINIDE (met FOR min; re PAG lin ide) is used to treat type 2 diabetes. Treatment is combined with diet and exercise. This medicine helps your body to use insulin better. This medicine may be used for other purposes; ask your health care provider or pharmacist if you have questions. COMMON BRAND NAME(S): PrandiMet What should I tell my care team before I take this medication? They need to know if you have any of these conditions: anemia dehydration diabetic ketoacidosis heart disease if you often drink alcohol kidney disease liver disease polycystic ovary syndrome serious infection or injury vomiting an unusual or allergic reaction to metformin, repaglinide, other medicines, foods, dyes, or preservatives pregnant or trying to get pregnant breast-feeding How should I use this medication? Take this medicine by mouth. Swallow it with a drink of water. Follow the directions on the prescription label. Take this medicine before meals. It should be taken no earlier than 30 minutes before meals. If a meal is skipped, skip the dose for that meal. Do not take more often than directed. Talk to your pediatrician regarding the use of this medicine in children. Special care may be needed. Elderly patients over 39 years old may have a stronger reaction and need a smaller dose. Overdosage: If you think you have taken too much of this medicine contact a poison control center or emergency room at once. NOTE: This medicine is only for you. Do not share this medicine with others. What if I miss a dose? If you miss a dose before a meal, skip that dose. If it is almost time for your next dose, take only that dose with the next scheduled meal as directed. Do not take double or extra doses. What may interact with this medication? Do not take this medicine with any of the following  medications: certain contrast medicines given before X-rays, CT scans, MRI, or other procedures dofetilide gemfibrozil This medicine may also interact with the following medications: acetazolamide alcohol barbiturates like phenobarbital or primidone carbamazepine certain antivirals for HIV or hepatitis certain medicines for blood pressure, heart disease, irregular heart beat certain medicines for fungal infections like ketoconazole and itraconazole cimetidine clopidogrel clarithromycin cyclosporine deferasirox dichlorphenamide digoxin diuretics erythromycin male hormones, like estrogens or progestins and birth control pills glycopyrrolate isoniazid lamotrigine memantine methazolamide metoclopramide midodrine niacin phenothiazines like chlorpromazine, mesoridazine, prochlorperazine, thioridazine phenytoin ranolazine rifampin simvastatin steroid medicines like prednisone or cortisone stimulant medicines for attention disorders, weight loss, or to stay awake thyroid medicines topiramate trospium vandetanib zonisamide This list may not describe all possible interactions. Give your health care provider a list of all the medicines, herbs, non-prescription drugs, or dietary supplements you use. Also tell them if you smoke, drink alcohol, or use illegal drugs. Some items may interact with your medicine. What should I watch for while using this medication? Visit your doctor or health care professional for regular checks on your progress. A test called the HbA1C (A1C) will be monitored. This is a simple blood test. It measures your blood sugar control over the last 2 to 3 months. You will receive this test every 3 to 6 months. Learn how to check your blood sugar. Learn the symptoms of low and high blood sugar and how to manage them. Always carry a quick-source of sugar with you in case you have symptoms of low blood sugar. Examples include hard sugar candy or glucose tablets.  Make sure others know that you  can choke if you eat or drink when you develop serious symptoms of low blood sugar, such as seizures or unconsciousness. They must get medical help at once. Tell your doctor or health care professional if you have high blood sugar. You might need to change the dose of your medicine. If you are sick or exercising more than usual, you might need to change the dose of your medicine. Do not skip meals. Ask your doctor or health care professional if you should avoid alcohol. Many nonprescription cough and cold products contain sugar or alcohol. These can affect blood sugar. This medicine may cause ovulation in premenopausal women who do not have regular monthly periods. This may increase your chances of becoming pregnant. You should not take this medicine if you become pregnant or think you may be pregnant. Talk with your doctor or health care professional about your birth control options while taking this medicine. Contact your doctor or health care professional right away if you think you are pregnant. If you are going to need surgery, a MRI, CT scan, or other procedure, tell your doctor that you are taking this medicine. You may need to stop taking this medicine before the procedure. Wear a medical ID bracelet or chain, and carry a card that describes your disease and details of your medicine and dosage times. This medicine may cause a decrease in folic acid and vitamin B12. You should make sure that you get enough vitamins while you are taking this medicine. Discuss the foods you eat and the vitamins you take with your health care professional. What side effects may I notice from receiving this medication? Side effects that you should report to your doctor or health care professional as soon as possible: allergic reactions like skin rash, itching or hives, swelling of the face, lips, or tongue breathing problems dark urine feeling faint or lightheaded, falls fever, chills,  sore throat muscle aches or pains redness, blistering, peeling, or loosening of the skin, including inside the mouth signs and symptoms of low blood sugar such as feeling anxious, confusion, dizziness, increased hunger, unusually weak or tired, sweating, shakiness, cold, irritable, headache, blurred vision, fast heartbeat, loss of consciousness slow or irregular heartbeat unusual bleeding or bruising unusual stomach upset or pain vomiting yellowing of the eyes or skin Side effects that usually do not require medical attention (report to your doctor or health care professional if they continue or are bothersome): diarrhea dizziness headache gas metallic taste in mouth upset stomach This list may not describe all possible side effects. Call your doctor for medical advice about side effects. You may report side effects to FDA at 1-800-FDA-1088. Where should I keep my medication? Keep out of the reach of children. Store at temperatures less than 25 degrees C (77 degrees F). Protect from light. Throw away any unused medicine after the expiration date. NOTE: This sheet is a summary. It may not cover all possible information. If you have questions about this medicine, talk to your doctor, pharmacist, or health care provider.  2022 Elsevier/Gold Standard (2017-03-30 18:05:00)

## 2020-12-26 NOTE — Progress Notes (Signed)
Subjective:   Casey [name]. is a 65 y.o. male who presents for an Initial Medicare Annual Wellness Visit.  Review of Systems    Review of Systems  Constitutional:  Negative for chills and fever.  HENT:  Negative for congestion and sore throat.   Eyes:  Negative for blurred vision and double vision.  Respiratory:  Negative for shortness of breath.   Cardiovascular:  Negative for chest pain.  Gastrointestinal:  Negative for heartburn, nausea and vomiting.  Genitourinary: Negative.   Musculoskeletal:  Positive for back pain and joint pain. Negative for myalgias.  Skin:  Negative for rash.  Neurological:  Negative for dizziness and headaches.  Endo/Heme/Allergies:  Does not bruise/bleed easily.  Psychiatric/Behavioral:  Negative for depression. The patient is not nervous/anxious.     Cardiac Risk Factors include: advanced age (>81men, >51 women);dyslipidemia;male gender;hypertension;obesity (BMI >30kg/m2)     Objective:    Today's Vitals   12/26/20 1448  BP: 126/82  Pulse: 67  Temp: (!) 96.2 F (35.7 C)  TempSrc: Temporal  SpO2: 97%  Weight: 211 lb 4 oz (95.8 kg)  Height: 5' 4.5" (1.638 m)   Body mass index is 35.7 kg/m.  Advanced Directives 12/26/2020 02/28/2018 05/14/2014 12/06/2013 04/07/2012  Does Patient Have a Medical Advance Directive? No No No Yes Patient does not have advance directive  Type of Advance Directive - - - [place];Living will -  Does patient want to make changes to medical advance directive? - No - Patient declined - No - Patient declined -  Copy of [place] in Chart? - - - No - copy requested -  Would patient like information on creating a medical advance directive? Yes (MAU/Ambulatory/Procedural Areas - Information given) No - Patient declined No - patient declined information - -  Pre-existing out of facility DNR order (yellow form or pink MOST form) - - - - No    Current Medications  (verified) Outpatient Encounter Medications as of 12/26/2020  Medication Sig   acetaminophen (TYLENOL) 500 MG tablet Take 1,000 mg by mouth in the morning, at noon, and at bedtime.   hydrochlorothiazide (HYDRODIURIL) 12.5 MG tablet Take 1 tablet (12.5 mg total) by mouth daily.   losartan (COZAAR) 50 MG tablet Take 1 tablet (50 mg total) by mouth daily.   Probiotic Product (PROBIOTIC DAILY PO) Take 1 capsule by mouth daily. PB8 Probiotic   traZODone (DESYREL) 100 MG tablet Take 2 tablets (200 mg total) by mouth at bedtime.   TURMERIC PO Take by mouth in the morning, at noon, and at bedtime.   [DISCONTINUED] clonazePAM (KLONOPIN) 1 MG tablet Take 1 tablet (1 mg total) by mouth at bedtime.   [DISCONTINUED] pantoprazole (PROTONIX) 40 MG tablet TAKE 1 TABLET BY MOUTH EVERY DAY   [DISCONTINUED] traMADol (ULTRAM) 50 MG tablet TAKE 1 TABLET (50 MG TOTAL) BY MOUTH 2 (TWO) TIMES DAILY AS NEEDED   clonazePAM (KLONOPIN) 1 MG tablet Take 1 tablet (1 mg total) by mouth at bedtime.   pantoprazole (PROTONIX) 40 MG tablet Take 1 tablet (40 mg total) by mouth daily.   traMADol (ULTRAM) 50 MG tablet TAKE 1 TABLET (50 MG TOTAL) BY MOUTH 2 (TWO) TIMES DAILY AS NEEDED   [DISCONTINUED] albuterol (VENTOLIN HFA) 108 (90 Base) MCG/ACT inhaler INHALE 2 PUFFS INTO THE LUNGS EVERY 4 HOURS AS NEEDED FOR WHEEZE   No facility-administered encounter medications on file as of 12/26/2020.    Allergies (verified) Prednisone, Codeine, and Hydrocodone  History: Past Medical History:  Diagnosis Date   Allergy    seasonal   Arthritis    mild   Cancer ([place])    skin   Cardiac arrest ([place])    Circadian rhythm sleep disorder, shift work type 8/58/8502   Complication of anesthesia    nausea and vomiting   GERD (gastroesophageal reflux disease)    Hypertension    Insomnia secondary to restless leg syndrome 07/17/2013   Kidney stones    OSA on CPAP    PONV (postoperative nausea and vomiting)    Sleep apnea    cpap    Past Surgical History:  Procedure Laterality Date   acl repair left knee     APPENDECTOMY     BACK SURGERY  2017   nerve blocks   CARDIAC CATHETERIZATION  2005   negative    CHOLECYSTECTOMY     CYSTOSCOPY/RETROGRADE/URETEROSCOPY/STONE EXTRACTION WITH BASKET  04/07/2012   Procedure: CYSTOSCOPY/RETROGRADE/URETEROSCOPY/STONE EXTRACTION WITH BASKET;  Surgeon: Malka So, MD;  Location: [facility];  Service: Urology;  Laterality: Bilateral;  cystoscopy, bilateral stent removal, bilateral ureteroscopy with bilateral stone extraction and insertion of right ureteral stent   fusion great toe right foot     ligaments repair left wrist      ROTATOR CUFF REPAIR     left shoulder    skin cancer removed from left cheek     stone extractions and urethral stents     multiple   tendons cut on left heel     TONSILLECTOMY     VARICOCELE EXCISION     Family History  Problem Relation Age of Onset   Heart disease Mother        CABG   Bladder Cancer Mother        bladder cancer   Hypertension Father    Lung cancer Father        lung cancer after asbestosis   Diabetes Brother    Alcohol abuse Brother    Depression Brother    Hypertension Brother    Kidney disease Brother    Heart disease Maternal Grandmother    Heart attack Maternal Grandfather    Stroke Maternal Grandfather    Bone cancer Paternal Grandfather    Colon cancer Neg Hx    Social History   Socioeconomic History   Marital status: Married    Spouse name: Programmer, systems   Number of children: 2   Years of education: college   Highest education level: Not on file  Occupational History   Occupation: Dealer 1    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  Tobacco Use   Smoking status: Never   Smokeless tobacco: Never  Substance and Sexual Activity   Alcohol use: No    Alcohol/week: 0.0 standard drinks   Drug use: No   Sexual activity: Yes    Birth control/protection: Surgical  Other Topics Concern    Not on file  Social History Narrative   08/15/19   From: the area   Living: with wife, Casey [name] (1975)   Work: Retired from [employer], works as a prn Environmental education officer      Family: 2 children - Casey [name] and Casey [name] - 3 grandsons      Enjoys: golf, fish (when back doesn't bother him)      Exercise: stretches when back is good   Diet: low carb to try to lose weight      Safety   Seat belts: Yes  Guns: Yes  and not secure   Safe in relationships: Yes    Social Determinants of Radio broadcast assistant Strain: Not on file  Food Insecurity: Not on file  Transportation Needs: Not on file  Physical Activity: Not on file  Stress: Not on file  Social Connections: Not on file    Tobacco Counseling Counseling given: Not Answered   Clinical Intake:  Pre-visit preparation completed: No  Pain : 0-10 Pain Type: Chronic pain Pain Location: Back Pain Orientation: Left Pain Descriptors / Indicators: Aching Pain Onset: More than a month ago Pain Frequency: Intermittent Pain Relieving Factors: tramadol, exercises  Pain Relieving Factors: tramadol, exercises  Nutritional Risks: None Diabetes: No  How often do you need to have someone help you when you read instructions, pamphlets, or other written materials from your doctor or pharmacy?: 1 - Never What is the last grade level you completed in school?: college  Diabetic? no  Interpreter Needed?: No      Activities of Daily Living In your present state of health, do you have any difficulty performing the following activities: 12/26/2020  Hearing? N  Vision? N  Difficulty concentrating or making decisions? N  Walking or climbing stairs? Y  Comment when knee bothersome  Dressing or bathing? N  Doing errands, shopping? N  Preparing Food and eating ? N  Using the Toilet? N  In the past six months, have you accidently leaked urine? N  Do you have problems with loss of bowel control? N  Managing your Medications? N   Managing your Finances? N  Housekeeping or managing your Housekeeping? N  Some recent data might be hidden    Patient Care Team: Lesleigh Noe, MD as PCP - General (Family Medicine) Pyrtle, Lajuan Lines, MD as Consulting Physician (Gastroenterology) Bryson Ha, OD (Optometry) Irine Seal, MD as Attending Physician (Urology)  Indicate any recent Medical Services you may have received from other than [facility] providers in the past year (date may be approximate).     Assessment:   This is a routine wellness examination for Casey [name].  Hearing/Vision screen Hearing Screening   250Hz  500Hz  1000Hz  2000Hz  4000Hz   Right ear 20 20 20 20 20   Left ear 20 20 20 20 20    Vision Screening   Right eye Left eye Both eyes  Without correction 20/30 20/25 20/25   With correction       Dietary issues and exercise activities discussed: Current Exercise Habits: Home exercise routine, Type of exercise: walking, Time (Minutes): 30, Frequency (Times/Week): 3, Weekly Exercise (Minutes/Week): 90, Intensity: Moderate, Exercise limited by: orthopedic condition(s)   Goals Addressed             This Visit's Progress    Weight (lb) < 180 lb (81.6 kg)   211 lb 4 oz (95.8 kg)     Depression Screen PHQ 2/9 Scores 12/26/2020 01/11/2020 12/09/2018 09/06/2018 06/16/2018 04/29/2018 03/11/2018  PHQ - 2 Score 0 0 0 0 0 0 0    Fall Risk Fall Risk  12/26/2020 01/11/2020 12/09/2018 09/06/2018 06/16/2018  Falls in the past year? 0 1 0 1 1  Comment - - - - -  Number falls in past yr: 0 0 0 0 0  Comment - - - - scratches and bruises to face/body  Injury with Fall? 0 0 0 1 0  Comment - - - concussion -  Risk for fall due to : Orthopedic patient - - - -  Follow up Falls evaluation completed - -  Falls evaluation completed -    FALL RISK PREVENTION PERTAINING TO THE HOME:  Any stairs in or around the home? Yes  If so, are there any without handrails? Yes  Home free of loose throw rugs in walkways, pet beds, electrical  cords, etc? Yes  Adequate lighting in your home to reduce risk of falls? Yes   ASSISTIVE DEVICES UTILIZED TO PREVENT FALLS:  Life alert? No  Use of a cane, walker or w/c? No  Grab bars in the bathroom? Yes  Shower chair or bench in shower? No  Elevated toilet seat or a handicapped toilet? Yes   Cognitive Function:        Mini-Cog - 12/26/20 1448     Normal clock drawing test? yes    How many words correct? 3              Immunizations Immunization History  Administered Date(s) Administered   Fluad Quad(high Dose 65+) 12/26/2020   Influenza Inj Mdck Quad Pf 12/23/2016   Influenza Split 03/02/2014, 11/30/2016   Influenza Whole 12/21/2011   Influenza,inj,Quad PF,6+ Mos 12/08/2012, 11/06/2014, 01/18/2016, 12/30/2017, 10/27/2018, 12/14/2019   PFIZER(Purple Top)SARS-COV-2 Vaccination 05/25/2019, 06/20/2019, 12/28/2019, 08/01/2020   PNEUMOCOCCAL CONJUGATE-20 08/01/2020   Pneumococcal Conjugate-13 01/11/2020   Pneumococcal Polysaccharide-23 05/09/2015   Tdap 04/30/2009, 03/19/2013   Zoster Recombinat (Shingrix) 10/27/2018, 07/22/2019   Zoster, Live 01/01/2016    TDAP status: Up to date  Flu Vaccine status: Up to date  Pneumococcal vaccine status: Up to date  Covid-19 vaccine status: Completed vaccines  Qualifies for Shingles Vaccine? Yes   Zostavax completed Yes   Shingrix Completed?: Yes  Screening Tests Health Maintenance  Topic Date Due   COVID-19 Vaccine (5 - Booster for Pfizer series) 09/26/2020   TETANUS/TDAP  03/20/2023   COLONOSCOPY (Pts 45-59yrs Insurance coverage will need to be confirmed)  05/13/2024   Pneumonia Vaccine 63+ Years old  Completed   INFLUENZA VACCINE  Completed   Hepatitis C Screening  Completed   Zoster Vaccines- Shingrix  Completed   HPV VACCINES  Aged Out    Health Maintenance  Health Maintenance Due  Topic Date Due   COVID-19 Vaccine (5 - Booster for [place] series) 09/26/2020    Colorectal cancer screening: Type of  screening: Colonoscopy. Completed 2016. Repeat every 10 years  Lung Cancer Screening: (Low Dose CT Chest recommended if Age 92-80 years, 30 pack-year currently smoking OR have quit w/in 15years.) does not qualify.   Lung Cancer Screening Referral: n/a  Additional Screening:  No results found for: HAV, HEPAIGM, HEPBIGM, HEPBCAB, HBEAG, HEPCAB   Hepatitis C Screening: does qualify; Completed 2017  Vision Screening: Recommended annual ophthalmology exams for early detection of glaucoma and other disorders of the eye. Is the patient up to date with their annual eye exam?  Yes  Who is the provider or what is the name of the office in which the patient attends annual eye exams? Eye center at [facility] crossings If pt is not established with a provider, would they like to be referred to a provider to establish care? No .   Dental Screening: Recommended annual dental exams for proper oral hygiene  Community Resource Referral / Chronic Care Management: CRR required this visit?  No   CCM required this visit?  No      Plan:     Problem List Items Addressed This Visit       Cardiovascular and Mediastinum   HTN (hypertension)   Relevant Orders   Comprehensive metabolic  panel   CBC     Digestive   GERD (gastroesophageal reflux disease)   Relevant Medications   pantoprazole (PROTONIX) 40 MG tablet     Other   Insomnia   Relevant Medications   clonazePAM (KLONOPIN) 1 MG tablet   Low back pain   Relevant Medications   traMADol (ULTRAM) 50 MG tablet   Other Visit Diagnoses     Encounter for Medicare annual wellness exam    -  Primary   Need for influenza vaccination       Relevant Orders   Flu Vaccine QUAD High Dose(Fluad) (Completed)   Prediabetes       Relevant Orders   Hemoglobin A1c   Screening for hyperlipidemia       Relevant Orders   Lipid panel        I have personally reviewed and noted the following in the patient's chart:   Medical and social  history Use of alcohol, tobacco or illicit drugs  Current medications and supplements including opioid prescriptions. Patient is currently taking opioid prescriptions. Information provided to patient regarding non-opioid alternatives. Patient advised to discuss non-opioid treatment plan with their provider. Functional ability and status Nutritional status Physical activity Advanced directives List of other physicians Hospitalizations, surgeries, and ER visits in previous 12 months Vitals Screenings to include cognitive, depression, and falls Referrals and appointments  In addition, I have reviewed and discussed with patient certain preventive protocols, quality metrics, and best practice recommendations. A written personalized care plan for preventive services as well as general preventive health recommendations were provided to patient.     Lesleigh Noe, MD   12/26/2020

## 2020-12-27 LAB — COMPREHENSIVE METABOLIC PANEL
ALT: 19 U/L (ref 0–53)
AST: 18 U/L (ref 0–37)
Albumin: 4.4 g/dL (ref 3.5–5.2)
Alkaline Phosphatase: 59 U/L (ref 39–117)
BUN: 24 mg/dL — ABNORMAL HIGH (ref 6–23)
CO2: 30 mEq/L (ref 19–32)
Calcium: 9.4 mg/dL (ref 8.4–10.5)
Chloride: 100 mEq/L (ref 96–112)
Creatinine, Ser: 1.42 mg/dL (ref 0.40–1.50)
GFR: 51.63 mL/min — ABNORMAL LOW (ref >60.00)
Glucose, Bld: 84 mg/dL (ref 70–99)
Potassium: 4.1 mEq/L (ref 3.5–5.1)
Sodium: 139 mEq/L (ref 135–145)
Total Bilirubin: 1.1 mg/dL (ref 0.2–1.2)
Total Protein: 6.9 g/dL (ref 6.0–8.3)

## 2020-12-27 LAB — LIPID PANEL
Cholesterol: 180 mg/dL (ref 0–200)
HDL: 35.7 mg/dL — ABNORMAL LOW (ref >39.00)
LDL Cholesterol: 125 mg/dL — ABNORMAL HIGH (ref 0–99)
NonHDL: 144.58
Total CHOL/HDL Ratio: 5
Triglycerides: 100 mg/dL (ref 0.0–149.0)
VLDL: 20 mg/dL (ref 0.0–40.0)

## 2020-12-27 LAB — CBC
HCT: 45.5 % (ref 39.0–52.0)
Hemoglobin: 15.6 g/dL (ref 13.0–17.0)
MCHC: 34.3 g/dL (ref 30.0–36.0)
MCV: 90.1 fl (ref 78.0–100.0)
Platelets: 225 10*3/uL (ref 150.0–400.0)
RBC: 5.05 Mil/uL (ref 4.22–5.81)
RDW: 13.3 % (ref 11.5–15.5)
WBC: 6.4 10*3/uL (ref 4.0–10.5)

## 2020-12-27 LAB — HEMOGLOBIN A1C: Hgb A1c MFr Bld: 5.4 % (ref 4.6–6.5)

## 2020-12-30 DIAGNOSIS — N400 Enlarged prostate without lower urinary tract symptoms: Secondary | ICD-10-CM | POA: Diagnosis not present

## 2020-12-30 DIAGNOSIS — R972 Elevated prostate specific antigen [PSA]: Secondary | ICD-10-CM | POA: Diagnosis not present

## 2020-12-30 DIAGNOSIS — N50811 Right testicular pain: Secondary | ICD-10-CM | POA: Diagnosis not present

## 2020-12-31 ENCOUNTER — Encounter (HOSPITAL_BASED_OUTPATIENT_CLINIC_OR_DEPARTMENT_OTHER): Payer: Self-pay | Admitting: *Deleted

## 2020-12-31 ENCOUNTER — Emergency Department (HOSPITAL_BASED_OUTPATIENT_CLINIC_OR_DEPARTMENT_OTHER)
Admission: EM | Admit: 2020-12-31 | Discharge: 2020-12-31 | Disposition: A | Discharge status: Home / Self Care | Payer: Medicare HMO | Attending: Emergency Medicine | Admitting: Emergency Medicine

## 2020-12-31 ENCOUNTER — Emergency Department (HOSPITAL_BASED_OUTPATIENT_CLINIC_OR_DEPARTMENT_OTHER): Payer: Medicare HMO

## 2020-12-31 ENCOUNTER — Other Ambulatory Visit: Payer: Self-pay

## 2020-12-31 DIAGNOSIS — Z5321 Procedure and treatment not carried out due to patient leaving prior to being seen by health care provider: Secondary | ICD-10-CM | POA: Insufficient documentation

## 2020-12-31 DIAGNOSIS — R059 Cough, unspecified: Secondary | ICD-10-CM | POA: Diagnosis not present

## 2020-12-31 DIAGNOSIS — R509 Fever, unspecified: Secondary | ICD-10-CM | POA: Diagnosis not present

## 2020-12-31 DIAGNOSIS — U071 COVID-19: Secondary | ICD-10-CM | POA: Diagnosis not present

## 2020-12-31 NOTE — ED Triage Notes (Signed)
Home Covid test was positive today. Here with fever and cough.

## 2021-01-01 ENCOUNTER — Telehealth (INDEPENDENT_AMBULATORY_CARE_PROVIDER_SITE_OTHER): Payer: Medicare HMO | Admitting: Family Medicine

## 2021-01-01 ENCOUNTER — Telehealth: Payer: Self-pay

## 2021-01-01 ENCOUNTER — Other Ambulatory Visit: Payer: Self-pay | Admitting: Urology

## 2021-01-01 ENCOUNTER — Encounter: Payer: Self-pay | Admitting: Family Medicine

## 2021-01-01 VITALS — Wt 211.0 lb

## 2021-01-01 DIAGNOSIS — U071 COVID-19: Secondary | ICD-10-CM

## 2021-01-01 DIAGNOSIS — R972 Elevated prostate specific antigen [PSA]: Secondary | ICD-10-CM

## 2021-01-01 MED ORDER — NIRMATRELVIR/RITONAVIR (PAXLOVID) TABLET (RENAL DOSING): 2.0000 | ORAL_TABLET | Freq: Two times a day (BID) | ORAL | 0 refills | Status: AC

## 2021-01-01 MED ORDER — PROMETHAZINE-DM 6.25-15 MG/5ML PO SYRP
5.0000 mL | ORAL_SOLUTION | Freq: Four times a day (QID) | ORAL | 0 refills | Status: DC | PRN
Start: 2021-01-01 — End: 2021-06-10

## 2021-01-01 NOTE — Telephone Encounter (Signed)
Per chart review tab pt went to [facility] ED 12/31/20 but  LWBS.  Per imaging tab pt had CXR on 12/31/20. Per note pt is requesting covid medication. Since pt was not seen would he need video appt with [facility] or go to an UC.Please advise.  Sending note to Dr Einar Pheasant and Alyse Low CMA. Will also teams [place].

## 2021-01-01 NOTE — Telephone Encounter (Signed)
Please see note from today 

## 2021-01-01 NOTE — Progress Notes (Signed)
I connected with Casey VENNING Sr. on 01/01/21 at 12:00 PM EDT by video and verified that I am speaking with the correct person using two identifiers.   I discussed the limitations, risks, security and privacy concerns of performing an evaluation and management service by video and the availability of in person appointments. I also discussed with the patient that there may be a patient responsible charge related to this service. The patient expressed understanding and agreed to proceed.  Patient location: Home Provider Location: [facility] Participants: Lesleigh Noe and [place].   Subjective:     Casey [name]. is a 65 y.o. male presenting for Covid Positive (Sx onset 12/29/20. Tested + yesterday ), Cough, Nasal Congestion, Fever (Low grade), Fatigue, and Headache     Cough Associated symptoms include a fever and headaches.  Fever  Associated symptoms include coughing and headaches.  Headache  Associated symptoms include coughing and a fever.   #Covid positive - symptoms on 12/29/2020 - tested positive positive yesterday - not feeling well - taking tylenol - taking prescription cough syrup which was left over and it is helping  No n/v/diarrhea  No cp or sob   Review of Systems  Constitutional:  Positive for fever.  Respiratory:  Positive for cough.   Neurological:  Positive for headaches.    Social History   Tobacco Use  Smoking Status Never  Smokeless Tobacco Never        Objective:   BP Readings from Last 3 Encounters:  12/31/20 (!) 147/96  12/26/20 126/82  10/04/20 136/80   Wt Readings from Last 3 Encounters:  01/01/21 211 lb (95.7 kg)  12/31/20 211 lb 3.2 oz (95.8 kg)  12/26/20 211 lb 4 oz (95.8 kg)   Wt 211 lb (95.7 kg)   BMI 35.11 kg/m    Physical Exam Constitutional:      Appearance: Normal appearance. He is not ill-appearing.  HENT:     Head: Normocephalic and atraumatic.     Right Ear: External ear  normal.     Left Ear: External ear normal.  Eyes:     Conjunctiva/sclera: Conjunctivae normal.  Pulmonary:     Effort: Pulmonary effort is normal. No respiratory distress.  Neurological:     Mental Status: He is alert. Mental status is at baseline.  Psychiatric:        Mood and Affect: Mood normal.        Behavior: Behavior normal.        Thought Content: Thought content normal.        Judgment: Judgment normal.         Assessment & Plan:   Problem List Items Addressed This Visit   None Visit Diagnoses     COVID-19 virus infection    -  Primary   Relevant Medications   promethazine-dextromethorphan (PROMETHAZINE-DM) 6.25-15 MG/5ML syrup   nirmatrelvir/ritonavir EUA, renal dosing, (PAXLOVID) 10 x 150 MG & 10 x 100MG  TABS       Patient is at increased risk for developing severe covid due to HTN and obesity. They are eligible for anti-viral medication.   Discussed Paxlovid and they would like to start. Normal liver function, though mild CKD.   Will do renal dosing of paxlovid.   Lab Results  Component Value Date   ALT 19 12/26/2020   AST 18 12/26/2020   ALKPHOS 59 12/26/2020   BILITOT 1.1 12/26/2020    Lab Results  Component Value  Date   CREATININE 1.42 12/26/2020     Medication sent to pharmacy  Reviewed ER and return precautions  Reviewed isolation guidelines.   Return if symptoms worsen or fail to improve.  Lesleigh Noe, MD

## 2021-01-01 NOTE — Telephone Encounter (Signed)
[place] Night - Client TELEPHONE ADVICE RECORD [facility] Patient Name: Casey [name] [place] Gender: Male DOB: [birth year] Age: 65 Y 1 M 24 D Return Phone Number: [phone] (Primary) Address: City/ State/ Zip: [place] [address] Client [facility] Night - Client Client Site [place] Physician Waunita Schooner- MD Contact Type Call Who Is Calling Patient / Member / Family / Caregiver Call Type Triage / Clinical Relationship To Patient Self Return Phone Number [phone] (Primary) Chief Complaint Ear Fullness or Congestion Reason for Call Symptomatic / Request for [place] states he tested positive for covid and is requesting for covid medication. Caller states he has congestion, cough with a slight fever and runny nose. Translation No Nurse Assessment Nurse: Laveda Abbe, RN, Marjory Lies Date/Time Eilene Ghazi Time): 12/31/2020 7:30:57 PM Confirm and document reason for call. If symptomatic, describe symptoms. ---Caller states he tested positive for covid and is requesting for covid medication. Caller states he has congestion, cough with a slight fever and runny nose. Pt started feeling bad on Sunday. Does the patient have any new or worsening symptoms? ---Yes Will a triage be completed? ---Yes Related visit to physician within the last 2 weeks? ---No Does the PT have any chronic conditions? (i.e. diabetes, asthma, this includes High risk factors for pregnancy, etc.) ---Yes List chronic conditions. ---Hypertension insomnia GERD Is this a behavioral health or substance abuse call? ---No Guidelines Guideline Title Affirmed Question Affirmed Notes Nurse Date/Time (Eastern Time) COVID-19 - Diagnosed or Suspected [1] HIGH RISK for severe COVID complications (e.g., weak immune system, age > 33 years, obesity with BMI 30 or higher, pregnant, chronic Laveda Abbe, [place]  12/31/2020 7:33:39 PM PLEASE NOTE: All timestamps contained within this report are represented as [place] Standard Time. CONFIDENTIALTY NOTICE: This fax transmission is intended only for the addressee. It contains information that is legally privileged, confidential or otherwise protected from use or disclosure. If you are not the intended recipient, you are strictly prohibited from reviewing, disclosing, copying using or disseminating any of this information or taking any action in reliance on or regarding this information. If you have received this fax in error, please notify us immediately by telephone so that we can arrange for its return to [place]. Phone: [phone], Toll-Free: [phone], Fax: [phone] Page: 2 of 2 Call Id: [id number] Guidelines Guideline Title Affirmed Question Affirmed Notes Nurse Date/Time Eilene Ghazi Time) lung disease or other chronic medical condition) AND [2] COVID symptoms (e.g., cough, fever) (Exceptions: Already seen by PCP and no new or worsening symptoms.) Disp. Time Eilene Ghazi Time) Disposition Final User 12/31/2020 7:09:45 PM Attempt made - message left Hassan Buckler 12/31/2020 7:21:16 PM FINAL ATTEMPT MADE - message left Hassan Buckler 12/31/2020 7:21:23 PM Send to RN Final Attempt Dyann Ruddle, RN, Marjory Lies 12/31/2020 7:41:23 PM Call PCP within 24 Hours Yes Laveda Abbe, RN, Carlyon Shadow Disagree/Comply Comply Caller Understands Yes PreDisposition Call Doctor Care Advice Given Per Guideline CALL PCP WITHIN 24 HOURS: * You need to discuss this with your doctor (or NP/PA) within the next 24 hours. Comments User: Gayla Doss, RN Date/Time Eilene Ghazi Time): 12/31/2020 7:35:47 PM O2 94% HR 100 Referrals REFERRED TO PCP OFFIC

## 2021-01-19 NOTE — Progress Notes (Addendum)
Subjective:   Casey [name]. is a 65 y.o. male who presents for Medicare Annual/Subsequent preventive examination.  I connected with Dominique Calvey today by telephone and verified that I am speaking with the correct person using two identifiers. Location patient: home Location provider: work Persons participating in the virtual visit: patient, Marine scientist.    I discussed the limitations, risks, security and privacy concerns of performing an evaluation and management service by telephone and the availability of in person appointments. I also discussed with the patient that there may be a patient responsible charge related to this service. The patient expressed understanding and verbally consented to this telephonic visit.    Interactive audio and video telecommunications were attempted between this provider and patient, however failed, due to patient having technical difficulties OR patient did not have access to video capability.  We continued and completed visit with audio only.  Some vital signs may be absent or patient reported.   Time Spent with patient on telephone encounter: 20 minutes  Review of Systems     Cardiac Risk Factors include: advanced age (>53men, >54 women);hypertension     Objective:    Today's Vitals   01/22/21 1155  Weight: 211 lb (95.7 kg)  Height: 5\' 4"  (1.626 m)  PainSc: 2    Body mass index is 36.22 kg/m.  Advanced Directives 01/22/2021 12/31/2020 12/26/2020 02/28/2018 05/14/2014 12/06/2013 04/07/2012  Does Patient Have a Medical Advance Directive? No No No No No Yes Patient does not have advance directive  Type of Advance Directive - - - - - Press photographer;Living will -  Does patient want to make changes to medical advance directive? - - - No - Patient declined - No - Patient declined -  Copy of [place] in Chart? - - - - - No - copy requested -  Would patient like information on creating a medical advance directive? Yes  (MAU/Ambulatory/Procedural Areas - Information given) No - Patient declined Yes (MAU/Ambulatory/Procedural Areas - Information given) No - Patient declined No - patient declined information - -  Pre-existing out of facility DNR order (yellow form or pink MOST form) - - - - - - No    Current Medications (verified) Outpatient Encounter Medications as of 01/22/2021  Medication Sig   acetaminophen (TYLENOL) 500 MG tablet Take 1,000 mg by mouth in the morning, at noon, and at bedtime.   clonazePAM (KLONOPIN) 1 MG tablet Take 1 tablet (1 mg total) by mouth at bedtime.   hydrochlorothiazide (HYDRODIURIL) 12.5 MG tablet Take 1 tablet (12.5 mg total) by mouth daily.   losartan (COZAAR) 50 MG tablet Take 1 tablet (50 mg total) by mouth daily.   pantoprazole (PROTONIX) 40 MG tablet Take 1 tablet (40 mg total) by mouth daily.   Probiotic Product (PROBIOTIC DAILY PO) Take 1 capsule by mouth daily. PB8 Probiotic   promethazine-dextromethorphan (PROMETHAZINE-DM) 6.25-15 MG/5ML syrup Take 5 mLs by mouth 4 (four) times daily as needed for cough.   traMADol (ULTRAM) 50 MG tablet TAKE 1 TABLET (50 MG TOTAL) BY MOUTH 2 (TWO) TIMES DAILY AS NEEDED   traZODone (DESYREL) 100 MG tablet Take 2 tablets (200 mg total) by mouth at bedtime.   TURMERIC PO Take by mouth in the morning, at noon, and at bedtime.   No facility-administered encounter medications on file as of 01/22/2021.    Allergies (verified) Prednisone, Codeine, and Hydrocodone   History: Past Medical History:  Diagnosis Date   Allergy  seasonal   Arthritis    mild   Cancer ([place])    skin   Cardiac arrest ([place])    Circadian rhythm sleep disorder, shift work type [id number]/73/7106   Complication of anesthesia    nausea and vomiting   GERD (gastroesophageal reflux disease)    Hypertension    Insomnia secondary to restless leg syndrome 07/17/2013   Kidney stones    OSA on CPAP    PONV (postoperative nausea and vomiting)    Sleep apnea    cpap    Past Surgical History:  Procedure Laterality Date   acl repair left knee     APPENDECTOMY     BACK SURGERY  2017   nerve blocks   CARDIAC CATHETERIZATION  2005   negative    CHOLECYSTECTOMY     CYSTOSCOPY/RETROGRADE/URETEROSCOPY/STONE EXTRACTION WITH BASKET  04/07/2012   Procedure: CYSTOSCOPY/RETROGRADE/URETEROSCOPY/STONE EXTRACTION WITH BASKET;  Surgeon: Malka So, MD;  Location: [facility];  Service: Urology;  Laterality: Bilateral;  cystoscopy, bilateral stent removal, bilateral ureteroscopy with bilateral stone extraction and insertion of right ureteral stent   fusion great toe right foot     ligaments repair left wrist      ROTATOR CUFF REPAIR     left shoulder    skin cancer removed from left cheek     stone extractions and urethral stents     multiple   tendons cut on left heel     TONSILLECTOMY     VARICOCELE EXCISION     Family History  Problem Relation Age of Onset   Heart disease Mother        CABG   Bladder Cancer Mother        bladder cancer   Hypertension Father    Lung cancer Father        lung cancer after asbestosis   Diabetes Brother    Alcohol abuse Brother    Depression Brother    Hypertension Brother    Kidney disease Brother    Heart disease Maternal Grandmother    Heart attack Maternal Grandfather    Stroke Maternal Grandfather    Bone cancer Paternal Grandfather    Colon cancer Neg Hx    Social History   Socioeconomic History   Marital status: Married    Spouse name: Programmer, systems   Number of children: 2   Years of education: college   Highest education level: Not on file  Occupational History   Occupation: Dealer 1    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  Tobacco Use   Smoking status: Never   Smokeless tobacco: Never  Scientific laboratory technician Use: Never used  Substance and Sexual Activity   Alcohol use: No    Alcohol/week: 0.0 standard drinks   Drug use: No   Sexual activity: Yes    Birth  control/protection: Surgical  Other Topics Concern   Not on file  Social History Narrative   08/15/19   From: the area   Living: with wife, [name] (1975)   Work: Retired from [employer], works as a prn Environmental education officer      Family: 2 children - Ausencio Vaden and [place] - 3 grandsons      Enjoys: golf, fish (when back doesn't bother him)      Exercise: stretches when back is good   Diet: low carb to try to lose weight      Safety   Seat belts: Yes    Guns: Yes  and not secure   Safe in relationships: Yes    Social Determinants of Health   Financial Resource Strain: Low Risk    Difficulty of Paying Living Expenses: Not hard at all  Food Insecurity: No Food Insecurity   Worried About Charity fundraiser in the Last Year: Never true   Arboriculturist in the Last Year: Never true  Transportation Needs: No Transportation Needs   Lack of Transportation (Medical): No   Lack of Transportation (Non-Medical): No  Physical Activity: Insufficiently Active   Days of Exercise per Week: 4 days   Minutes of Exercise per Session: 30 min  Stress: No Stress Concern Present   Feeling of Stress : Not at all  Social Connections: Socially Integrated   Frequency of Communication with Friends and Family: More than three times a week   Frequency of Social Gatherings with Friends and Family: More than three times a week   Attends Religious Services: More than 4 times per year   Active Member of [employer] or Organizations: Yes   Attends Music therapist: More than 4 times per year   Marital Status: Married    Tobacco Counseling Counseling given: Not Answered   Clinical Intake:  Pre-visit preparation completed: Yes  Pain : 0-10 Pain Score: 2  Pain Location: Knee Pain Orientation: Right, Left     BMI - recorded: 36.22 Nutritional Status: BMI > 30  Obese Nutritional Risks: None Diabetes: No  How often do you need to have someone help you when you read instructions, pamphlets, or  other written materials from your doctor or pharmacy?: 1 - Never  Diabetic?No  Interpreter Needed?: No  Information entered by :: Orrin Brigham LPN   Activities of Daily Living In your present state of health, do you have any difficulty performing the following activities: 01/22/2021 12/26/2020  Hearing? N N  Comment patient states decreased hearing in both ears -  Vision? N N  Difficulty concentrating or making decisions? N N  Walking or climbing stairs? N Y  Comment - when knee bothersome  Dressing or bathing? N N  Doing errands, shopping? N N  Preparing Food and eating ? N N  Using the Toilet? N N  In the past six months, have you accidently leaked urine? N N  Do you have problems with loss of bowel control? N N  Managing your Medications? N N  Managing your Finances? N N  Housekeeping or managing your Housekeeping? N N  Some recent data might be hidden    Patient Care Team: Lesleigh Noe, MD as PCP - General (Family Medicine) Pyrtle, Lajuan Lines, MD as Consulting Physician (Gastroenterology) Bryson Ha, OD (Optometry) Irine Seal, MD as Attending Physician (Urology)  Indicate any recent Medical Services you may have received from other than [facility] providers in the past year (date may be approximate).     Assessment:   This is a routine wellness examination for Jaquail.  Hearing/Vision screen Hearing Screening - Comments:: Decrease hearing both ears Vision Screening - Comments:: Upcoming exam 01/24/21, Dr. Kerin Ransom, wears reading glasses  Dietary issues and exercise activities discussed: Current Exercise Habits: Home exercise routine, Type of exercise: walking, Time (Minutes): 30, Frequency (Times/Week): 4, Weekly Exercise (Minutes/Week): 120, Intensity: Mild   Goals Addressed             This Visit's Progress    Patient Stated       Would like to maintain current healthy eating habits  Depression Screen PHQ 2/9 Scores 01/22/2021 12/26/2020 01/11/2020  12/09/2018 09/06/2018 06/16/2018 04/29/2018  PHQ - 2 Score 0 0 0 0 0 0 0    Fall Risk Fall Risk  01/22/2021 12/26/2020 01/11/2020 12/09/2018 09/06/2018  Falls in the past year? 0 0 1 0 1  Comment - - - - -  Number falls in past yr: 0 0 0 0 0  Comment - - - - -  Injury with Fall? 0 0 0 0 1  Comment - - - - concussion  Risk for fall due to : No Fall Risks Orthopedic patient - - -  Follow up Falls prevention discussed Falls evaluation completed - - Falls evaluation completed    [place]:  Any stairs in or around the home? Yes  If so, are there any without handrails? No  Home free of loose throw rugs in walkways, pet beds, electrical cords, etc? Yes  Adequate lighting in your home to reduce risk of falls? Yes   ASSISTIVE DEVICES UTILIZED TO PREVENT FALLS:  Life alert? No  Use of a cane, walker or w/c? No  Grab bars in the bathroom? Yes  Shower chair or bench in shower? No  Elevated toilet seat or a handicapped toilet? Yes   TIMED UP AND GO:  Was the test performed? No , visit completed over the phone.    Cognitive Function:  Normal cognitive status assessed by this Nurse Health Advisor. No abnormalities found.        Immunizations Immunization History  Administered Date(s) Administered   Fluad Quad(high Dose 65+) 12/26/2020   Influenza Inj Mdck Quad Pf 12/23/2016   Influenza Split 03/02/2014, 11/30/2016   Influenza Whole 12/21/2011   Influenza,inj,Quad PF,6+ Mos 12/08/2012, 11/06/2014, 01/18/2016, 12/30/2017, 10/27/2018, 12/14/2019   PFIZER(Purple Top)SARS-COV-2 Vaccination 05/25/2019, 06/20/2019, 12/28/2019, 08/01/2020   PNEUMOCOCCAL CONJUGATE-20 08/01/2020   Pneumococcal Conjugate-13 01/11/2020   Pneumococcal Polysaccharide-23 05/09/2015   Tdap 04/30/2009, 03/19/2013   Zoster Recombinat (Shingrix) 10/27/2018, 07/22/2019   Zoster, Live 01/01/2016    TDAP status: Up to date  Flu Vaccine status: Up to date  Pneumococcal vaccine  status: Up to date  Covid-19 vaccine status: Information provided on how to obtain vaccines.   Qualifies for Shingles Vaccine? Yes   Zostavax completed Yes   Shingrix Completed?: Yes  Screening Tests Health Maintenance  Topic Date Due   COVID-19 Vaccine (5 - Booster for Pfizer series) 09/26/2020   TETANUS/TDAP  03/20/2023   COLONOSCOPY (Pts 45-76yrs Insurance coverage will need to be confirmed)  05/13/2024   Pneumonia Vaccine 36+ Years old  Completed   INFLUENZA VACCINE  Completed   Hepatitis C Screening  Completed   Zoster Vaccines- Shingrix  Completed   HPV VACCINES  Aged Out    Health Maintenance  Health Maintenance Due  Topic Date Due   COVID-19 Vaccine (5 - Booster for [place] series) 09/26/2020    Colorectal cancer screening: Type of screening: Colonoscopy. Completed 05/14/14. Repeat every 10 years  Lung Cancer Screening: (Low Dose CT Chest recommended if Age 12-80 years, 30 pack-year currently smoking OR have quit w/in 15years.) does not qualify.    Additional Screening:  Hepatitis C Screening: does qualify; Completed 05/09/15  Vision Screening: Recommended annual ophthalmology exams for early detection of glaucoma and other disorders of the eye. Is the patient up to date with their annual eye exam?  No , patient has a scheduled appointment on 01/24/21 Who is the provider or what is the name of  the office in which the patient attends annual eye exams? Dr. Kerin Ransom   Dental Screening: Recommended annual dental exams for proper oral hygiene  Community Resource Referral / Chronic Care Management: CRR required this visit?  No   CCM required this visit?  No      Plan:     I have personally reviewed and noted the following in the patient's chart:   Medical and social history Use of alcohol, tobacco or illicit drugs  Current medications and supplements including opioid prescriptions. Patient is not currently taking opioid prescriptions. Functional ability and  status Nutritional status Physical activity Advanced directives List of other physicians Hospitalizations, surgeries, and ER visits in previous 12 months Vitals Screenings to include cognitive, depression, and falls Referrals and appointments  In addition, I have reviewed and discussed with patient certain preventive protocols, quality metrics, and best practice recommendations. A written personalized care plan for preventive services as well as general preventive health recommendations were provided to patient.   Due to this being a telephonic visit, the after visit summary with patients personalized plan was offered to patient via mail or my-chart. Patient would like to access on my-chart.   Loma Messing, LPN   [id number]   Nurse Health Advisor  Nurse Notes: none

## 2021-01-22 ENCOUNTER — Ambulatory Visit: Payer: Medicare HMO

## 2021-01-22 VITALS — Ht 64.0 in | Wt 211.0 lb

## 2021-01-22 DIAGNOSIS — Z0000 Encounter for general adult medical examination without abnormal findings: Secondary | ICD-10-CM

## 2021-01-22 NOTE — Patient Instructions (Signed)
Mr. Schirtzinger , Thank you for taking time to complete for your Medicare Wellness Visit. I appreciate your ongoing commitment to your health goals. Please review the following plan we discussed and let me know if I can assist you in the future.   Screening recommendations/referrals: Colonoscopy: up to date , completed 05/14/14, due 05/13/24 Recommended yearly ophthalmology/optometry visit for glaucoma screening and checkup Recommended yearly dental visit for hygiene and checkup  Vaccinations: Influenza vaccine: up to date Pneumococcal vaccine: up to date Tdap vaccine: up to date, completed 03/19/13, due 03/20/23 Shingles vaccine: up to date   Covid-19:  newest booster available at your local pharmacy  Advanced directives: Please bring a copy of Living Will and/or [place] for your chart.   Conditions/risks identified: see problem list  Next appointment: Follow up in one year for your annual wellness visit.   Preventive Care 14 Years and Older, Male Preventive care refers to lifestyle choices and visits with your health care provider that can promote health and wellness. What does preventive care include? A yearly physical exam. This is also called an annual well check. Dental exams once or twice a year. Routine eye exams. Ask your health care provider how often you should have your eyes checked. Personal lifestyle choices, including: Daily care of your teeth and gums. Regular physical activity. Eating a healthy diet. Avoiding tobacco and drug use. Limiting alcohol use. Practicing safe sex. Taking low doses of aspirin every day. Taking vitamin and mineral supplements as recommended by your health care provider. What happens during an annual well check? The services and screenings done by your health care provider during your annual well check will depend on your age, overall health, lifestyle risk factors, and family history of disease. Counseling  Your health care  provider may ask you questions about your: Alcohol use. Tobacco use. Drug use. Emotional well-being. Home and relationship well-being. Sexual activity. Eating habits. History of falls. Memory and ability to understand (cognition). Work and work Statistician. Screening  You may have the following tests or measurements: Height, weight, and BMI. Blood pressure. Lipid and cholesterol levels. These may be checked every 5 years, or more frequently if you are over 42 years old. Skin check. Lung cancer screening. You may have this screening every year starting at age 63 if you have a 30-pack-year history of smoking and currently smoke or have quit within the past 15 years. Fecal occult blood test (FOBT) of the stool. You may have this test every year starting at age 41. Flexible sigmoidoscopy or colonoscopy. You may have a sigmoidoscopy every 5 years or a colonoscopy every 10 years starting at age 16. Prostate cancer screening. Recommendations will vary depending on your family history and other risks. Hepatitis C blood test. Hepatitis B blood test. Sexually transmitted disease (STD) testing. Diabetes screening. This is done by checking your blood sugar (glucose) after you have not eaten for a while (fasting). You may have this done every 1-3 years. Abdominal aortic aneurysm (AAA) screening. You may need this if you are a current or former smoker. Osteoporosis. You may be screened starting at age 31 if you are at high risk. Talk with your health care provider about your test results, treatment options, and if necessary, the need for more tests. Vaccines  Your health care provider may recommend certain vaccines, such as: Influenza vaccine. This is recommended every year. Tetanus, diphtheria, and acellular pertussis (Tdap, Td) vaccine. You may need a Td booster every 10 years. Zoster  vaccine. You may need this after age 85. Pneumococcal 13-valent conjugate (PCV13) vaccine. One dose is  recommended after age 42. Pneumococcal polysaccharide (PPSV23) vaccine. One dose is recommended after age 26. Talk to your health care provider about which screenings and vaccines you need and how often you need them. This information is not intended to replace advice given to you by your health care provider. Make sure you discuss any questions you have with your health care provider. Document Released: 03/15/2015 Document Revised: 11/06/2015 Document Reviewed: 12/18/2014 Elsevier Interactive Patient Education  2017 [place] Prevention in the Home Falls can cause injuries. They can happen to people of all ages. There are many things you can do to make your home safe and to help prevent falls. What can I do on the outside of my home? Regularly fix the edges of walkways and driveways and fix any cracks. Remove anything that might make you trip as you walk through a door, such as a raised step or threshold. Trim any bushes or trees on the path to your home. Use bright outdoor lighting. Clear any walking paths of anything that might make someone trip, such as rocks or tools. Regularly check to see if handrails are loose or broken. Make sure that both sides of any steps have handrails. Any raised decks and porches should have guardrails on the edges. Have any leaves, snow, or ice cleared regularly. Use sand or salt on walking paths during winter. Clean up any spills in your garage right away. This includes oil or grease spills. What can I do in the bathroom? Use night lights. Install grab bars by the toilet and in the tub and shower. Do not use towel bars as grab bars. Use non-skid mats or decals in the tub or shower. If you need to sit down in the shower, use a plastic, non-slip stool. Keep the floor dry. Clean up any water that spills on the floor as soon as it happens. Remove soap buildup in the tub or shower regularly. Attach bath mats securely with double-sided non-slip rug  tape. Do not have throw rugs and other things on the floor that can make you trip. What can I do in the bedroom? Use night lights. Make sure that you have a light by your bed that is easy to reach. Do not use any sheets or blankets that are too big for your bed. They should not hang down onto the floor. Have a firm chair that has side arms. You can use this for support while you get dressed. Do not have throw rugs and other things on the floor that can make you trip. What can I do in the kitchen? Clean up any spills right away. Avoid walking on wet floors. Keep items that you use a lot in easy-to-reach places. If you need to reach something above you, use a strong step stool that has a grab bar. Keep electrical cords out of the way. Do not use floor polish or wax that makes floors slippery. If you must use wax, use non-skid floor wax. Do not have throw rugs and other things on the floor that can make you trip. What can I do with my stairs? Do not leave any items on the stairs. Make sure that there are handrails on both sides of the stairs and use them. Fix handrails that are broken or loose. Make sure that handrails are as long as the stairways. Check any carpeting to make sure that it  is firmly attached to the stairs. Fix any carpet that is loose or worn. Avoid having throw rugs at the top or bottom of the stairs. If you do have throw rugs, attach them to the floor with carpet tape. Make sure that you have a light switch at the top of the stairs and the bottom of the stairs. If you do not have them, ask someone to add them for you. What else can I do to help prevent falls? Wear shoes that: Do not have high heels. Have rubber bottoms. Are comfortable and fit you well. Are closed at the toe. Do not wear sandals. If you use a stepladder: Make sure that it is fully opened. Do not climb a closed stepladder. Make sure that both sides of the stepladder are locked into place. Ask someone to  hold it for you, if possible. Clearly mark and make sure that you can see: Any grab bars or handrails. First and last steps. Where the edge of each step is. Use tools that help you move around (mobility aids) if they are needed. These include: Canes. Walkers. Scooters. Crutches. Turn on the lights when you go into a dark area. Replace any light bulbs as soon as they burn out. Set up your furniture so you have a clear path. Avoid moving your furniture around. If any of your floors are uneven, fix them. If there are any pets around you, be aware of where they are. Review your medicines with your doctor. Some medicines can make you feel dizzy. This can increase your chance of falling. Ask your doctor what other things that you can do to help prevent falls. This information is not intended to replace advice given to you by your health care provider. Make sure you discuss any questions you have with your health care provider. Document Released: 12/13/2008 Document Revised: 07/25/2015 Document Reviewed: 03/23/2014 Elsevier Interactive Patient Education  2017 [employer].

## 2021-01-25 ENCOUNTER — Other Ambulatory Visit: Payer: Self-pay

## 2021-01-25 ENCOUNTER — Ambulatory Visit
Admission: RE | Admit: 2021-01-25 | Discharge: 2021-01-25 | Disposition: A | Discharge status: Home / Self Care | Payer: Medicare HMO | Source: Ambulatory Visit | Attending: Urology | Admitting: Urology

## 2021-01-25 DIAGNOSIS — R972 Elevated prostate specific antigen [PSA]: Secondary | ICD-10-CM

## 2021-01-25 DIAGNOSIS — N4289 Other specified disorders of prostate: Secondary | ICD-10-CM | POA: Diagnosis not present

## 2021-01-25 MED ORDER — GADOBENATE DIMEGLUMINE 529 MG/ML IV SOLN
20.0000 mL | Freq: Once | INTRAVENOUS | Status: AC | PRN
  Administered 2021-01-25: 20 mL via INTRAVENOUS

## 2021-02-05 DIAGNOSIS — R972 Elevated prostate specific antigen [PSA]: Secondary | ICD-10-CM | POA: Diagnosis not present

## 2021-02-05 DIAGNOSIS — C61 Malignant neoplasm of prostate: Secondary | ICD-10-CM | POA: Diagnosis not present

## 2021-02-13 ENCOUNTER — Other Ambulatory Visit: Payer: Self-pay | Admitting: Urology

## 2021-02-13 ENCOUNTER — Other Ambulatory Visit (HOSPITAL_COMMUNITY): Payer: Self-pay | Admitting: Urology

## 2021-02-13 DIAGNOSIS — C61 Malignant neoplasm of prostate: Secondary | ICD-10-CM

## 2021-02-14 ENCOUNTER — Telehealth: Payer: Self-pay | Admitting: Family Medicine

## 2021-02-14 NOTE — Telephone Encounter (Signed)
Pt called in requesting a call back has question and concerns [phone]

## 2021-02-17 NOTE — Telephone Encounter (Signed)
I don't see where I have ever seen this patient.  It looks like he had medicare visit with Dr. Einar Pheasant on 12/26/20. Please let me know if there is anything for me to address here.  Thanks.

## 2021-02-17 NOTE — Telephone Encounter (Signed)
I spoke with pt and pt said he was notified by Dr Jeffie Pollock the urologist at [facility] urology on 02/12/21 that pt has prostate cancer. Pt has appt on 03/06/2021 for CT of pelvis and a full body PET scan. Pt has appt to see Dr Jeffie Pollock on 03/12/2020.  2nd issue is pt said he wanted Dr Einar Pheasant and our office to be aware that pts insurance will not pay for more than one physical per year. Pt said Medicare is going to charge her $295.00 for the physical this year. Pt said she saw Dr Damita Dunnings in office for medicare wellness exam on 12/26/20 and pt did a visit with nurse on 01/22/21 for part of medicare wellness exam. Pt said that someone with [facility] is trying to get recoding done so pt will not have to pay the $295.00. sending note to [facility] CMA who is not in office today but should be in office on 02/18/21. Sending note to Dr Damita Dunnings who is lead physician. Pt was appreciative for call.

## 2021-02-18 NOTE — Telephone Encounter (Signed)
It looks like he had his [place] with Dr. Einar Pheasant on 12/26/20, and then our wellness nurse completed another [place] with him on 01/22/21.  Will send to [facility]. Is there any way we can remove the charge from the phone call with Tamina on 01/22/21?  Tamina, please always double check to make sure patient has not completed [place] with PCP within 365 days before conducing your visit.   Thank you guys!

## 2021-02-18 NOTE — Telephone Encounter (Signed)
Noted, thank you all!

## 2021-03-04 DIAGNOSIS — H524 Presbyopia: Secondary | ICD-10-CM | POA: Diagnosis not present

## 2021-03-04 DIAGNOSIS — I10 Essential (primary) hypertension: Secondary | ICD-10-CM | POA: Diagnosis not present

## 2021-03-04 DIAGNOSIS — Z0100 Encounter for examination of eyes and vision without abnormal findings: Secondary | ICD-10-CM | POA: Diagnosis not present

## 2021-03-06 ENCOUNTER — Encounter (HOSPITAL_COMMUNITY)
Admission: RE | Admit: 2021-03-06 | Discharge: 2021-03-06 | Disposition: A | Discharge status: Home / Self Care | Payer: Medicare HMO | Source: Ambulatory Visit | Attending: Urology | Admitting: Urology

## 2021-03-06 ENCOUNTER — Other Ambulatory Visit: Payer: Self-pay

## 2021-03-06 DIAGNOSIS — Z9049 Acquired absence of other specified parts of digestive tract: Secondary | ICD-10-CM | POA: Diagnosis not present

## 2021-03-06 DIAGNOSIS — N200 Calculus of kidney: Secondary | ICD-10-CM | POA: Diagnosis not present

## 2021-03-06 DIAGNOSIS — C61 Malignant neoplasm of prostate: Secondary | ICD-10-CM | POA: Diagnosis not present

## 2021-03-06 DIAGNOSIS — N281 Cyst of kidney, acquired: Secondary | ICD-10-CM | POA: Diagnosis not present

## 2021-03-06 MED ORDER — TECHNETIUM TC 99M MEDRONATE IV KIT
20.0000 | PACK | Freq: Once | INTRAVENOUS | Status: AC | PRN
  Administered 2021-03-06: 21.1 via INTRAVENOUS

## 2021-03-12 DIAGNOSIS — C61 Malignant neoplasm of prostate: Secondary | ICD-10-CM | POA: Diagnosis not present

## 2021-03-12 DIAGNOSIS — N5201 Erectile dysfunction due to arterial insufficiency: Secondary | ICD-10-CM | POA: Diagnosis not present

## 2021-04-01 DIAGNOSIS — C61 Malignant neoplasm of prostate: Secondary | ICD-10-CM | POA: Diagnosis not present

## 2021-04-03 DIAGNOSIS — C61 Malignant neoplasm of prostate: Secondary | ICD-10-CM | POA: Diagnosis not present

## 2021-04-03 DIAGNOSIS — R278 Other lack of coordination: Secondary | ICD-10-CM | POA: Diagnosis not present

## 2021-04-03 DIAGNOSIS — N400 Enlarged prostate without lower urinary tract symptoms: Secondary | ICD-10-CM | POA: Diagnosis not present

## 2021-04-07 NOTE — Progress Notes (Signed)
GU Location of Tumor / Histology: Prostate Ca  If Prostate Cancer, Gleason Score is (4 + 3) and PSA is (13.5 as of 11/2020)  Biopsies  Dr. Jeffie Pollock        Past/Anticipated interventions by urology, if any:   Past/Anticipated interventions by medical oncology, if any:   Weight changes, if any: No  IPSS:  0 SHIM:  23  Bowel/Bladder complaints, if any:  No bowel and bladder.  Nausea/Vomiting, if any:  No  Pain issues, if any:  0/10  SAFETY ISSUES: Prior radiation? No Pacemaker/ICD? No Possible current pregnancy?  Male Is the patient on methotrexate?  No  Current Complaints / other details:  Need information on treatment options.

## 2021-04-08 ENCOUNTER — Other Ambulatory Visit: Payer: Self-pay

## 2021-04-08 ENCOUNTER — Ambulatory Visit
Admission: RE | Admit: 2021-04-08 | Discharge: 2021-04-08 | Disposition: A | Discharge status: Home / Self Care | Payer: Medicare HMO | Source: Ambulatory Visit | Attending: Radiation Oncology | Admitting: Radiation Oncology

## 2021-04-08 DIAGNOSIS — C61 Malignant neoplasm of prostate: Secondary | ICD-10-CM | POA: Insufficient documentation

## 2021-04-08 NOTE — Progress Notes (Signed)
Radiation Oncology         [phone] ________________________________  Initial Outpatient Consultation - Conducted via Telephone due to current COVID-19 concerns for limiting patient exposure  Name: Casey WHITENIGHT Sr. MRN: [id number]  Date: 04/08/2021  DOB: Jul 07, 1955  JT:TSVX, Jobe Marker, MD  Irine Seal, MD   REFERRING PHYSICIAN: Irine Seal, MD  DIAGNOSIS: 66 y.o. gentleman with Stage T1c adenocarcinoma of the prostate with Gleason score of 4+4, and PSA of 13.5.    ICD-10-CM   1. Malignant neoplasm of prostate ([place])  C61       HISTORY OF PRESENT ILLNESS: Casey [name]. is a 66 y.o. male with a diagnosis of prostate cancer. He has a history of BPH with BOO, prostatitis and an elevated PSA with a prior negative prostate biopsy in 2012 when his PSA was between 5-6. His PSA increased to 9.13 in 2018 but he did not follow up thereafter. He was lost to follow up after 2018 but returned to Dr. Jeffie Pollock on 12/30/20,  referred by his PCP, Dr. Einar Pheasant, and digital rectal examination was performed at that time revealing no nodules. PSA performed that day showed a further rise in PSA to 13.5.  Therefore, he underwent prostate MRI on 01/25/21 showing a 1.9 cm PI-RADS 5 lesion in the peripheral zone of the left posterolateral mid gland and apex,  with probable extracapsular extension but no evidence of neurovascular bundle involvement or pelvic metastatic disease. The patient proceeded to transrectal ultrasound with 15 biopsies of the prostate on 02/05/21.  The prostate volume measured 50 cc.  Out of 12 core biopsies, 9 were positive.  The maximum Gleason score was 4+4, and this was seen in 2 of 3 MRI ROI samples, as well as the left apex lateral, and left apex. Additionally, Gleason 4+3 was seen in the left mid, left mid lateral, left base lateral, and 1 of 3 MRI ROI samples and Gleason 3+3 was seen in the right apex lateral.  He underwent staging CT AP on 03/06/21 showing no evidence of metastatic disease. A  bone scan performed the same day was also negative for osseous metastatic disease.  The patient reviewed the biopsy results with his urologist and he has kindly been referred today for discussion of potential radiation treatment options. He has met with Dr. Alinda Money on 04/01/21 to discuss prostatectomy. Dr. Alinda Money ordered a PSMA PET scan to complete his disease staging but this has not been scheduled as they are still awaiting insurance approval.  Hopefully, he will have the scan in the near future.   PREVIOUS RADIATION THERAPY: No  PAST MEDICAL HISTORY:  Past Medical History:  Diagnosis Date   Allergy    seasonal   Arthritis    mild   Cancer ([place])    skin   Cardiac arrest ([place])    Circadian rhythm sleep disorder, shift work type 7/93/9030   Complication of anesthesia    nausea and vomiting   GERD (gastroesophageal reflux disease)    Hypertension    Insomnia secondary to restless leg syndrome 07/17/2013   Kidney stones    OSA on CPAP    PONV (postoperative nausea and vomiting)    Sleep apnea    cpap      PAST SURGICAL HISTORY: Past Surgical History:  Procedure Laterality Date   acl repair left knee     APPENDECTOMY     BACK SURGERY  2017   nerve blocks   CARDIAC CATHETERIZATION  2005   negative  CHOLECYSTECTOMY     CYSTOSCOPY/RETROGRADE/URETEROSCOPY/STONE EXTRACTION WITH BASKET  04/07/2012   Procedure: CYSTOSCOPY/RETROGRADE/URETEROSCOPY/STONE EXTRACTION WITH BASKET;  Surgeon: Malka So, MD;  Location: [facility];  Service: Urology;  Laterality: Bilateral;  cystoscopy, bilateral stent removal, bilateral ureteroscopy with bilateral stone extraction and insertion of right ureteral stent   fusion great toe right foot     ligaments repair left wrist      ROTATOR CUFF REPAIR     left shoulder    skin cancer removed from left cheek     stone extractions and urethral stents     multiple   tendons cut on left heel     TONSILLECTOMY     VARICOCELE EXCISION      FAMILY HISTORY:   Family History  Problem Relation Age of Onset   Heart disease Mother        CABG   Bladder Cancer Mother        bladder cancer   Hypertension Father    Lung cancer Father        lung cancer after asbestosis   Diabetes Brother    Alcohol abuse Brother    Depression Brother    Hypertension Brother    Kidney disease Brother    Heart disease Maternal Grandmother    Heart attack Maternal Grandfather    Stroke Maternal Grandfather    Bone cancer Paternal Grandfather    Colon cancer Neg Hx     SOCIAL HISTORY:  Social History   Socioeconomic History   Marital status: Married    Spouse name: Programmer, systems   Number of children: 2   Years of education: college   Highest education level: Not on file  Occupational History   Occupation: Dealer 1    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  Tobacco Use   Smoking status: Never   Smokeless tobacco: Never  Scientific laboratory technician Use: Never used  Substance and Sexual Activity   Alcohol use: No    Alcohol/week: 0.0 standard drinks   Drug use: No   Sexual activity: Yes    Birth control/protection: Surgical  Other Topics Concern   Not on file  Social History Narrative   08/15/19   From: the area   Living: with wife, Casey [name] (1975)   Work: Retired from [employer], works as a prn Environmental education officer      Family: 2 children - Casey [name] and Casey [name] - 3 grandsons      Enjoys: golf, fish (when back doesn't bother him)      Exercise: stretches when back is good   Diet: low carb to try to lose weight      Safety   Seat belts: Yes    Guns: Yes  and not secure   Safe in relationships: Yes    Social Determinants of Radio broadcast assistant Strain: Low Risk    Difficulty of Paying Living Expenses: Not hard at all  Food Insecurity: No Food Insecurity   Worried About Charity fundraiser in the Last Year: Never true   Ran Out of Food in the Last Year: Never true  Transportation Needs: No Transportation Needs    Lack of Transportation (Medical): No   Lack of Transportation (Non-Medical): No  Physical Activity: Insufficiently Active   Days of Exercise per Week: 4 days   Minutes of Exercise per Session: 30 min  Stress: No Stress Concern Present   Feeling of Stress : Not at all  Social Connections: Engineer, building services of Communication with Friends and Family: More than three times a week   Frequency of Social Gatherings with Friends and Family: More than three times a week   Attends Religious Services: More than 4 times per year   Active Member of [employer] or Organizations: Yes   Attends Music therapist: More than 4 times per year   Marital Status: Married  Human resources officer Violence: Not At Risk   Fear of Current or Ex-Partner: No   Emotionally Abused: No   Physically Abused: No   Sexually Abused: No    ALLERGIES: Prednisone, Codeine, and Hydrocodone  MEDICATIONS:  Current Outpatient Medications  Medication Sig Dispense Refill   acetaminophen (TYLENOL) 500 MG tablet Take 1,000 mg by mouth in the morning, at noon, and at bedtime.     clonazePAM (KLONOPIN) 1 MG tablet Take 1 tablet (1 mg total) by mouth at bedtime. 100 tablet 1   hydrochlorothiazide (HYDRODIURIL) 12.5 MG tablet Take 1 tablet (12.5 mg total) by mouth daily. 90 tablet 3   losartan (COZAAR) 50 MG tablet Take 1 tablet (50 mg total) by mouth daily. 90 tablet 3   pantoprazole (PROTONIX) 40 MG tablet Take 1 tablet (40 mg total) by mouth daily. 90 tablet 1   Probiotic Product (PROBIOTIC DAILY PO) Take 1 capsule by mouth daily. PB8 Probiotic     traMADol (ULTRAM) 50 MG tablet TAKE 1 TABLET (50 MG TOTAL) BY MOUTH 2 (TWO) TIMES DAILY AS NEEDED 60 tablet 0   traZODone (DESYREL) 100 MG tablet Take 2 tablets (200 mg total) by mouth at bedtime. 180 tablet 3   promethazine-dextromethorphan (PROMETHAZINE-DM) 6.25-15 MG/5ML syrup Take 5 mLs by mouth 4 (four) times daily as needed for cough. (Patient not taking: Reported on  04/08/2021) 118 mL 0   TURMERIC PO Take by mouth in the morning, at noon, and at bedtime. (Patient not taking: Reported on 04/08/2021)     No current facility-administered medications for this encounter.    REVIEW OF SYSTEMS:  On review of systems, the patient reports that he is doing well overall. He denies any chest pain, shortness of breath, cough, fevers, chills, night sweats, unintended weight changes. He denies any bowel disturbances, and denies abdominal pain, nausea or vomiting. He denies any new musculoskeletal or joint aches or pains. His IPSS was 0, indicating no urinary symptoms. His SHIM was 23, indicating he does not have erectile dysfunction. A complete review of systems is obtained and is otherwise negative.    PHYSICAL EXAM:  Wt Readings from Last 3 Encounters:  04/08/21 210 lb (95.3 kg)  01/22/21 211 lb (95.7 kg)  01/01/21 211 lb (95.7 kg)   Temp Readings from Last 3 Encounters:  12/31/20 98.2 F (36.8 C) (Oral)  12/26/20 (!) 96.2 F (35.7 C) (Temporal)  07/25/20 98.2 F (36.8 C) (Temporal)   BP Readings from Last 3 Encounters:  12/31/20 (!) 147/96  12/26/20 126/82  10/04/20 136/80   Pulse Readings from Last 3 Encounters:  12/31/20 72  12/26/20 67  10/04/20 73   Pain Assessment Pain Score: 0-No pain/10  Physical exam not performed in light of telephone consult visit format.   KPS = 100  100 - Normal; no complaints; no evidence of disease. 90   - Able to carry on normal activity; minor signs or symptoms of disease. 80   - Normal activity with effort; some signs or symptoms of disease. 11   - Cares for self; unable  to carry on normal activity or to do active work. 60   - Requires occasional assistance, but is able to care for most of his personal needs. 50   - Requires considerable assistance and frequent medical care. 66   - Disabled; requires special care and assistance. 29   - Severely disabled; hospital admission is indicated although death not  imminent. 20   - Very sick; hospital admission necessary; active supportive treatment necessary. 10   - Moribund; fatal processes progressing rapidly. 0     - Dead  Karnofsky DA, Abelmann [place], Craver LS and [facility] [phone]) The use of the nitrogen mustards in the palliative treatment of carcinoma: with particular reference to bronchogenic carcinoma Cancer 1 634-56  LABORATORY DATA:  Lab Results  Component Value Date   WBC 6.4 12/26/2020   HGB 15.6 12/26/2020   HCT 45.5 12/26/2020   MCV 90.1 12/26/2020   PLT 225.0 12/26/2020   Lab Results  Component Value Date   NA 139 12/26/2020   K 4.1 12/26/2020   CL 100 12/26/2020   CO2 30 12/26/2020   Lab Results  Component Value Date   ALT 19 12/26/2020   AST 18 12/26/2020   ALKPHOS 59 12/26/2020   BILITOT 1.1 12/26/2020     RADIOGRAPHY: No results found.    IMPRESSION/PLAN: This visit was conducted via Telephone to spare the patient unnecessary potential exposure in the healthcare setting during the current COVID-19 pandemic. 1. 66 y.o. gentleman with Stage T1c adenocarcinoma of the prostate with Gleason Score of 4+4, and PSA of 13.5. We discussed the patient's workup and outlined the nature of prostate cancer in this setting. The patient's T stage, Gleason's score, and PSA put him into the high risk group. Accordingly, he is eligible for a variety of potential treatment options including prostatectomy or LT-ADT in combination with either 8 weeks of external radiation or 5 weeks of external radiation preceded by a brachytherapy boost. We discussed the available radiation techniques, and focused on the details and logistics of delivery. We discussed and outlined the risks, benefits, short and long-term effects associated with radiotherapy and compared and contrasted these with prostatectomy. We also detailed the role of ADT in the treatment of high risk prostate cancer and outlined the associated side effects that could be expected with this  therapy.   The patient focused most of his questions and interest in robotic-assisted laparoscopic radical prostatectomy.  We discussed some of the potential advantages of surgery including surgical staging, the availability of salvage radiotherapy to the prostatic fossa, and the confidence associated with immediate biochemical response. We discussed some of the potential proven indications for postoperative radiotherapy including positive margins, extracapsular extension, and seminal vesicle involvement. We also talked about some of the other potential findings leading to a recommendation for radiotherapy including a non-zero postoperative PSA and positive lymph nodes. He appears to have a good understanding of his disease and our treatment recommendations which are of curative intent.  He was encouraged to ask questions that were answered to his stated satisfaction.  At the end of the conversation the patient is most interested in proceeding with robotic-assisted laparoscopic radical prostatectomy under the care of of Dr. Alinda Money. They are working on scheduling the patient for a PSMA PET scan as soon as they have insurance approval, prior to scheduling his procedure.  We will share our discussion with Dr. Jeffie Pollock and Dr. Alinda Money so that they can proceed with treatment planning accordingly.  We enjoyed meeting him  today, and we will look forward to following his progress.  He knows that he is welcome to call with any further questions associated with radiation.  Given current concerns for patient exposure during the COVID-19 pandemic, this encounter was conducted via telephone. The patient was notified in advance and was offered a MyChart meeting to allow for face to face communication but unfortunately reported that he did not have the appropriate resources/technology to support such a visit and instead preferred to proceed with telephone consult. The patient has given verbal consent for this type of  encounter. The attendants for this meeting include Tyler Pita MD, Ashlyn Bruning PA-C, and patient, Casey [name] Sr.  During the encounter, Tyler Pita MD and Freeman Caldron PA-C were located at [facility] Radiation Oncology Department.  Patient, DEMORIO SEELEY Sr. was located at home.    Nicholos Johns, PA-C    Tyler Pita, MD  [facility] Direct Dial: [phone]   Fax: [phone] [email or web address]   Skype   LinkedIn   This document serves as a record of services personally performed by Tyler Pita, MD and Freeman Caldron, PA-C. It was created on their behalf by Wilburn Mylar, a trained medical scribe. The creation of this record is based on the scribe's personal observations and the provider's statements to them. This document has been checked and approved by the attending provider.

## 2021-04-15 ENCOUNTER — Other Ambulatory Visit (HOSPITAL_COMMUNITY): Payer: Self-pay | Admitting: Urology

## 2021-04-15 DIAGNOSIS — C61 Malignant neoplasm of prostate: Secondary | ICD-10-CM

## 2021-04-21 DIAGNOSIS — R972 Elevated prostate specific antigen [PSA]: Secondary | ICD-10-CM | POA: Diagnosis not present

## 2021-04-21 DIAGNOSIS — C61 Malignant neoplasm of prostate: Secondary | ICD-10-CM | POA: Diagnosis not present

## 2021-04-22 NOTE — Progress Notes (Signed)
Patient saw Dr. Jeffie Pollock on 2/20 and has decided to proceed with LT ADT followed by daily radiation.  Pt will have PET PSMA on 2/28,and then will receive ADT.   RN left voicemail with patient requesting call back to confirm decision and access navigation needs.

## 2021-04-25 DIAGNOSIS — H26491 Other secondary cataract, right eye: Secondary | ICD-10-CM | POA: Diagnosis not present

## 2021-04-28 ENCOUNTER — Other Ambulatory Visit (HOSPITAL_COMMUNITY): Payer: Medicare HMO

## 2021-04-29 ENCOUNTER — Other Ambulatory Visit: Payer: Self-pay

## 2021-04-29 ENCOUNTER — Encounter (HOSPITAL_COMMUNITY)
Admission: RE | Admit: 2021-04-29 | Discharge: 2021-04-29 | Disposition: A | Discharge status: Home / Self Care | Payer: Medicare HMO | Source: Ambulatory Visit | Attending: Urology | Admitting: Urology

## 2021-04-29 DIAGNOSIS — N281 Cyst of kidney, acquired: Secondary | ICD-10-CM | POA: Diagnosis not present

## 2021-04-29 DIAGNOSIS — K5730 Diverticulosis of large intestine without perforation or abscess without bleeding: Secondary | ICD-10-CM | POA: Diagnosis not present

## 2021-04-29 DIAGNOSIS — C61 Malignant neoplasm of prostate: Secondary | ICD-10-CM | POA: Diagnosis not present

## 2021-04-29 DIAGNOSIS — N200 Calculus of kidney: Secondary | ICD-10-CM | POA: Diagnosis not present

## 2021-04-29 MED ORDER — PIFLIFOLASTAT F 18 (PYLARIFY) INJECTION
9.0000 | Freq: Once | INTRAVENOUS | Status: AC
  Administered 2021-04-29: 9.6 via INTRAVENOUS

## 2021-05-07 ENCOUNTER — Other Ambulatory Visit: Payer: Self-pay | Admitting: Urology

## 2021-05-07 DIAGNOSIS — C61 Malignant neoplasm of prostate: Secondary | ICD-10-CM | POA: Diagnosis not present

## 2021-05-07 DIAGNOSIS — N200 Calculus of kidney: Secondary | ICD-10-CM | POA: Diagnosis not present

## 2021-05-07 DIAGNOSIS — E041 Nontoxic single thyroid nodule: Secondary | ICD-10-CM | POA: Diagnosis not present

## 2021-05-07 DIAGNOSIS — E042 Nontoxic multinodular goiter: Secondary | ICD-10-CM

## 2021-05-08 ENCOUNTER — Other Ambulatory Visit: Payer: Self-pay | Admitting: Urology

## 2021-05-08 DIAGNOSIS — E042 Nontoxic multinodular goiter: Secondary | ICD-10-CM

## 2021-05-13 ENCOUNTER — Ambulatory Visit
Admission: RE | Admit: 2021-05-13 | Discharge: 2021-05-13 | Disposition: A | Discharge status: Home / Self Care | Payer: Medicare HMO | Source: Ambulatory Visit | Attending: Urology | Admitting: Urology

## 2021-05-13 DIAGNOSIS — E042 Nontoxic multinodular goiter: Secondary | ICD-10-CM | POA: Diagnosis not present

## 2021-05-22 ENCOUNTER — Other Ambulatory Visit: Payer: Self-pay | Admitting: Urology

## 2021-06-03 DIAGNOSIS — C61 Malignant neoplasm of prostate: Secondary | ICD-10-CM | POA: Diagnosis not present

## 2021-06-04 ENCOUNTER — Encounter (HOSPITAL_BASED_OUTPATIENT_CLINIC_OR_DEPARTMENT_OTHER): Payer: Self-pay | Admitting: Urology

## 2021-06-10 ENCOUNTER — Encounter (HOSPITAL_BASED_OUTPATIENT_CLINIC_OR_DEPARTMENT_OTHER): Payer: Self-pay | Admitting: Urology

## 2021-06-10 ENCOUNTER — Other Ambulatory Visit: Payer: Self-pay

## 2021-06-10 DIAGNOSIS — Z5111 Encounter for antineoplastic chemotherapy: Secondary | ICD-10-CM | POA: Diagnosis not present

## 2021-06-10 DIAGNOSIS — C61 Malignant neoplasm of prostate: Secondary | ICD-10-CM | POA: Diagnosis not present

## 2021-06-11 ENCOUNTER — Encounter (HOSPITAL_BASED_OUTPATIENT_CLINIC_OR_DEPARTMENT_OTHER): Payer: Self-pay | Admitting: Urology

## 2021-06-11 NOTE — Progress Notes (Addendum)
Spoke w/ via phone for pre-op interview---pt ?Lab needs dos----  ekg, istat             ?Lab results------see below ?COVID test -----patient states asymptomatic no test needed ?Arrive at -------530 am 06-13-2021 ?NPO after MN NO Solid Food.  Clear liquids from MN until---430 am ?Med rec completed ?Medications to take morning of surgery -----Pantopraxole, Tramadol prn, Zyrtec ?Diabetic medication -----n/a ?Patient instructed no nail polish to be worn day of surgery ?Patient instructed to bring photo id and insurance card day of surgery ?Patient aware to have Driver (ride ) / caregiver wife cindy cell [phone]    for 24 hours after surgery  ?Patient Special Instructions -----fleets enema hs night before surgery ?Pre-Op special Istructions -----none ?Patient verbalized understanding of instructions that were given at this phone interview. ?Patient denies  any cardiac symptoms, shortness of breath, chest pain,  ,no fever, cough at this phone interview. Patient states at phone interview on 06-10-2021 that he can climb flight of stairs without difficulty. ? ?Cardiology lov dr b Bettina Gavia 09-11-2019 epic ?Ct coronary morph 10-10-2019 epic ?Echo 02-27-2018 chart/ epic ?Pet scan 04-29-2021 epic ?Chest xray 1 view 12-31-2020 epic ? ? ?Reviewed patient medical history with dr e fitzgerald mda and pt ok for 06-13-2021 surgery [facility] per dr e fitzgerald mda. ? ?

## 2021-06-12 ENCOUNTER — Encounter (HOSPITAL_BASED_OUTPATIENT_CLINIC_OR_DEPARTMENT_OTHER): Payer: Self-pay | Admitting: Urology

## 2021-06-12 NOTE — H&P (Signed)
I have prostate cancer. ?  ?  ? 05/07/21: Mr. Haden returns today to initial ADT. A PET scan showed only prostatitic uptake of the tracer but he has a 1.8cm right thyroid nodule and 2 left renal stones, 10 and 1m in size. He has a Thyroid UKoreaon order. He has not been scheduled to initiate EXRT yet.  ? ?04/21/21: Mr. DTheardreturns to discuss adjuvant ADT in preparation for EXRT. He is scheduled for a PET scan next week to complete his staging evaluation. 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 He had an MRI fusion biopsy done for a PSA of 13.5 and a 1.9cm left prostate ROI on MRI with findings suggestive of extracapsular disease.  ? ?Prostate volume: 538m  ? ?Path: 3/3 + cores, 2 with GG4 and 1 with GG3, in the left ROI.  ?5/6 + cores on the left with GG4 and GG3.  ?1/6 + core on the right with GG1 low volume at the [place].  ? ?Bone scan negative.  ? ?CT AP negative.  ? ?Stage : T1c N0 M0 (MRI suggests T3)  ? ?[facility] nomogram: 24% OCD, 73% ECE, 23% LNI and 22% SVI. 37% 5y61yrS, 24% 10y45yr and 79% 15 yr PCSS.  ? ?IPSS: 3  ? ?SHIM: 10  ? ?  ?ALLERGIES: Codeine Derivatives ?Prednisone ?Vicodin TABS ?  ? ?MEDICATIONS: B-Complex 400 mcg tablet Oral  ?Cetirizine Hcl 10 mg tablet Oral  ?Clonazepam 2 mg tablet Oral  ?Diazepam 10 mg tablet 1-2 tablet PO Daily Take one hour prior to procedure.  ?Levofloxacin 750 mg tablet Take 1 tab po one hour prior to procedure.  ?Losartan-Hydrochlorothiazide 100 mg-12.5 mg tablet  ?Naproxen  ?Pantoprazole Sodium 40 mg tablet, delayed release Oral  ?Trazodone Hcl 100 mg tablet Oral  ?Turmeric Curcumin Complex  ?  ? ?GU PSH: Cystoscopy Insert Stent - 2014, 2012 ?Locm 300-'399Mg'$ /Ml Iodine,1Ml - 03/06/2021 ?Prostate Needle Biopsy - 02/05/2021, 2012 ?Ureteroscopic stone removal - 2014, 2012 ?Varicocele repair, Left - about 1982 ?Vasectomy - about 1977 ? ?  ?   ?PSH Notes: Cystoscopy With Ureteroscopy With Removal Of Calculus, Cystoscopy With Insertion Of Ureteral Stent Right,  Biopsy Of The Prostate Needle, Cystoscopy With Ureteroscopy With Removal Of Calculus, Cystoscopy With Insertion Of Ureteral Stent Left, Knee Surgery, Wrist Surgery, Foot Surgery, Foot Surgery, Shoulder Surgery  ? ?NON-GU PSH: Surgical Pathology, Gross And Microscopic Examination For Prostate Needle - 02/05/2021 ? ?  ? ?GU PMH: Elevated PSA - 04/21/2021, - 02/05/2021, His last PSA was up from the prior levels. His exam is benign. I will repeat a PSA today. F/U in 1 year with a PSA. , - 12/30/2020, Elevated prostate specific antigen (PSA), - 2016 ?Prostate Cancer, He has decided on EXRT for treatment of his high risk prostate cancer. He will need ADT for 18 months and will need SpaceOAR and fiducials as well. I will have him return a few days after the PET scan next week for firmagon and reviewed the risks and side effects in detail. I will get him set up for fiducials and SpaceOAR in coordination with Dr. MannTammi Klippelreviewed the risks of bleeding, infection, rectal erosion and implant migration, thrombotic events and anesthetic risks. He will return 4-6 weeks after the initial firmagon for Eligard '45mg'$  IM. I will get a PSA and testosterone prior to that. - 04/21/2021, - 04/03/2021, - 04/01/2021 (Stable), He has clinical T1c N0 MO GG4 high risk prostate cancer with  possible ECE on MRI. He is not a good candidate for AS and I don't recommend cryo or HIFU. I discussed RALP and the risks in detail and since he is high risk for needing second line therapy consideration of participation in the Proteus trial if that is still enrolling and he meets the criteria and EXRT w/wo a seed boost and with LT ADT. I reviewed the risks and side effects of ADT in detail. I reviewed the risks of the radiation options in detail. I reviewed the risks of SpaceOAR in detail. He was given supporting documents to review. He had questions about Proton beam but seems to be leaning toward surgery. I will get him set up to see Dr. Alinda Money who he requested  and also Dr. Tammi Klippel so as to expedite his care. He may be able to be seen in the [facility] as well. , - 03/12/2021, - 03/06/2021 ?BPH w/o LUTS - 04/03/2021, He has minimal LUTS. , - 12/30/2020, Benign prostatic hypertrophy without lower urinary tract symptoms, - 2016 ?ED due to arterial insufficiency, His SHIM is 10 and I reviewed the impact of prostate cancer therapy on sexual function. If he has surgical therapy, he will likely need a left sided wide resection. - 03/12/2021 ?History of urolithiasis, He has passed a couple of stones but has no recent pain or hematuria. - 12/30/2020, - 2018 ?Right testicular pain, He has chronic right testicular pain post ejaculation. I discussed cord blocks and epididymectomy but he is not interested at this time. - 12/30/2020 ?Renal calculus, Nephrolithiasis - 2015 ?Urinary Tract Inf, Unspec site, Pyuria - 2015 ?Dysuria, Dysuria - 2014 ?Flank Pain, Diffuse Abdominal Pain - 2014 ?Personal Hx Oth male genital organs diseases, History of epididymitis - 2014 ?Ureteral calculus, Mid Ureteral Stone On The Left - 2014, Proximal Ureteral Stone On The Left, - 2014, Proximal Ureteral Stone On The Right, - 2014 ?  ?   ?PMH Notes:  ?2010-08-04 09:48:14 - Note: Venous Thrombosis Of The Deep Vessels Of The Lower Extremity  ? ?NON-GU PMH: Other lack of coordination - 04/03/2021 ?Encounter for general adult medical examination without abnormal findings, Encounter for preventive health examination - 2016 ?Personal history of other diseases of the circulatory system, History of hypertension - 2014 ?Personal history of other diseases of the nervous system and sense organs, History of sleep apnea - 2014 ?Personal history of other specified conditions, History of fatigue - 2014, History of heartburn, - 2014 ?Hypertension ?  ? ?FAMILY HISTORY: Arthritis - Mother ?Bladder Cancer - Mother ?Blood In Urine - Runs In Family ?Family Health Status Number - Runs In Family ?Heart Disease - Brother ?Kidney Stones - Runs in  Family ?Lung Cancer - Mother ?nephrolithiasis - Brother ?renal failure - Brother  ? ?SOCIAL HISTORY: Marital Status: Married ?Preferred Language: [place]; Ethnicity: Not Hispanic Or Latino; Race: White ?Current Smoking Status: Patient has never smoked.  ? ?Tobacco Use Assessment Completed: Used Tobacco in last 30 days? ?Does not use smokeless tobacco. ?Drinks 2 caffeinated drinks per day. ?Has not had a blood transfusion. ?  ?  Notes: Occupation:, Never A Smoker, Tobacco Use, Caffeine Use, Alcohol Use, Marital History - Currently Married  ? ?VITAL SIGNS: None  ? ?Complexity of Data:  ?Records Review:   Previous Doctor Records, Previous Patient Records  ?X-Ray Review: PET Scan: Reviewed Films. Reviewed Report. Discussed With Patient.  ?  ? 12/30/20 02/25/17 06/09/14 03/01/12 08/31/11 02/09/11 12/01/10 10/28/10  ?PSA  ?Total PSA 13.50 ng/mL 9.13 ng/mL 4.58  3.87  3.98  5.40  5.66  6.79   ?Free PSA  1.52 ng/mL 0.83    0.62  0.62  0.79   ?% Free PSA  17 % PSA '18    11  11  12   '$ ? ? ?PROCEDURES:    ? ?     [place] '240mg'$  - [id number], [phone] ?Firmagon 120 mg given rt abdomen and 120 mg given Lt abdomen for total of 240 mg. Dr. Jeffie Pollock talked to him about PET scan, etc. BS  ? ?Qty: 240 Adm. By: Robynn Pane  ?Unit: mg Lot No [id number]  ?Route: SQ Exp. Date 07/01/2023  ?Freq: None Mfgr.:   ?Site: Rt and left abdomen  ? ?ASSESSMENT:  ?    ICD-10 Details  ?1 GU:   Prostate Cancer - C61 Chronic, Life Threatening - He has high risk prostate cancer that will be treated with ADT and EXRT. I reviewed the risks of Space OAR and fiducials and he will return in a month with a PSA and T for an Eligard '45mg'$  injection.   ?2   Renal calculus - N20.0 Chronic, Worsening - He has 2 left renal stones that I will monitor closely. If they enlarge, he may need ESWL.   ?3 NON-GU:   Nontoxic single thyroid nodule - E04.1 Undiagnosed New Problem - Thyroid US ordered. I will refer to Dr. Harlow Asa if there is a concern.   ?  ? ?PLAN:    ? ?       Schedule ?Labs: 1 Month - Total Testosterone  ?  1 Month - PSA  ?Return Visit/Planned Activity: 1 Month - Nurse Visit  ?           Note: eligard  ? ? ?      Document ?Letter(s):  Created for Patient: Clinical Summ

## 2021-06-12 NOTE — Anesthesia Preprocedure Evaluation (Addendum)
Anesthesia Evaluation  ?Patient identified by MRN, date of birth, ID band ?Patient awake ? ? ? ?Reviewed: ?Allergy & Precautions, NPO status , Patient's Chart, lab work & pertinent test results, reviewed documented beta blocker date and time  ? ?History of Anesthesia Complications ?(+) PONV and history of anesthetic complications ? ?Airway ?Mallampati: II ? ?TM Distance: >3 FB ?Neck ROM: Full ? ? ? Dental ?no notable dental hx. ?(+) Dental Advisory Given ?  ?Pulmonary ?shortness of breath and with exertion, sleep apnea ,  ?  ?Pulmonary exam normal ?breath sounds clear to auscultation ? ? ? ? ? ? Cardiovascular ?hypertension, Pt. on medications ?Normal cardiovascular exam ?Rhythm:Regular Rate:Normal ? ?Hx/o Cardiac arrest 02/06/2018 ?  ?Neuro/Psych ?negative neurological ROS ? negative psych ROS  ? GI/Hepatic ?Neg liver ROS, GERD  Medicated,  ?Endo/Other  ?Goiter ? ? Renal/GU ?Renal diseaseHx/o renal calculi  ? ?Prostate Ca ?negative genitourinary ?  ?Musculoskeletal ? ?(+) Arthritis , Osteoarthritis,  Chronic LBP  ? Abdominal ?(+) + obese,   ?Peds ? Hematology ?negative hematology ROS ?(+)   ?Anesthesia Other Findings ? ? Reproductive/Obstetrics ? ?  ? ? ? ? ? ? ? ? ? ? ? ? ? ?  ?  ? ? ? ? ? ? ? ?Anesthesia Physical ?Anesthesia Plan ? ?ASA: 2 ? ?Anesthesia Plan: General  ? ?Post-op Pain Management: Minimal or no pain anticipated  ? ?Induction: Intravenous ? ?PONV Risk Score and Plan: 4 or greater and Treatment may vary due to age or medical condition and Ondansetron ? ?Airway Management Planned: LMA ? ?Additional Equipment: None ? ?Intra-op Plan:  ? ?Post-operative Plan: Extubation in OR ? ?Informed Consent: I have reviewed the patients History and Physical, chart, labs and discussed the procedure including the risks, benefits and alternatives for the proposed anesthesia with the patient or authorized representative who has indicated his/her understanding and acceptance.   ? ? ? ?Dental advisory given ? ?Plan Discussed with: CRNA and Anesthesiologist ? ?Anesthesia Plan Comments:   ? ? ? ? ? ?Anesthesia Quick Evaluation ? ?

## 2021-06-13 ENCOUNTER — Encounter (HOSPITAL_BASED_OUTPATIENT_CLINIC_OR_DEPARTMENT_OTHER): Payer: Self-pay | Admitting: Urology

## 2021-06-13 ENCOUNTER — Ambulatory Visit (HOSPITAL_BASED_OUTPATIENT_CLINIC_OR_DEPARTMENT_OTHER): Payer: Medicare HMO | Admitting: Anesthesiology

## 2021-06-13 ENCOUNTER — Other Ambulatory Visit: Payer: Self-pay

## 2021-06-13 ENCOUNTER — Encounter (HOSPITAL_BASED_OUTPATIENT_CLINIC_OR_DEPARTMENT_OTHER)
Admission: RE | Disposition: A | Discharge status: Home / Self Care | Payer: Self-pay | Source: Home / Self Care | Attending: Urology

## 2021-06-13 ENCOUNTER — Ambulatory Visit (HOSPITAL_BASED_OUTPATIENT_CLINIC_OR_DEPARTMENT_OTHER)
Admission: RE | Admit: 2021-06-13 | Discharge: 2021-06-13 | Disposition: A | Discharge status: Home / Self Care | Payer: Medicare HMO | Attending: Urology | Admitting: Urology

## 2021-06-13 DIAGNOSIS — Z79899 Other long term (current) drug therapy: Secondary | ICD-10-CM | POA: Diagnosis not present

## 2021-06-13 DIAGNOSIS — E041 Nontoxic single thyroid nodule: Secondary | ICD-10-CM | POA: Insufficient documentation

## 2021-06-13 DIAGNOSIS — C61 Malignant neoplasm of prostate: Secondary | ICD-10-CM

## 2021-06-13 DIAGNOSIS — I10 Essential (primary) hypertension: Secondary | ICD-10-CM | POA: Insufficient documentation

## 2021-06-13 DIAGNOSIS — N200 Calculus of kidney: Secondary | ICD-10-CM | POA: Diagnosis not present

## 2021-06-13 DIAGNOSIS — K219 Gastro-esophageal reflux disease without esophagitis: Secondary | ICD-10-CM | POA: Diagnosis not present

## 2021-06-13 HISTORY — DX: Personal history of other diseases of the nervous system and sense organs: Z86.69

## 2021-06-13 HISTORY — DX: Personal history of urinary calculi: Z87.442

## 2021-06-13 HISTORY — DX: Presence of spectacles and contact lenses: Z97.3

## 2021-06-13 HISTORY — DX: Nontoxic goiter, unspecified: E04.9

## 2021-06-13 HISTORY — PX: SPACE OAR INSTILLATION: SHX6769

## 2021-06-13 HISTORY — PX: GOLD SEED IMPLANT: SHX6343

## 2021-06-13 HISTORY — DX: Chronic kidney disease, stage 2 (mild): N18.2

## 2021-06-13 LAB — POCT I-STAT, CHEM 8
BUN: 18 mg/dL (ref 8–23)
Calcium, Ion: 1.25 mmol/L (ref 1.15–1.40)
Chloride: 104 mmol/L (ref 98–111)
Creatinine, Ser: 1.1 mg/dL (ref 0.61–1.24)
Glucose, Bld: 106 mg/dL — ABNORMAL HIGH (ref 70–99)
HCT: 39 % (ref 39.0–52.0)
Hemoglobin: 13.3 g/dL (ref 13.0–17.0)
Potassium: 3.6 mmol/L (ref 3.5–5.1)
Sodium: 141 mmol/L (ref 135–145)
TCO2: 25 mmol/L (ref 22–32)

## 2021-06-13 SURGERY — INSERTION, GOLD SEEDS
Anesthesia: General | Site: Prostate

## 2021-06-13 MED ORDER — ACETAMINOPHEN 325 MG RE SUPP: 650.0000 mg | RECTAL | Status: DC | PRN

## 2021-06-13 MED ORDER — ONDANSETRON HCL 4 MG/2ML IJ SOLN
INTRAMUSCULAR | Status: DC | PRN
  Administered 2021-06-13: 4 mg via INTRAVENOUS

## 2021-06-13 MED ORDER — LIDOCAINE HCL (PF) 2 % IJ SOLN
INTRAMUSCULAR | Status: AC
  Filled 2021-06-13: qty 5

## 2021-06-13 MED ORDER — CEFAZOLIN SODIUM-DEXTROSE 2-4 GM/100ML-% IV SOLN
INTRAVENOUS | Status: AC
  Filled 2021-06-13: qty 100

## 2021-06-13 MED ORDER — PROPOFOL 10 MG/ML IV BOLUS
INTRAVENOUS | Status: DC | PRN
  Administered 2021-06-13: 180 mg via INTRAVENOUS

## 2021-06-13 MED ORDER — OXYCODONE HCL 5 MG/5ML PO SOLN: 5.0000 mg | Freq: Once | ORAL | Status: DC | PRN

## 2021-06-13 MED ORDER — SODIUM CHLORIDE 0.9 % IV SOLN: 250.0000 mL | INTRAVENOUS | Status: DC | PRN

## 2021-06-13 MED ORDER — MIDAZOLAM HCL 2 MG/2ML IJ SOLN
INTRAMUSCULAR | Status: AC
  Filled 2021-06-13: qty 2

## 2021-06-13 MED ORDER — OXYCODONE HCL 5 MG PO TABS: 5.0000 mg | ORAL_TABLET | Freq: Once | ORAL | Status: DC | PRN

## 2021-06-13 MED ORDER — SODIUM CHLORIDE 0.9% FLUSH: 3.0000 mL | Freq: Two times a day (BID) | INTRAVENOUS | Status: DC

## 2021-06-13 MED ORDER — PROPOFOL 10 MG/ML IV BOLUS
INTRAVENOUS | Status: AC
Start: 2021-06-13 — End: ?
  Filled 2021-06-13: qty 20

## 2021-06-13 MED ORDER — FENTANYL CITRATE (PF) 100 MCG/2ML IJ SOLN
INTRAMUSCULAR | Status: DC | PRN
  Administered 2021-06-13: 50 ug via INTRAVENOUS
  Administered 2021-06-13: 25 ug via INTRAVENOUS

## 2021-06-13 MED ORDER — PROPOFOL 500 MG/50ML IV EMUL
INTRAVENOUS | Status: AC
  Filled 2021-06-13: qty 50

## 2021-06-13 MED ORDER — MIDAZOLAM HCL 5 MG/5ML IJ SOLN
INTRAMUSCULAR | Status: DC | PRN
  Administered 2021-06-13: 2 mg via INTRAVENOUS

## 2021-06-13 MED ORDER — FENTANYL CITRATE (PF) 100 MCG/2ML IJ SOLN
INTRAMUSCULAR | Status: AC
Start: 2021-06-13 — End: ?
  Filled 2021-06-13: qty 2

## 2021-06-13 MED ORDER — SODIUM CHLORIDE 0.9% FLUSH: 3.0000 mL | INTRAVENOUS | Status: DC | PRN

## 2021-06-13 MED ORDER — LIDOCAINE HCL 2 % IJ SOLN
INTRAMUSCULAR | Status: DC | PRN
  Administered 2021-06-13: 7 mL

## 2021-06-13 MED ORDER — ONDANSETRON HCL 4 MG/2ML IJ SOLN
INTRAMUSCULAR | Status: AC
  Filled 2021-06-13: qty 2

## 2021-06-13 MED ORDER — FLEET ENEMA 7-19 GM/118ML RE ENEM: 1.0000 | ENEMA | Freq: Once | RECTAL | Status: DC

## 2021-06-13 MED ORDER — HYDROMORPHONE HCL 1 MG/ML IJ SOLN: 0.2500 mg | INTRAMUSCULAR | Status: DC | PRN

## 2021-06-13 MED ORDER — LIDOCAINE 2% (20 MG/ML) 5 ML SYRINGE
INTRAMUSCULAR | Status: DC | PRN
  Administered 2021-06-13: 80 mg via INTRAVENOUS

## 2021-06-13 MED ORDER — SODIUM CHLORIDE (PF) 0.9 % IJ SOLN
INTRAMUSCULAR | Status: DC | PRN
  Administered 2021-06-13: 10 mL

## 2021-06-13 MED ORDER — DEXAMETHASONE SODIUM PHOSPHATE 10 MG/ML IJ SOLN
INTRAMUSCULAR | Status: DC | PRN
  Administered 2021-06-13: 5 mg via INTRAVENOUS

## 2021-06-13 MED ORDER — LACTATED RINGERS IV SOLN: INTRAVENOUS | Status: DC

## 2021-06-13 MED ORDER — OXYCODONE HCL 5 MG PO TABS: 5.0000 mg | ORAL_TABLET | ORAL | Status: DC | PRN

## 2021-06-13 MED ORDER — CEFAZOLIN SODIUM-DEXTROSE 2-4 GM/100ML-% IV SOLN
2.0000 g | INTRAVENOUS | Status: AC
  Administered 2021-06-13: 2 g via INTRAVENOUS

## 2021-06-13 MED ORDER — ACETAMINOPHEN 325 MG PO TABS: 650.0000 mg | ORAL_TABLET | ORAL | Status: DC | PRN

## 2021-06-13 MED ORDER — ONDANSETRON HCL 4 MG/2ML IJ SOLN: 4.0000 mg | Freq: Once | INTRAMUSCULAR | Status: DC | PRN

## 2021-06-13 MED ORDER — MORPHINE SULFATE (PF) 4 MG/ML IV SOLN: 2.0000 mg | INTRAVENOUS | Status: DC | PRN

## 2021-06-13 SURGICAL SUPPLY — 27 items
BLADE CLIPPER SENSICLIP SURGIC (BLADE) ×4 IMPLANT
CNTNR URN SCR LID CUP LEK RST (MISCELLANEOUS) ×3 IMPLANT
CONT SPEC 4OZ STRL OR WHT (MISCELLANEOUS) ×3
COVER BACK TABLE 60X90IN (DRAPES) ×4 IMPLANT
DRSG IV TEGADERM 3.5X4.5 STRL (GAUZE/BANDAGES/DRESSINGS) ×1 IMPLANT
DRSG TEGADERM 4X4.75 (GAUZE/BANDAGES/DRESSINGS) ×4 IMPLANT
DRSG TEGADERM 8X12 (GAUZE/BANDAGES/DRESSINGS) ×4 IMPLANT
GAUZE SPONGE 4X4 12PLY STRL (GAUZE/BANDAGES/DRESSINGS) ×4 IMPLANT
GAUZE SPONGE 4X4 8PLY NS (GAUZE/BANDAGES/DRESSINGS) ×1 IMPLANT
GLOVE ECLIPSE 8.0 STRL XLNG CF (GLOVE) ×4 IMPLANT
GLOVE SURG ORTHO 8.5 STRL (GLOVE) ×4 IMPLANT
GLOVE SURG SS PI 8.0 STRL IVOR (GLOVE) ×4 IMPLANT
IMPL SPACEOAR VUE SYSTEM (Spacer) ×3 IMPLANT
IMPLANT SPACEOAR VUE SYSTEM (Spacer) ×3 IMPLANT
KIT TURNOVER CYSTO (KITS) ×4 IMPLANT
MARKER GOLD PRELOAD 1.2X3 (Urological Implant) ×3 IMPLANT
MARKER SKIN DUAL TIP RULER LAB (MISCELLANEOUS) ×4 IMPLANT
NDL SPNL 22GX7 QUINCKE BK (NEEDLE) ×3 IMPLANT
NEEDLE SPNL 22GX7 QUINCKE BK (NEEDLE) ×3 IMPLANT
SEED GOLD PRELOAD 1.2X3 (Urological Implant) ×3 IMPLANT
SHEATH ULTRASOUND LF (SHEATH) IMPLANT
SHEATH ULTRASOUND LTX NONSTRL (SHEATH) ×1 IMPLANT
SURGILUBE 2OZ TUBE FLIPTOP (MISCELLANEOUS) ×4 IMPLANT
SYR 10ML LL (SYRINGE) ×1 IMPLANT
SYR CONTROL 10ML LL (SYRINGE) ×4 IMPLANT
TOWEL OR 17X26 10 PK STRL BLUE (TOWEL DISPOSABLE) ×4 IMPLANT
UNDERPAD 30X36 HEAVY ABSORB (UNDERPADS AND DIAPERS) ×4 IMPLANT

## 2021-06-13 NOTE — Transfer of Care (Signed)
Immediate Anesthesia Transfer of Care Note ? ?Patient: JOHNN KRASOWSKI ? ?Procedure(s) Performed: GOLD SEED IMPLANT (Prostate) ?SPACE OAR INSTILLATION (Perineum) ? ?Patient Location: PACU ? ?Anesthesia Type:General ? ?Level of Consciousness: awake, alert , oriented and patient cooperative ? ?Airway & Oxygen Therapy: Patient Spontanous Breathing ? ?Post-op Assessment: Report given to RN and Post -op Vital signs reviewed and stable ? ?Post vital signs: Reviewed and stable ? ?Last Vitals:  ?Vitals Value Taken Time  ?BP 129/77 06/13/21 0806  ?Temp 36.3 ?C 06/13/21 0806  ?Pulse 67 06/13/21 0809  ?Resp 14 06/13/21 0809  ?SpO2 94 % 06/13/21 0809  ?Vitals shown include unvalidated device data. ? ?Last Pain:  ?Vitals:  ? 06/13/21 0624  ?TempSrc: Oral  ?PainSc: 3   ?   ? ?Patients Stated Pain Goal: 3 (06/13/21 8416) ? ?Complications: No notable events documented. ?

## 2021-06-13 NOTE — Interval H&P Note (Signed)
History and Physical Interval Note: ? ?06/13/2021 ?7:21 AM ? ?Casey [name]  has presented today for surgery, with the diagnosis of PROSTATE CANCER.  The various methods of treatment have been discussed with the patient and family. After consideration of risks, benefits and other options for treatment, the patient has consented to  Procedure(s) with comments: ?GOLD SEED IMPLANT (N/A) - 30 MINS FOR THIS CASE ?SPACE OAR INSTILLATION (N/A) as a surgical intervention.  The patient's history has been reviewed, patient examined, no change in status, stable for surgery.  I have reviewed the patient's chart and labs.  Questions were answered to the patient's satisfaction.   ? ? ?Irine Seal ? ? ?

## 2021-06-13 NOTE — Anesthesia Postprocedure Evaluation (Signed)
Anesthesia Post Note ? ?Patient: Casey [name] ? ?Procedure(s) Performed: GOLD SEED IMPLANT (Prostate) ?SPACE OAR INSTILLATION (Perineum) ? ?  ? ?Patient location during evaluation: PACU ?Anesthesia Type: General ?Level of consciousness: awake and alert and oriented ?Pain management: pain level controlled ?Vital Signs Assessment: post-procedure vital signs reviewed and stable ?Respiratory status: spontaneous breathing, nonlabored ventilation and respiratory function stable ?Cardiovascular status: blood pressure returned to baseline and stable ?Postop Assessment: no apparent nausea or vomiting ?Anesthetic complications: no ? ? ?No notable events documented. ? ?Last Vitals:  ?Vitals:  ? 06/13/21 0830 06/13/21 0841  ?BP: 140/81 (!) 149/88  ?Pulse: (!) 56 (!) 54  ?Resp: (!) 9 10  ?Temp:  (!) 36.3 ?C  ?SpO2: 96% 97%  ?  ?Last Pain:  ?Vitals:  ? 06/13/21 0841  ?TempSrc:   ?PainSc: 4   ? ? ?  ?  ?  ?  ?  ?  ? ?Ezrie Bunyan A. ? ? ? ? ?

## 2021-06-13 NOTE — Op Note (Signed)
Procedure: 1.  Ultrasound-guided placement of gold fiducial markers. ?2.  Ultrasound-guided placement of SpaceOAR. ? ?Preop diagnosis: Prostate cancer. ? ?Postop diagnosis: Same. ? ?Surgeon: Dr. Irine Seal. ? ?Anesthesia: MAC and local. ? ?Drain: None. ? ?Specimen: None. ? ?EBL: None. ? ?Complications: None. ? ?Indications: The patient is a 66 year old male with prostate cancer who is to undergo placement of gold fiducial markers and SpaceOAR hydrogel in preparation for external beam radiation therapy. ? ?Procedure: He was given antibiotics.  He was taken operating room was placed in lithotomy position and sedation was given as needed.  His his genitals were secured anteriorly with a OpSite and towel.  His perineum was clipped.  He was prepped with Betadine solution. ? ?The B&K ultrasound probe was assembled and inserted and secured to the fixed base.  The prostate was positioned appropriately for imaging of both the sagittal and transverse planes. ? ?10 mL of 2% lidocaine was then injected using a spinal needle into the perineal skin and along the tract to the prostatic apex. ? ?The gold fiducial markers were then placed under ultrasound guidance.  Markers were placed at the right medial base, the right medial apex and the left mid lateral prostate in the standard configuration. ? ?The SpaceOAR needle was then passed in the midline at an angle into the post prostatic fat stripe and saline was then injected to confirm positioning and hydrodissect the space. ? ?The SpaceOAR polymer was prepared and then injected over approximately 12 seconds into the space with good distribution.  The ultrasound probe was removed.  The perineum was cleansed.  He was taken down from lithotomy position, his anesthetic was reversed and he was moved to recovery room in stable condition.  There were no complications. ?

## 2021-06-13 NOTE — Anesthesia Procedure Notes (Signed)
Procedure Name: LMA Insertion ?Date/Time: 06/13/2021 7:34 AM ?Performed by: Rogers Blocker, CRNA ?Pre-anesthesia Checklist: Patient identified, Emergency Drugs available, Suction available and Patient being monitored ?Patient Re-evaluated:Patient Re-evaluated prior to induction ?Oxygen Delivery Method: Circle System Utilized ?Preoxygenation: Pre-oxygenation with 100% oxygen ?Induction Type: IV induction ?Ventilation: Mask ventilation without difficulty ?LMA: LMA inserted ?LMA Size: 5.0 ?Number of attempts: 1 ?Placement Confirmation: positive ETCO2 ?Tube secured with: Tape ?Dental Injury: Teeth and Oropharynx as per pre-operative assessment  ? ? ? ? ?

## 2021-06-13 NOTE — Discharge Instructions (Signed)

## 2021-06-15 DIAGNOSIS — Z191 Hormone sensitive malignancy status: Secondary | ICD-10-CM | POA: Diagnosis not present

## 2021-06-15 DIAGNOSIS — C61 Malignant neoplasm of prostate: Secondary | ICD-10-CM | POA: Diagnosis not present

## 2021-06-15 NOTE — Progress Notes (Signed)
?  Radiation Oncology         [phone] ?________________________________ ? ?Name: Casey [name] MRN: [id number]  ?Date: 06/19/2021  DOB: August 02, 1955 ? ?SIMULATION AND TREATMENT PLANNING NOTE ? ?  ICD-10-CM   ?1. Prostate cancer ([place])  C61   ?  ? ? ?DIAGNOSIS:  66 y.o. gentleman with Stage T1c adenocarcinoma of the prostate with Gleason score of 4+4, and PSA of 13.5. ? ?NARRATIVE:  The patient was brought to the [place].  Identity was confirmed.  All relevant records and images related to the planned course of therapy were reviewed.  The patient freely provided informed written consent to proceed with treatment after reviewing the details related to the planned course of therapy. The consent form was witnessed and verified by the simulation staff.  Then, the patient was set-up in a stable reproducible supine position for radiation therapy.  A vacuum lock pillow device was custom fabricated to position his legs in a reproducible immobilized position.  Then, I performed a urethrogram under sterile conditions to identify the prostatic bed.  CT images were obtained.  Surface markings were placed.  The CT images were loaded into the planning software.  Then the prostate bed target, pelvic lymph node target and avoidance structures including the rectum, bladder, bowel and hips were contoured.  Treatment planning then occurred.  The radiation prescription was entered and confirmed.  A total of one complex treatment devices were fabricated. I have requested : Intensity Modulated Radiotherapy (IMRT) is medically necessary for this case for the following reason:  Rectal sparing.. ? ?PLAN:  The patient will receive 45 Gy in 25 fractions of 1.8 Gy, followed by a boost to the prostate to a total dose of 75 Gy with 15 additional fractions of 2 Gy. ? ? ?________________________________ ? ?Sheral Apley Tammi Klippel, M.D. ? ?

## 2021-06-16 ENCOUNTER — Encounter (HOSPITAL_BASED_OUTPATIENT_CLINIC_OR_DEPARTMENT_OTHER): Payer: Self-pay | Admitting: Urology

## 2021-06-18 ENCOUNTER — Telehealth: Payer: Self-pay | Admitting: *Deleted

## 2021-06-18 NOTE — Telephone Encounter (Signed)
CALLED PATIENT TO REMIND OF SIM APPT. FOR 06-19-21- ARRIVAL TIME- 8:45 AM @ [facility], INFORMED PATIENT TO ARRIVE WITH A FULL BLADDER AND AN EMPTY BOWEL, SPOKE WITH PATIENT AND HE IS AWARE OF THIS APPT. ?

## 2021-06-19 ENCOUNTER — Ambulatory Visit
Admission: RE | Admit: 2021-06-19 | Discharge: 2021-06-19 | Disposition: A | Discharge status: Home / Self Care | Payer: Medicare HMO | Source: Ambulatory Visit | Attending: Radiation Oncology | Admitting: Radiation Oncology

## 2021-06-19 ENCOUNTER — Other Ambulatory Visit: Payer: Self-pay

## 2021-06-19 DIAGNOSIS — C61 Malignant neoplasm of prostate: Secondary | ICD-10-CM | POA: Insufficient documentation

## 2021-06-19 DIAGNOSIS — Z191 Hormone sensitive malignancy status: Secondary | ICD-10-CM | POA: Diagnosis not present

## 2021-06-25 ENCOUNTER — Ambulatory Visit (INDEPENDENT_AMBULATORY_CARE_PROVIDER_SITE_OTHER): Payer: Medicare HMO | Admitting: Family Medicine

## 2021-06-25 VITALS — BP 150/84 | HR 54 | Temp 97.7°F | Ht 64.5 in | Wt 219.5 lb

## 2021-06-25 DIAGNOSIS — F5101 Primary insomnia: Secondary | ICD-10-CM | POA: Diagnosis not present

## 2021-06-25 DIAGNOSIS — K2100 Gastro-esophageal reflux disease with esophagitis, without bleeding: Secondary | ICD-10-CM

## 2021-06-25 DIAGNOSIS — G8929 Other chronic pain: Secondary | ICD-10-CM | POA: Diagnosis not present

## 2021-06-25 DIAGNOSIS — Z191 Hormone sensitive malignancy status: Secondary | ICD-10-CM | POA: Diagnosis not present

## 2021-06-25 DIAGNOSIS — C61 Malignant neoplasm of prostate: Secondary | ICD-10-CM | POA: Diagnosis not present

## 2021-06-25 DIAGNOSIS — F1320 Sedative, hypnotic or anxiolytic dependence, uncomplicated: Secondary | ICD-10-CM | POA: Diagnosis not present

## 2021-06-25 DIAGNOSIS — I10 Essential (primary) hypertension: Secondary | ICD-10-CM | POA: Diagnosis not present

## 2021-06-25 DIAGNOSIS — M545 Low back pain, unspecified: Secondary | ICD-10-CM | POA: Diagnosis not present

## 2021-06-25 DIAGNOSIS — R69 Illness, unspecified: Secondary | ICD-10-CM | POA: Diagnosis not present

## 2021-06-25 MED ORDER — CLONAZEPAM 1 MG PO TABS: 1.0000 mg | ORAL_TABLET | Freq: Every day | ORAL | 0 refills | Status: DC

## 2021-06-25 MED ORDER — TRAMADOL HCL 50 MG PO TABS: 50.0000 mg | ORAL_TABLET | Freq: Four times a day (QID) | ORAL | 0 refills | Status: DC | PRN

## 2021-06-25 MED ORDER — PANTOPRAZOLE SODIUM 40 MG PO TBEC: 40.0000 mg | DELAYED_RELEASE_TABLET | Freq: Every day | ORAL | 1 refills | Status: DC

## 2021-06-25 MED ORDER — LOSARTAN POTASSIUM 50 MG PO TABS: 50.0000 mg | ORAL_TABLET | Freq: Every day | ORAL | 3 refills | Status: DC

## 2021-06-25 NOTE — Assessment & Plan Note (Signed)
Blood pressure elevated, likely due to stopping HCTZ due to side effects.  Continue losartan 50 mg daily.  Home monitoring and update with blood pressure and 2 weeks.  If still elevated will likely add amlodipine. ?

## 2021-06-25 NOTE — Progress Notes (Signed)
? ?Subjective:  ? ?  ?Casey [name] is a 66 y.o. male presenting for Medication Refill ?  ? ? ?Medication Refill ? ? ?#HTN ?- stopped hctz due to side effects ?- has been taking losartan ?- more back pain recently ?- has not been checking at home ? ?#prostate cancer ?- just had seeds inserted for target for radiation ?- is having urinary frequency ?- did not have surgery ? ?#insomnia ?- is taking hormone shots and not doing well - hot flashes ?- has a sheet and ceiling fan ?- trazodone 200 mg ?- clonazepam 1 mg ?- only getting 3-5 hours of sleep ?- hot sweats and pain are waking him up most often ? ?Review of Systems ? ? ?Social History  ? ?Tobacco Use  ?Smoking Status Never  ?Smokeless Tobacco Never  ? ? ? ?   ?Objective:  ?  ?BP Readings from Last 3 Encounters:  ?06/25/21 (!) 150/84  ?06/13/21 (!) 151/94  ?12/31/20 (!) 147/96  ? ?Wt Readings from Last 3 Encounters:  ?06/25/21 219 lb 8 oz (99.6 kg)  ?06/13/21 216 lb 3.2 oz (98.1 kg)  ?04/08/21 210 lb (95.3 kg)  ? ? ?BP (!) 150/84   Pulse (!) 54   Temp 97.7 ?F (36.5 ?C) (Oral)   Ht 5' 4.5" (1.638 m)   Wt 219 lb 8 oz (99.6 kg)   SpO2 97%   BMI 37.10 kg/m?  ? ? ?Physical Exam ?Constitutional:   ?   Appearance: Normal appearance. He is not ill-appearing or diaphoretic.  ?HENT:  ?   Right Ear: External ear normal.  ?   Left Ear: External ear normal.  ?   Nose: Nose normal.  ?Eyes:  ?   General: No scleral icterus. ?   Extraocular Movements: Extraocular movements intact.  ?   Conjunctiva/sclera: Conjunctivae normal.  ?Cardiovascular:  ?   Rate and Rhythm: Normal rate and regular rhythm.  ?   Heart sounds: No murmur heard. ?Pulmonary:  ?   Effort: Pulmonary effort is normal. No respiratory distress.  ?   Breath sounds: Normal breath sounds. No wheezing.  ?Musculoskeletal:  ?   Cervical back: Neck supple.  ?Skin: ?   General: Skin is warm and dry.  ?Neurological:  ?   Mental Status: He is alert. Mental status is at baseline.  ?Psychiatric:     ?   Mood and Affect:  Mood normal.  ? ? ? ? ? ?   ?Assessment & Plan:  ? ?Problem List Items Addressed This Visit   ? ?  ? Cardiovascular and Mediastinum  ? HTN (hypertension)  ?  Blood pressure elevated, likely due to stopping HCTZ due to side effects.  Continue losartan 50 mg daily.  Home monitoring and update with blood pressure and 2 weeks.  If still elevated will likely add amlodipine. ? ?  ?  ? Relevant Medications  ? losartan (COZAAR) 50 MG tablet  ?  ? Digestive  ? GERD (gastroesophageal reflux disease)  ? Relevant Medications  ? pantoprazole (PROTONIX) 40 MG tablet  ?  ? Genitourinary  ? Prostate cancer ([place])  ?  He is currently on hormone suppression therapy, and anticipating starting radiation soon.  Overall he is doing well however the hot flashes are interrupting sleep see plan.  Appreciate urology and Rad Onc ? ?  ?  ?  ? Other  ? Insomnia  ?  Worse in setting of hot flashes every night.  Continue trazodone 200 mg daily, discussed  increasing clonazepam temporarily up to 2 mg.  He previously been on 4 mg at night but he has been titrating down to 1 mg with some success.  Hormones will be used for approximately 18 months so if effective we will plan to taper once he is no longer on hormones. ? ?  ?  ? Relevant Medications  ? clonazePAM (KLONOPIN) 1 MG tablet  ? Benzodiazepine dependence ([place]) - Primary  ? Relevant Medications  ? clonazePAM (KLONOPIN) 1 MG tablet  ? Low back pain  ? Relevant Medications  ? traMADol (ULTRAM) 50 MG tablet  ? ?Other Visit Diagnoses   ? ? Essential hypertension      ? Relevant Medications  ? losartan (COZAAR) 50 MG tablet  ? ?  ? ? ? ?Return in about 6 months (around 12/25/2021) for medication. ? ?Lesleigh Noe, MD ? ? ? ?

## 2021-06-25 NOTE — Assessment & Plan Note (Signed)
He is currently on hormone suppression therapy, and anticipating starting radiation soon.  Overall he is doing well however the hot flashes are interrupting sleep see plan.  Appreciate urology and Rad Onc ?

## 2021-06-25 NOTE — Assessment & Plan Note (Signed)
Still with intermittent pain patient getting ready to do cancer treatment.  Refill of tramadol 50 mg as needed. ?

## 2021-06-25 NOTE — Patient Instructions (Addendum)
Sleep ?- try taking Clonazepam 1-2 mg as needed for sleep ?- if no change just take 1 mg dose ?- continue trazodone ? ?Your blood pressure high.  ? ?Update via mychart in 2 weeks with home readings ? ?High blood pressure increases your risk for heart attack and stroke.  ? ? ?Please check your blood pressure 2-4 times a week.  ? ?To check your blood pressure ?1) Sit in a quiet and relaxed place for 5 minutes ?2) Make sure your feet are flat on the ground ?3) Consider checking first thing in the morning  ? ?Normal blood pressure is less than 140/90 ?Ideally you blood pressure should be around 120/80 ? ?Other ways you can reduce your blood pressure:  ?1) Regular exercise ?-- Try to get 150 minutes (30 minutes, 5 days a week) of moderate to vigorous aerobic excercise ?-- Examples: brisk walking (2.5 miles per hour), water aerobics, dancing, gardening, tennis, biking slower than 10 miles per hour ?2) DASH Diet - low fat meats, more fresh fruits and vegetables, whole grains, low salt ?3) Quit smoking if you smoke ?4) Loose 5-10% of your body weight ? ? ? ? ? ? ?

## 2021-06-25 NOTE — Assessment & Plan Note (Signed)
Worse in setting of hot flashes every night.  Continue trazodone 200 mg daily, discussed increasing clonazepam temporarily up to 2 mg.  He previously been on 4 mg at night but he has been titrating down to 1 mg with some success.  Hormones will be used for approximately 18 months so if effective we will plan to taper once he is no longer on hormones. ?

## 2021-06-26 ENCOUNTER — Ambulatory Visit: Payer: Medicare HMO | Admitting: Family Medicine

## 2021-07-03 ENCOUNTER — Ambulatory Visit
Admission: RE | Admit: 2021-07-03 | Discharge: 2021-07-03 | Disposition: A | Discharge status: Home / Self Care | Payer: Medicare HMO | Source: Ambulatory Visit | Attending: Radiation Oncology | Admitting: Radiation Oncology

## 2021-07-03 ENCOUNTER — Other Ambulatory Visit: Payer: Self-pay

## 2021-07-03 DIAGNOSIS — Z510 Encounter for antineoplastic radiation therapy: Secondary | ICD-10-CM | POA: Diagnosis not present

## 2021-07-03 DIAGNOSIS — Z191 Hormone sensitive malignancy status: Secondary | ICD-10-CM | POA: Diagnosis not present

## 2021-07-03 DIAGNOSIS — C61 Malignant neoplasm of prostate: Secondary | ICD-10-CM | POA: Diagnosis not present

## 2021-07-03 LAB — RAD ONC ARIA SESSION SUMMARY
Course Elapsed Days: 0
Plan Fractions Treated to Date: 1
Plan Prescribed Dose Per Fraction: 1.8 Gy
Plan Total Fractions Prescribed: 25
Plan Total Prescribed Dose: 45 Gy
Reference Point Dosage Given to Date: 1.8 Gy
Reference Point Session Dosage Given: 1.8 Gy
Session Number: 1

## 2021-07-04 ENCOUNTER — Ambulatory Visit
Admission: RE | Admit: 2021-07-04 | Discharge: 2021-07-04 | Disposition: A | Discharge status: Home / Self Care | Payer: Medicare HMO | Source: Ambulatory Visit | Attending: Radiation Oncology | Admitting: Radiation Oncology

## 2021-07-04 ENCOUNTER — Other Ambulatory Visit: Payer: Self-pay

## 2021-07-04 DIAGNOSIS — Z510 Encounter for antineoplastic radiation therapy: Secondary | ICD-10-CM | POA: Diagnosis not present

## 2021-07-04 DIAGNOSIS — C61 Malignant neoplasm of prostate: Secondary | ICD-10-CM | POA: Diagnosis not present

## 2021-07-04 DIAGNOSIS — Z191 Hormone sensitive malignancy status: Secondary | ICD-10-CM | POA: Diagnosis not present

## 2021-07-04 LAB — RAD ONC ARIA SESSION SUMMARY
Course Elapsed Days: 1
Plan Fractions Treated to Date: 2
Plan Prescribed Dose Per Fraction: 1.8 Gy
Plan Total Fractions Prescribed: 25
Plan Total Prescribed Dose: 45 Gy
Reference Point Dosage Given to Date: 3.6 Gy
Reference Point Session Dosage Given: 1.8 Gy
Session Number: 2

## 2021-07-07 ENCOUNTER — Other Ambulatory Visit: Payer: Self-pay

## 2021-07-07 ENCOUNTER — Ambulatory Visit
Admission: RE | Admit: 2021-07-07 | Discharge: 2021-07-07 | Disposition: A | Discharge status: Home / Self Care | Payer: Medicare HMO | Source: Ambulatory Visit | Attending: Radiation Oncology | Admitting: Radiation Oncology

## 2021-07-07 DIAGNOSIS — Z191 Hormone sensitive malignancy status: Secondary | ICD-10-CM | POA: Diagnosis not present

## 2021-07-07 DIAGNOSIS — Z510 Encounter for antineoplastic radiation therapy: Secondary | ICD-10-CM | POA: Diagnosis not present

## 2021-07-07 DIAGNOSIS — C61 Malignant neoplasm of prostate: Secondary | ICD-10-CM | POA: Diagnosis not present

## 2021-07-07 LAB — RAD ONC ARIA SESSION SUMMARY
Course Elapsed Days: 4
Plan Fractions Treated to Date: 3
Plan Prescribed Dose Per Fraction: 1.8 Gy
Plan Total Fractions Prescribed: 25
Plan Total Prescribed Dose: 45 Gy
Reference Point Dosage Given to Date: 5.4 Gy
Reference Point Session Dosage Given: 1.8 Gy
Session Number: 3

## 2021-07-08 ENCOUNTER — Ambulatory Visit
Admission: RE | Admit: 2021-07-08 | Discharge: 2021-07-08 | Disposition: A | Discharge status: Home / Self Care | Payer: Medicare HMO | Source: Ambulatory Visit | Attending: Radiation Oncology | Admitting: Radiation Oncology

## 2021-07-08 ENCOUNTER — Other Ambulatory Visit: Payer: Self-pay

## 2021-07-08 DIAGNOSIS — C61 Malignant neoplasm of prostate: Secondary | ICD-10-CM | POA: Diagnosis not present

## 2021-07-08 DIAGNOSIS — Z510 Encounter for antineoplastic radiation therapy: Secondary | ICD-10-CM | POA: Diagnosis not present

## 2021-07-08 DIAGNOSIS — Z191 Hormone sensitive malignancy status: Secondary | ICD-10-CM | POA: Diagnosis not present

## 2021-07-08 LAB — RAD ONC ARIA SESSION SUMMARY
Course Elapsed Days: 5
Plan Fractions Treated to Date: 4
Plan Prescribed Dose Per Fraction: 1.8 Gy
Plan Total Fractions Prescribed: 25
Plan Total Prescribed Dose: 45 Gy
Reference Point Dosage Given to Date: 7.2 Gy
Reference Point Session Dosage Given: 1.8 Gy
Session Number: 4

## 2021-07-09 ENCOUNTER — Other Ambulatory Visit: Payer: Self-pay

## 2021-07-09 ENCOUNTER — Ambulatory Visit
Admission: RE | Admit: 2021-07-09 | Discharge: 2021-07-09 | Disposition: A | Discharge status: Home / Self Care | Payer: Medicare HMO | Source: Ambulatory Visit | Attending: Radiation Oncology | Admitting: Radiation Oncology

## 2021-07-09 DIAGNOSIS — E042 Nontoxic multinodular goiter: Secondary | ICD-10-CM | POA: Diagnosis not present

## 2021-07-09 DIAGNOSIS — Z191 Hormone sensitive malignancy status: Secondary | ICD-10-CM | POA: Diagnosis not present

## 2021-07-09 DIAGNOSIS — Z510 Encounter for antineoplastic radiation therapy: Secondary | ICD-10-CM | POA: Diagnosis not present

## 2021-07-09 DIAGNOSIS — C61 Malignant neoplasm of prostate: Secondary | ICD-10-CM | POA: Diagnosis not present

## 2021-07-09 LAB — RAD ONC ARIA SESSION SUMMARY
Course Elapsed Days: 6
Plan Fractions Treated to Date: 5
Plan Prescribed Dose Per Fraction: 1.8 Gy
Plan Total Fractions Prescribed: 25
Plan Total Prescribed Dose: 45 Gy
Reference Point Dosage Given to Date: 9 Gy
Reference Point Session Dosage Given: 1.8 Gy
Session Number: 5

## 2021-07-10 ENCOUNTER — Other Ambulatory Visit: Payer: Self-pay

## 2021-07-10 ENCOUNTER — Ambulatory Visit
Admission: RE | Admit: 2021-07-10 | Discharge: 2021-07-10 | Disposition: A | Discharge status: Home / Self Care | Payer: Medicare HMO | Source: Ambulatory Visit | Attending: Radiation Oncology | Admitting: Radiation Oncology

## 2021-07-10 DIAGNOSIS — Z510 Encounter for antineoplastic radiation therapy: Secondary | ICD-10-CM | POA: Diagnosis not present

## 2021-07-10 DIAGNOSIS — Z191 Hormone sensitive malignancy status: Secondary | ICD-10-CM | POA: Diagnosis not present

## 2021-07-10 DIAGNOSIS — C61 Malignant neoplasm of prostate: Secondary | ICD-10-CM | POA: Diagnosis not present

## 2021-07-10 LAB — RAD ONC ARIA SESSION SUMMARY
Course Elapsed Days: 7
Plan Fractions Treated to Date: 6
Plan Prescribed Dose Per Fraction: 1.8 Gy
Plan Total Fractions Prescribed: 25
Plan Total Prescribed Dose: 45 Gy
Reference Point Dosage Given to Date: 10.8 Gy
Reference Point Session Dosage Given: 1.8 Gy
Session Number: 6

## 2021-07-11 ENCOUNTER — Other Ambulatory Visit: Payer: Self-pay

## 2021-07-11 ENCOUNTER — Ambulatory Visit
Admission: RE | Admit: 2021-07-11 | Discharge: 2021-07-11 | Disposition: A | Discharge status: Home / Self Care | Payer: Medicare HMO | Source: Ambulatory Visit | Attending: Radiation Oncology | Admitting: Radiation Oncology

## 2021-07-11 DIAGNOSIS — C61 Malignant neoplasm of prostate: Secondary | ICD-10-CM | POA: Diagnosis not present

## 2021-07-11 DIAGNOSIS — Z191 Hormone sensitive malignancy status: Secondary | ICD-10-CM | POA: Diagnosis not present

## 2021-07-11 DIAGNOSIS — Z510 Encounter for antineoplastic radiation therapy: Secondary | ICD-10-CM | POA: Diagnosis not present

## 2021-07-11 LAB — RAD ONC ARIA SESSION SUMMARY
Course Elapsed Days: 8
Plan Fractions Treated to Date: 7
Plan Prescribed Dose Per Fraction: 1.8 Gy
Plan Total Fractions Prescribed: 25
Plan Total Prescribed Dose: 45 Gy
Reference Point Dosage Given to Date: 12.6 Gy
Reference Point Session Dosage Given: 1.8 Gy
Session Number: 7

## 2021-07-14 ENCOUNTER — Other Ambulatory Visit: Payer: Self-pay

## 2021-07-14 ENCOUNTER — Ambulatory Visit
Admission: RE | Admit: 2021-07-14 | Discharge: 2021-07-14 | Disposition: A | Discharge status: Home / Self Care | Payer: Medicare HMO | Source: Ambulatory Visit | Attending: Radiation Oncology | Admitting: Radiation Oncology

## 2021-07-14 DIAGNOSIS — C61 Malignant neoplasm of prostate: Secondary | ICD-10-CM | POA: Diagnosis not present

## 2021-07-14 DIAGNOSIS — Z510 Encounter for antineoplastic radiation therapy: Secondary | ICD-10-CM | POA: Diagnosis not present

## 2021-07-14 DIAGNOSIS — Z191 Hormone sensitive malignancy status: Secondary | ICD-10-CM | POA: Diagnosis not present

## 2021-07-14 LAB — RAD ONC ARIA SESSION SUMMARY
Course Elapsed Days: 11
Plan Fractions Treated to Date: 8
Plan Prescribed Dose Per Fraction: 1.8 Gy
Plan Total Fractions Prescribed: 25
Plan Total Prescribed Dose: 45 Gy
Reference Point Dosage Given to Date: 14.4 Gy
Reference Point Session Dosage Given: 1.8 Gy
Session Number: 8

## 2021-07-15 ENCOUNTER — Ambulatory Visit
Admission: RE | Admit: 2021-07-15 | Discharge: 2021-07-15 | Disposition: A | Discharge status: Home / Self Care | Payer: Medicare HMO | Source: Ambulatory Visit | Attending: Radiation Oncology | Admitting: Radiation Oncology

## 2021-07-15 ENCOUNTER — Other Ambulatory Visit: Payer: Self-pay

## 2021-07-15 DIAGNOSIS — Z191 Hormone sensitive malignancy status: Secondary | ICD-10-CM | POA: Diagnosis not present

## 2021-07-15 DIAGNOSIS — C61 Malignant neoplasm of prostate: Secondary | ICD-10-CM | POA: Diagnosis not present

## 2021-07-15 DIAGNOSIS — Z510 Encounter for antineoplastic radiation therapy: Secondary | ICD-10-CM | POA: Diagnosis not present

## 2021-07-15 LAB — RAD ONC ARIA SESSION SUMMARY
Course Elapsed Days: 12
Plan Fractions Treated to Date: 9
Plan Prescribed Dose Per Fraction: 1.8 Gy
Plan Total Fractions Prescribed: 25
Plan Total Prescribed Dose: 45 Gy
Reference Point Dosage Given to Date: 16.2 Gy
Reference Point Session Dosage Given: 1.8 Gy
Session Number: 9

## 2021-07-16 ENCOUNTER — Other Ambulatory Visit: Payer: Self-pay

## 2021-07-16 ENCOUNTER — Ambulatory Visit
Admission: RE | Admit: 2021-07-16 | Discharge: 2021-07-16 | Disposition: A | Discharge status: Home / Self Care | Payer: Medicare HMO | Source: Ambulatory Visit | Attending: Radiation Oncology | Admitting: Radiation Oncology

## 2021-07-16 DIAGNOSIS — C61 Malignant neoplasm of prostate: Secondary | ICD-10-CM | POA: Diagnosis not present

## 2021-07-16 DIAGNOSIS — Z191 Hormone sensitive malignancy status: Secondary | ICD-10-CM | POA: Diagnosis not present

## 2021-07-16 DIAGNOSIS — Z510 Encounter for antineoplastic radiation therapy: Secondary | ICD-10-CM | POA: Diagnosis not present

## 2021-07-16 LAB — RAD ONC ARIA SESSION SUMMARY
Course Elapsed Days: 13
Plan Fractions Treated to Date: 10
Plan Prescribed Dose Per Fraction: 1.8 Gy
Plan Total Fractions Prescribed: 25
Plan Total Prescribed Dose: 45 Gy
Reference Point Dosage Given to Date: 18 Gy
Reference Point Session Dosage Given: 1.8 Gy
Session Number: 10

## 2021-07-17 ENCOUNTER — Other Ambulatory Visit: Payer: Self-pay

## 2021-07-17 ENCOUNTER — Other Ambulatory Visit: Payer: Self-pay | Admitting: Surgery

## 2021-07-17 ENCOUNTER — Ambulatory Visit
Admission: RE | Admit: 2021-07-17 | Discharge: 2021-07-17 | Disposition: A | Discharge status: Home / Self Care | Payer: Medicare HMO | Source: Ambulatory Visit | Attending: Radiation Oncology | Admitting: Radiation Oncology

## 2021-07-17 DIAGNOSIS — C61 Malignant neoplasm of prostate: Secondary | ICD-10-CM | POA: Diagnosis not present

## 2021-07-17 DIAGNOSIS — E041 Nontoxic single thyroid nodule: Secondary | ICD-10-CM

## 2021-07-17 DIAGNOSIS — Z510 Encounter for antineoplastic radiation therapy: Secondary | ICD-10-CM | POA: Diagnosis not present

## 2021-07-17 DIAGNOSIS — Z191 Hormone sensitive malignancy status: Secondary | ICD-10-CM | POA: Diagnosis not present

## 2021-07-17 LAB — RAD ONC ARIA SESSION SUMMARY
Course Elapsed Days: 14
Plan Fractions Treated to Date: 11
Plan Prescribed Dose Per Fraction: 1.8 Gy
Plan Total Fractions Prescribed: 25
Plan Total Prescribed Dose: 45 Gy
Reference Point Dosage Given to Date: 19.8 Gy
Reference Point Session Dosage Given: 1.8 Gy
Session Number: 11

## 2021-07-18 ENCOUNTER — Other Ambulatory Visit: Payer: Self-pay

## 2021-07-18 ENCOUNTER — Ambulatory Visit
Admission: RE | Admit: 2021-07-18 | Discharge: 2021-07-18 | Disposition: A | Discharge status: Home / Self Care | Payer: Medicare HMO | Source: Ambulatory Visit | Attending: Radiation Oncology | Admitting: Radiation Oncology

## 2021-07-18 DIAGNOSIS — Z191 Hormone sensitive malignancy status: Secondary | ICD-10-CM | POA: Diagnosis not present

## 2021-07-18 DIAGNOSIS — Z510 Encounter for antineoplastic radiation therapy: Secondary | ICD-10-CM | POA: Diagnosis not present

## 2021-07-18 DIAGNOSIS — C61 Malignant neoplasm of prostate: Secondary | ICD-10-CM | POA: Diagnosis not present

## 2021-07-18 LAB — RAD ONC ARIA SESSION SUMMARY
Course Elapsed Days: 15
Plan Fractions Treated to Date: 12
Plan Prescribed Dose Per Fraction: 1.8 Gy
Plan Total Fractions Prescribed: 25
Plan Total Prescribed Dose: 45 Gy
Reference Point Dosage Given to Date: 21.6 Gy
Reference Point Session Dosage Given: 1.8 Gy
Session Number: 12

## 2021-07-21 ENCOUNTER — Other Ambulatory Visit: Payer: Self-pay

## 2021-07-21 ENCOUNTER — Ambulatory Visit
Admission: RE | Admit: 2021-07-21 | Discharge: 2021-07-21 | Disposition: A | Discharge status: Home / Self Care | Payer: Medicare HMO | Source: Ambulatory Visit | Attending: Radiation Oncology | Admitting: Radiation Oncology

## 2021-07-21 DIAGNOSIS — C61 Malignant neoplasm of prostate: Secondary | ICD-10-CM | POA: Diagnosis not present

## 2021-07-21 DIAGNOSIS — Z510 Encounter for antineoplastic radiation therapy: Secondary | ICD-10-CM | POA: Diagnosis not present

## 2021-07-21 DIAGNOSIS — Z191 Hormone sensitive malignancy status: Secondary | ICD-10-CM | POA: Diagnosis not present

## 2021-07-21 LAB — RAD ONC ARIA SESSION SUMMARY
Course Elapsed Days: 18
Plan Fractions Treated to Date: 13
Plan Prescribed Dose Per Fraction: 1.8 Gy
Plan Total Fractions Prescribed: 25
Plan Total Prescribed Dose: 45 Gy
Reference Point Dosage Given to Date: 23.4 Gy
Reference Point Session Dosage Given: 1.8 Gy
Session Number: 13

## 2021-07-22 ENCOUNTER — Ambulatory Visit
Admission: RE | Admit: 2021-07-22 | Discharge: 2021-07-22 | Disposition: A | Discharge status: Home / Self Care | Payer: Medicare HMO | Source: Ambulatory Visit | Attending: Radiation Oncology | Admitting: Radiation Oncology

## 2021-07-22 ENCOUNTER — Other Ambulatory Visit: Payer: Self-pay

## 2021-07-22 DIAGNOSIS — Z191 Hormone sensitive malignancy status: Secondary | ICD-10-CM | POA: Diagnosis not present

## 2021-07-22 DIAGNOSIS — C61 Malignant neoplasm of prostate: Secondary | ICD-10-CM | POA: Diagnosis not present

## 2021-07-22 DIAGNOSIS — Z510 Encounter for antineoplastic radiation therapy: Secondary | ICD-10-CM | POA: Diagnosis not present

## 2021-07-22 LAB — RAD ONC ARIA SESSION SUMMARY
Course Elapsed Days: 19
Plan Fractions Treated to Date: 14
Plan Prescribed Dose Per Fraction: 1.8 Gy
Plan Total Fractions Prescribed: 25
Plan Total Prescribed Dose: 45 Gy
Reference Point Dosage Given to Date: 25.2 Gy
Reference Point Session Dosage Given: 1.8 Gy
Session Number: 14

## 2021-07-23 ENCOUNTER — Other Ambulatory Visit: Payer: Self-pay

## 2021-07-23 ENCOUNTER — Other Ambulatory Visit: Payer: Self-pay | Admitting: Family Medicine

## 2021-07-23 ENCOUNTER — Ambulatory Visit
Admission: RE | Admit: 2021-07-23 | Discharge: 2021-07-23 | Disposition: A | Discharge status: Home / Self Care | Payer: Medicare HMO | Source: Ambulatory Visit | Attending: Radiation Oncology | Admitting: Radiation Oncology

## 2021-07-23 DIAGNOSIS — Z191 Hormone sensitive malignancy status: Secondary | ICD-10-CM | POA: Diagnosis not present

## 2021-07-23 DIAGNOSIS — Z510 Encounter for antineoplastic radiation therapy: Secondary | ICD-10-CM | POA: Diagnosis not present

## 2021-07-23 DIAGNOSIS — C61 Malignant neoplasm of prostate: Secondary | ICD-10-CM | POA: Diagnosis not present

## 2021-07-23 DIAGNOSIS — I10 Essential (primary) hypertension: Secondary | ICD-10-CM

## 2021-07-23 LAB — RAD ONC ARIA SESSION SUMMARY
Course Elapsed Days: 20
Plan Fractions Treated to Date: 15
Plan Prescribed Dose Per Fraction: 1.8 Gy
Plan Total Fractions Prescribed: 25
Plan Total Prescribed Dose: 45 Gy
Reference Point Dosage Given to Date: 27 Gy
Reference Point Session Dosage Given: 1.8 Gy
Session Number: 15

## 2021-07-24 ENCOUNTER — Encounter: Payer: Self-pay | Admitting: Family Medicine

## 2021-07-24 ENCOUNTER — Other Ambulatory Visit: Payer: Self-pay

## 2021-07-24 ENCOUNTER — Ambulatory Visit
Admission: RE | Admit: 2021-07-24 | Discharge: 2021-07-24 | Disposition: A | Discharge status: Home / Self Care | Payer: Medicare HMO | Source: Ambulatory Visit | Attending: Radiation Oncology | Admitting: Radiation Oncology

## 2021-07-24 DIAGNOSIS — C61 Malignant neoplasm of prostate: Secondary | ICD-10-CM | POA: Diagnosis not present

## 2021-07-24 DIAGNOSIS — F1320 Sedative, hypnotic or anxiolytic dependence, uncomplicated: Secondary | ICD-10-CM

## 2021-07-24 DIAGNOSIS — I10 Essential (primary) hypertension: Secondary | ICD-10-CM

## 2021-07-24 DIAGNOSIS — Z191 Hormone sensitive malignancy status: Secondary | ICD-10-CM | POA: Diagnosis not present

## 2021-07-24 DIAGNOSIS — F5101 Primary insomnia: Secondary | ICD-10-CM

## 2021-07-24 DIAGNOSIS — Z510 Encounter for antineoplastic radiation therapy: Secondary | ICD-10-CM | POA: Diagnosis not present

## 2021-07-24 LAB — RAD ONC ARIA SESSION SUMMARY
Course Elapsed Days: 21
Plan Fractions Treated to Date: 16
Plan Prescribed Dose Per Fraction: 1.8 Gy
Plan Total Fractions Prescribed: 25
Plan Total Prescribed Dose: 45 Gy
Reference Point Dosage Given to Date: 28.8 Gy
Reference Point Session Dosage Given: 1.8 Gy
Session Number: 16

## 2021-07-25 ENCOUNTER — Other Ambulatory Visit: Payer: Self-pay

## 2021-07-25 ENCOUNTER — Ambulatory Visit
Admission: RE | Admit: 2021-07-25 | Discharge: 2021-07-25 | Disposition: A | Discharge status: Home / Self Care | Payer: Medicare HMO | Source: Ambulatory Visit | Attending: Radiation Oncology | Admitting: Radiation Oncology

## 2021-07-25 DIAGNOSIS — Z510 Encounter for antineoplastic radiation therapy: Secondary | ICD-10-CM | POA: Diagnosis not present

## 2021-07-25 DIAGNOSIS — C61 Malignant neoplasm of prostate: Secondary | ICD-10-CM | POA: Diagnosis not present

## 2021-07-25 DIAGNOSIS — Z191 Hormone sensitive malignancy status: Secondary | ICD-10-CM | POA: Diagnosis not present

## 2021-07-25 LAB — RAD ONC ARIA SESSION SUMMARY
Course Elapsed Days: 22
Plan Fractions Treated to Date: 17
Plan Prescribed Dose Per Fraction: 1.8 Gy
Plan Total Fractions Prescribed: 25
Plan Total Prescribed Dose: 45 Gy
Reference Point Dosage Given to Date: 30.6 Gy
Reference Point Session Dosage Given: 1.8 Gy
Session Number: 17

## 2021-07-25 MED ORDER — CLONAZEPAM 1 MG PO TABS: 1.0000 mg | ORAL_TABLET | Freq: Every day | ORAL | 0 refills | Status: DC

## 2021-07-25 MED ORDER — AMLODIPINE BESYLATE 5 MG PO TABS: 5.0000 mg | ORAL_TABLET | Freq: Every day | ORAL | 0 refills | Status: DC

## 2021-07-29 ENCOUNTER — Other Ambulatory Visit: Payer: Self-pay

## 2021-07-29 ENCOUNTER — Ambulatory Visit
Admission: RE | Admit: 2021-07-29 | Discharge: 2021-07-29 | Disposition: A | Discharge status: Home / Self Care | Payer: Medicare HMO | Source: Ambulatory Visit | Attending: Radiation Oncology | Admitting: Radiation Oncology

## 2021-07-29 DIAGNOSIS — Z510 Encounter for antineoplastic radiation therapy: Secondary | ICD-10-CM | POA: Diagnosis not present

## 2021-07-29 DIAGNOSIS — Z191 Hormone sensitive malignancy status: Secondary | ICD-10-CM | POA: Diagnosis not present

## 2021-07-29 DIAGNOSIS — C61 Malignant neoplasm of prostate: Secondary | ICD-10-CM | POA: Diagnosis not present

## 2021-07-29 LAB — RAD ONC ARIA SESSION SUMMARY
Course Elapsed Days: 26
Plan Fractions Treated to Date: 18
Plan Prescribed Dose Per Fraction: 1.8 Gy
Plan Total Fractions Prescribed: 25
Plan Total Prescribed Dose: 45 Gy
Reference Point Dosage Given to Date: 32.4 Gy
Reference Point Session Dosage Given: 1.8 Gy
Session Number: 18

## 2021-07-30 ENCOUNTER — Ambulatory Visit
Admission: RE | Admit: 2021-07-30 | Discharge: 2021-07-30 | Disposition: A | Discharge status: Home / Self Care | Payer: Medicare HMO | Source: Ambulatory Visit | Attending: Radiation Oncology | Admitting: Radiation Oncology

## 2021-07-30 ENCOUNTER — Other Ambulatory Visit: Payer: Self-pay

## 2021-07-30 DIAGNOSIS — Z191 Hormone sensitive malignancy status: Secondary | ICD-10-CM | POA: Diagnosis not present

## 2021-07-30 DIAGNOSIS — C61 Malignant neoplasm of prostate: Secondary | ICD-10-CM | POA: Diagnosis not present

## 2021-07-30 DIAGNOSIS — Z510 Encounter for antineoplastic radiation therapy: Secondary | ICD-10-CM | POA: Diagnosis not present

## 2021-07-30 LAB — RAD ONC ARIA SESSION SUMMARY
Course Elapsed Days: 27
Plan Fractions Treated to Date: 19
Plan Prescribed Dose Per Fraction: 1.8 Gy
Plan Total Fractions Prescribed: 25
Plan Total Prescribed Dose: 45 Gy
Reference Point Dosage Given to Date: 34.2 Gy
Reference Point Session Dosage Given: 1.8 Gy
Session Number: 19

## 2021-07-31 ENCOUNTER — Ambulatory Visit
Admission: RE | Admit: 2021-07-31 | Discharge: 2021-07-31 | Disposition: A | Discharge status: Home / Self Care | Payer: Medicare HMO | Source: Ambulatory Visit | Attending: Radiation Oncology | Admitting: Radiation Oncology

## 2021-07-31 ENCOUNTER — Other Ambulatory Visit: Payer: Self-pay

## 2021-07-31 DIAGNOSIS — Z510 Encounter for antineoplastic radiation therapy: Secondary | ICD-10-CM | POA: Diagnosis not present

## 2021-07-31 DIAGNOSIS — Z191 Hormone sensitive malignancy status: Secondary | ICD-10-CM | POA: Diagnosis not present

## 2021-07-31 DIAGNOSIS — E041 Nontoxic single thyroid nodule: Secondary | ICD-10-CM | POA: Insufficient documentation

## 2021-07-31 DIAGNOSIS — C61 Malignant neoplasm of prostate: Secondary | ICD-10-CM | POA: Diagnosis not present

## 2021-07-31 LAB — RAD ONC ARIA SESSION SUMMARY
Course Elapsed Days: 28
Plan Fractions Treated to Date: 20
Plan Prescribed Dose Per Fraction: 1.8 Gy
Plan Total Fractions Prescribed: 25
Plan Total Prescribed Dose: 45 Gy
Reference Point Dosage Given to Date: 36 Gy
Reference Point Session Dosage Given: 1.8 Gy
Session Number: 20

## 2021-08-01 ENCOUNTER — Other Ambulatory Visit: Payer: Self-pay

## 2021-08-01 ENCOUNTER — Ambulatory Visit
Admission: RE | Admit: 2021-08-01 | Discharge: 2021-08-01 | Disposition: A | Discharge status: Home / Self Care | Payer: Medicare HMO | Source: Ambulatory Visit | Attending: Radiation Oncology | Admitting: Radiation Oncology

## 2021-08-01 DIAGNOSIS — Z510 Encounter for antineoplastic radiation therapy: Secondary | ICD-10-CM | POA: Diagnosis not present

## 2021-08-01 DIAGNOSIS — Z191 Hormone sensitive malignancy status: Secondary | ICD-10-CM | POA: Diagnosis not present

## 2021-08-01 DIAGNOSIS — E041 Nontoxic single thyroid nodule: Secondary | ICD-10-CM | POA: Diagnosis not present

## 2021-08-01 DIAGNOSIS — C61 Malignant neoplasm of prostate: Secondary | ICD-10-CM | POA: Diagnosis not present

## 2021-08-01 LAB — RAD ONC ARIA SESSION SUMMARY
Course Elapsed Days: 29
Plan Fractions Treated to Date: 21
Plan Prescribed Dose Per Fraction: 1.8 Gy
Plan Total Fractions Prescribed: 25
Plan Total Prescribed Dose: 45 Gy
Reference Point Dosage Given to Date: 37.8 Gy
Reference Point Session Dosage Given: 1.8 Gy
Session Number: 21

## 2021-08-04 ENCOUNTER — Ambulatory Visit
Admission: RE | Admit: 2021-08-04 | Discharge: 2021-08-04 | Disposition: A | Discharge status: Home / Self Care | Payer: Medicare HMO | Source: Ambulatory Visit | Attending: Radiation Oncology | Admitting: Radiation Oncology

## 2021-08-04 ENCOUNTER — Other Ambulatory Visit: Payer: Self-pay

## 2021-08-04 DIAGNOSIS — Z510 Encounter for antineoplastic radiation therapy: Secondary | ICD-10-CM | POA: Diagnosis not present

## 2021-08-04 DIAGNOSIS — C61 Malignant neoplasm of prostate: Secondary | ICD-10-CM | POA: Diagnosis not present

## 2021-08-04 DIAGNOSIS — Z191 Hormone sensitive malignancy status: Secondary | ICD-10-CM | POA: Diagnosis not present

## 2021-08-04 DIAGNOSIS — E041 Nontoxic single thyroid nodule: Secondary | ICD-10-CM | POA: Diagnosis not present

## 2021-08-04 LAB — RAD ONC ARIA SESSION SUMMARY
Course Elapsed Days: 32
Plan Fractions Treated to Date: 22
Plan Prescribed Dose Per Fraction: 1.8 Gy
Plan Total Fractions Prescribed: 25
Plan Total Prescribed Dose: 45 Gy
Reference Point Dosage Given to Date: 39.6 Gy
Reference Point Session Dosage Given: 1.8 Gy
Session Number: 22

## 2021-08-05 ENCOUNTER — Other Ambulatory Visit (HOSPITAL_COMMUNITY)
Admission: RE | Admit: 2021-08-05 | Discharge: 2021-08-05 | Disposition: A | Discharge status: Home / Self Care | Payer: Medicare HMO | Source: Ambulatory Visit | Attending: Surgery | Admitting: Surgery

## 2021-08-05 ENCOUNTER — Ambulatory Visit
Admission: RE | Admit: 2021-08-05 | Discharge: 2021-08-05 | Disposition: A | Discharge status: Home / Self Care | Payer: Medicare HMO | Source: Ambulatory Visit | Attending: Surgery | Admitting: Surgery

## 2021-08-05 ENCOUNTER — Other Ambulatory Visit: Payer: Self-pay

## 2021-08-05 ENCOUNTER — Ambulatory Visit
Admission: RE | Admit: 2021-08-05 | Discharge: 2021-08-05 | Disposition: A | Discharge status: Home / Self Care | Payer: Medicare HMO | Source: Ambulatory Visit | Attending: Radiation Oncology | Admitting: Radiation Oncology

## 2021-08-05 DIAGNOSIS — C61 Malignant neoplasm of prostate: Secondary | ICD-10-CM | POA: Diagnosis not present

## 2021-08-05 DIAGNOSIS — Z510 Encounter for antineoplastic radiation therapy: Secondary | ICD-10-CM | POA: Diagnosis not present

## 2021-08-05 DIAGNOSIS — E041 Nontoxic single thyroid nodule: Secondary | ICD-10-CM | POA: Insufficient documentation

## 2021-08-05 DIAGNOSIS — E0789 Other specified disorders of thyroid: Secondary | ICD-10-CM | POA: Diagnosis not present

## 2021-08-05 DIAGNOSIS — Z191 Hormone sensitive malignancy status: Secondary | ICD-10-CM | POA: Diagnosis not present

## 2021-08-05 LAB — RAD ONC ARIA SESSION SUMMARY
Course Elapsed Days: 33
Plan Fractions Treated to Date: 23
Plan Prescribed Dose Per Fraction: 1.8 Gy
Plan Total Fractions Prescribed: 25
Plan Total Prescribed Dose: 45 Gy
Reference Point Dosage Given to Date: 41.4 Gy
Reference Point Session Dosage Given: 1.8 Gy
Session Number: 23

## 2021-08-06 ENCOUNTER — Other Ambulatory Visit: Payer: Self-pay

## 2021-08-06 ENCOUNTER — Ambulatory Visit
Admission: RE | Admit: 2021-08-06 | Discharge: 2021-08-06 | Disposition: A | Discharge status: Home / Self Care | Payer: Medicare HMO | Source: Ambulatory Visit | Attending: Radiation Oncology | Admitting: Radiation Oncology

## 2021-08-06 DIAGNOSIS — Z191 Hormone sensitive malignancy status: Secondary | ICD-10-CM | POA: Diagnosis not present

## 2021-08-06 DIAGNOSIS — C61 Malignant neoplasm of prostate: Secondary | ICD-10-CM | POA: Diagnosis not present

## 2021-08-06 DIAGNOSIS — Z510 Encounter for antineoplastic radiation therapy: Secondary | ICD-10-CM | POA: Diagnosis not present

## 2021-08-06 DIAGNOSIS — E041 Nontoxic single thyroid nodule: Secondary | ICD-10-CM | POA: Diagnosis not present

## 2021-08-06 LAB — RAD ONC ARIA SESSION SUMMARY
Course Elapsed Days: 34
Plan Fractions Treated to Date: 24
Plan Prescribed Dose Per Fraction: 1.8 Gy
Plan Total Fractions Prescribed: 25
Plan Total Prescribed Dose: 45 Gy
Reference Point Dosage Given to Date: 43.2 Gy
Reference Point Session Dosage Given: 1.8 Gy
Session Number: 24

## 2021-08-06 LAB — CYTOLOGY - NON PAP

## 2021-08-07 ENCOUNTER — Other Ambulatory Visit: Payer: Self-pay

## 2021-08-07 ENCOUNTER — Ambulatory Visit
Admission: RE | Admit: 2021-08-07 | Discharge: 2021-08-07 | Disposition: A | Discharge status: Home / Self Care | Payer: Medicare HMO | Source: Ambulatory Visit | Attending: Radiation Oncology | Admitting: Radiation Oncology

## 2021-08-07 DIAGNOSIS — Z191 Hormone sensitive malignancy status: Secondary | ICD-10-CM | POA: Diagnosis not present

## 2021-08-07 DIAGNOSIS — Z510 Encounter for antineoplastic radiation therapy: Secondary | ICD-10-CM | POA: Diagnosis not present

## 2021-08-07 DIAGNOSIS — E041 Nontoxic single thyroid nodule: Secondary | ICD-10-CM | POA: Diagnosis not present

## 2021-08-07 DIAGNOSIS — C61 Malignant neoplasm of prostate: Secondary | ICD-10-CM | POA: Diagnosis not present

## 2021-08-07 LAB — RAD ONC ARIA SESSION SUMMARY
Course Elapsed Days: 35
Plan Fractions Treated to Date: 25
Plan Prescribed Dose Per Fraction: 1.8 Gy
Plan Total Fractions Prescribed: 25
Plan Total Prescribed Dose: 45 Gy
Reference Point Dosage Given to Date: 45 Gy
Reference Point Session Dosage Given: 1.8 Gy
Session Number: 25

## 2021-08-08 ENCOUNTER — Other Ambulatory Visit: Payer: Self-pay

## 2021-08-08 ENCOUNTER — Ambulatory Visit
Admission: RE | Admit: 2021-08-08 | Discharge: 2021-08-08 | Disposition: A | Discharge status: Home / Self Care | Payer: Medicare HMO | Source: Ambulatory Visit | Attending: Radiation Oncology | Admitting: Radiation Oncology

## 2021-08-08 DIAGNOSIS — Z191 Hormone sensitive malignancy status: Secondary | ICD-10-CM | POA: Diagnosis not present

## 2021-08-08 DIAGNOSIS — C61 Malignant neoplasm of prostate: Secondary | ICD-10-CM | POA: Diagnosis not present

## 2021-08-08 DIAGNOSIS — Z510 Encounter for antineoplastic radiation therapy: Secondary | ICD-10-CM | POA: Diagnosis not present

## 2021-08-08 DIAGNOSIS — E041 Nontoxic single thyroid nodule: Secondary | ICD-10-CM | POA: Diagnosis not present

## 2021-08-08 LAB — RAD ONC ARIA SESSION SUMMARY
Course Elapsed Days: 36
Plan Fractions Treated to Date: 1
Plan Prescribed Dose Per Fraction: 2 Gy
Plan Total Fractions Prescribed: 15
Plan Total Prescribed Dose: 30 Gy
Reference Point Dosage Given to Date: 47 Gy
Reference Point Session Dosage Given: 2 Gy
Session Number: 26

## 2021-08-09 DIAGNOSIS — E041 Nontoxic single thyroid nodule: Secondary | ICD-10-CM | POA: Diagnosis not present

## 2021-08-10 NOTE — Progress Notes (Signed)
FNA biopsy of thyroid nodule has some atypia.  Will send for molecular genetic testing with [facility].  Will take about 2 weeks for results.  Will contact patient with result when available, as we discussed at office visit.  tmg  Armandina Gemma, [facility] [place] [facility] Office: [phone]

## 2021-08-11 ENCOUNTER — Other Ambulatory Visit: Payer: Self-pay

## 2021-08-11 ENCOUNTER — Ambulatory Visit
Admission: RE | Admit: 2021-08-11 | Discharge: 2021-08-11 | Disposition: A | Discharge status: Home / Self Care | Payer: Medicare HMO | Source: Ambulatory Visit | Attending: Radiation Oncology | Admitting: Radiation Oncology

## 2021-08-11 DIAGNOSIS — E041 Nontoxic single thyroid nodule: Secondary | ICD-10-CM | POA: Diagnosis not present

## 2021-08-11 DIAGNOSIS — Z191 Hormone sensitive malignancy status: Secondary | ICD-10-CM | POA: Diagnosis not present

## 2021-08-11 DIAGNOSIS — Z510 Encounter for antineoplastic radiation therapy: Secondary | ICD-10-CM | POA: Diagnosis not present

## 2021-08-11 DIAGNOSIS — C61 Malignant neoplasm of prostate: Secondary | ICD-10-CM | POA: Diagnosis not present

## 2021-08-11 LAB — RAD ONC ARIA SESSION SUMMARY
Course Elapsed Days: 39
Plan Fractions Treated to Date: 2
Plan Prescribed Dose Per Fraction: 2 Gy
Plan Total Fractions Prescribed: 15
Plan Total Prescribed Dose: 30 Gy
Reference Point Dosage Given to Date: 49 Gy
Reference Point Session Dosage Given: 2 Gy
Session Number: 27

## 2021-08-12 ENCOUNTER — Ambulatory Visit
Admission: RE | Admit: 2021-08-12 | Discharge: 2021-08-12 | Disposition: A | Discharge status: Home / Self Care | Payer: Medicare HMO | Source: Ambulatory Visit | Attending: Radiation Oncology | Admitting: Radiation Oncology

## 2021-08-12 ENCOUNTER — Other Ambulatory Visit: Payer: Self-pay

## 2021-08-12 DIAGNOSIS — C61 Malignant neoplasm of prostate: Secondary | ICD-10-CM | POA: Diagnosis not present

## 2021-08-12 DIAGNOSIS — Z191 Hormone sensitive malignancy status: Secondary | ICD-10-CM | POA: Diagnosis not present

## 2021-08-12 DIAGNOSIS — Z510 Encounter for antineoplastic radiation therapy: Secondary | ICD-10-CM | POA: Diagnosis not present

## 2021-08-12 DIAGNOSIS — E041 Nontoxic single thyroid nodule: Secondary | ICD-10-CM | POA: Diagnosis not present

## 2021-08-12 LAB — RAD ONC ARIA SESSION SUMMARY
Course Elapsed Days: 40
Plan Fractions Treated to Date: 3
Plan Prescribed Dose Per Fraction: 2 Gy
Plan Total Fractions Prescribed: 15
Plan Total Prescribed Dose: 30 Gy
Reference Point Dosage Given to Date: 51 Gy
Reference Point Session Dosage Given: 2 Gy
Session Number: 28

## 2021-08-13 ENCOUNTER — Other Ambulatory Visit: Payer: Self-pay

## 2021-08-13 ENCOUNTER — Ambulatory Visit
Admission: RE | Admit: 2021-08-13 | Discharge: 2021-08-13 | Disposition: A | Discharge status: Home / Self Care | Payer: Medicare HMO | Source: Ambulatory Visit | Attending: Radiation Oncology | Admitting: Radiation Oncology

## 2021-08-13 DIAGNOSIS — E041 Nontoxic single thyroid nodule: Secondary | ICD-10-CM | POA: Diagnosis not present

## 2021-08-13 DIAGNOSIS — C61 Malignant neoplasm of prostate: Secondary | ICD-10-CM | POA: Diagnosis not present

## 2021-08-13 DIAGNOSIS — Z191 Hormone sensitive malignancy status: Secondary | ICD-10-CM | POA: Diagnosis not present

## 2021-08-13 DIAGNOSIS — Z510 Encounter for antineoplastic radiation therapy: Secondary | ICD-10-CM | POA: Diagnosis not present

## 2021-08-13 LAB — RAD ONC ARIA SESSION SUMMARY
Course Elapsed Days: 41
Plan Fractions Treated to Date: 4
Plan Prescribed Dose Per Fraction: 2 Gy
Plan Total Fractions Prescribed: 15
Plan Total Prescribed Dose: 30 Gy
Reference Point Dosage Given to Date: 53 Gy
Reference Point Session Dosage Given: 2 Gy
Session Number: 29

## 2021-08-14 ENCOUNTER — Other Ambulatory Visit: Payer: Self-pay

## 2021-08-14 ENCOUNTER — Ambulatory Visit
Admission: RE | Admit: 2021-08-14 | Discharge: 2021-08-14 | Disposition: A | Discharge status: Home / Self Care | Payer: Medicare HMO | Source: Ambulatory Visit | Attending: Radiation Oncology | Admitting: Radiation Oncology

## 2021-08-14 DIAGNOSIS — Z510 Encounter for antineoplastic radiation therapy: Secondary | ICD-10-CM | POA: Diagnosis not present

## 2021-08-14 DIAGNOSIS — E041 Nontoxic single thyroid nodule: Secondary | ICD-10-CM | POA: Diagnosis not present

## 2021-08-14 DIAGNOSIS — C61 Malignant neoplasm of prostate: Secondary | ICD-10-CM | POA: Diagnosis not present

## 2021-08-14 DIAGNOSIS — Z191 Hormone sensitive malignancy status: Secondary | ICD-10-CM | POA: Diagnosis not present

## 2021-08-14 LAB — RAD ONC ARIA SESSION SUMMARY
Course Elapsed Days: 42
Plan Fractions Treated to Date: 5
Plan Prescribed Dose Per Fraction: 2 Gy
Plan Total Fractions Prescribed: 15
Plan Total Prescribed Dose: 30 Gy
Reference Point Dosage Given to Date: 55 Gy
Reference Point Session Dosage Given: 2 Gy
Session Number: 30

## 2021-08-15 ENCOUNTER — Other Ambulatory Visit: Payer: Self-pay

## 2021-08-15 ENCOUNTER — Ambulatory Visit
Admission: RE | Admit: 2021-08-15 | Discharge: 2021-08-15 | Disposition: A | Discharge status: Home / Self Care | Payer: Medicare HMO | Source: Ambulatory Visit | Attending: Radiation Oncology | Admitting: Radiation Oncology

## 2021-08-15 DIAGNOSIS — Z510 Encounter for antineoplastic radiation therapy: Secondary | ICD-10-CM | POA: Diagnosis not present

## 2021-08-15 DIAGNOSIS — C61 Malignant neoplasm of prostate: Secondary | ICD-10-CM | POA: Diagnosis not present

## 2021-08-15 DIAGNOSIS — Z191 Hormone sensitive malignancy status: Secondary | ICD-10-CM | POA: Diagnosis not present

## 2021-08-15 DIAGNOSIS — E041 Nontoxic single thyroid nodule: Secondary | ICD-10-CM | POA: Diagnosis not present

## 2021-08-15 LAB — RAD ONC ARIA SESSION SUMMARY
Course Elapsed Days: 43
Plan Fractions Treated to Date: 6
Plan Prescribed Dose Per Fraction: 2 Gy
Plan Total Fractions Prescribed: 15
Plan Total Prescribed Dose: 30 Gy
Reference Point Dosage Given to Date: 57 Gy
Reference Point Session Dosage Given: 2 Gy
Session Number: 31

## 2021-08-18 ENCOUNTER — Other Ambulatory Visit: Payer: Self-pay

## 2021-08-18 ENCOUNTER — Ambulatory Visit
Admission: RE | Admit: 2021-08-18 | Discharge: 2021-08-18 | Disposition: A | Discharge status: Home / Self Care | Payer: Medicare HMO | Source: Ambulatory Visit | Attending: Radiation Oncology | Admitting: Radiation Oncology

## 2021-08-18 ENCOUNTER — Ambulatory Visit: Payer: Medicare HMO

## 2021-08-18 DIAGNOSIS — C61 Malignant neoplasm of prostate: Secondary | ICD-10-CM | POA: Diagnosis not present

## 2021-08-18 DIAGNOSIS — Z191 Hormone sensitive malignancy status: Secondary | ICD-10-CM | POA: Diagnosis not present

## 2021-08-18 DIAGNOSIS — Z510 Encounter for antineoplastic radiation therapy: Secondary | ICD-10-CM | POA: Diagnosis not present

## 2021-08-18 DIAGNOSIS — E041 Nontoxic single thyroid nodule: Secondary | ICD-10-CM | POA: Diagnosis not present

## 2021-08-18 LAB — RAD ONC ARIA SESSION SUMMARY
Course Elapsed Days: 46
Plan Fractions Treated to Date: 7
Plan Prescribed Dose Per Fraction: 2 Gy
Plan Total Fractions Prescribed: 15
Plan Total Prescribed Dose: 30 Gy
Reference Point Dosage Given to Date: 59 Gy
Reference Point Session Dosage Given: 2 Gy
Session Number: 32

## 2021-08-19 ENCOUNTER — Ambulatory Visit
Admission: RE | Admit: 2021-08-19 | Discharge: 2021-08-19 | Disposition: A | Discharge status: Home / Self Care | Payer: Medicare HMO | Source: Ambulatory Visit | Attending: Radiation Oncology | Admitting: Radiation Oncology

## 2021-08-19 ENCOUNTER — Other Ambulatory Visit: Payer: Self-pay

## 2021-08-19 ENCOUNTER — Ambulatory Visit: Payer: Medicare HMO

## 2021-08-19 ENCOUNTER — Encounter (HOSPITAL_COMMUNITY): Payer: Self-pay

## 2021-08-19 DIAGNOSIS — C61 Malignant neoplasm of prostate: Secondary | ICD-10-CM | POA: Diagnosis not present

## 2021-08-19 DIAGNOSIS — E041 Nontoxic single thyroid nodule: Secondary | ICD-10-CM | POA: Diagnosis not present

## 2021-08-19 DIAGNOSIS — Z510 Encounter for antineoplastic radiation therapy: Secondary | ICD-10-CM | POA: Diagnosis not present

## 2021-08-19 DIAGNOSIS — Z191 Hormone sensitive malignancy status: Secondary | ICD-10-CM | POA: Diagnosis not present

## 2021-08-19 LAB — RAD ONC ARIA SESSION SUMMARY
Course Elapsed Days: 47
Plan Fractions Treated to Date: 8
Plan Prescribed Dose Per Fraction: 2 Gy
Plan Total Fractions Prescribed: 15
Plan Total Prescribed Dose: 30 Gy
Reference Point Dosage Given to Date: 61 Gy
Reference Point Session Dosage Given: 2 Gy
Session Number: 33

## 2021-08-19 NOTE — Progress Notes (Signed)
AFIRMA result is benign.  Good news!  No plans for surgery at this time.  Will see in one year with repeat USN and TSH level.  Claiborne Billings - please arrange follow up with me.  tmg  Armandina Gemma, [facility] [place] [facility] Office: [phone]

## 2021-08-20 ENCOUNTER — Other Ambulatory Visit: Payer: Self-pay

## 2021-08-20 ENCOUNTER — Ambulatory Visit
Admission: RE | Admit: 2021-08-20 | Discharge: 2021-08-20 | Disposition: A | Discharge status: Home / Self Care | Payer: Medicare HMO | Source: Ambulatory Visit | Attending: Radiation Oncology | Admitting: Radiation Oncology

## 2021-08-20 ENCOUNTER — Ambulatory Visit: Payer: Medicare HMO

## 2021-08-20 DIAGNOSIS — E041 Nontoxic single thyroid nodule: Secondary | ICD-10-CM | POA: Diagnosis not present

## 2021-08-20 DIAGNOSIS — C61 Malignant neoplasm of prostate: Secondary | ICD-10-CM | POA: Diagnosis not present

## 2021-08-20 DIAGNOSIS — Z191 Hormone sensitive malignancy status: Secondary | ICD-10-CM | POA: Diagnosis not present

## 2021-08-20 DIAGNOSIS — Z510 Encounter for antineoplastic radiation therapy: Secondary | ICD-10-CM | POA: Diagnosis not present

## 2021-08-20 LAB — RAD ONC ARIA SESSION SUMMARY
Course Elapsed Days: 48
Plan Fractions Treated to Date: 9
Plan Prescribed Dose Per Fraction: 2 Gy
Plan Total Fractions Prescribed: 15
Plan Total Prescribed Dose: 30 Gy
Reference Point Dosage Given to Date: 63 Gy
Reference Point Session Dosage Given: 2 Gy
Session Number: 34

## 2021-08-21 ENCOUNTER — Other Ambulatory Visit: Payer: Self-pay

## 2021-08-21 ENCOUNTER — Ambulatory Visit
Admission: RE | Admit: 2021-08-21 | Discharge: 2021-08-21 | Disposition: A | Discharge status: Home / Self Care | Payer: Medicare HMO | Source: Ambulatory Visit | Attending: Radiation Oncology | Admitting: Radiation Oncology

## 2021-08-21 DIAGNOSIS — E041 Nontoxic single thyroid nodule: Secondary | ICD-10-CM | POA: Diagnosis not present

## 2021-08-21 DIAGNOSIS — C61 Malignant neoplasm of prostate: Secondary | ICD-10-CM | POA: Diagnosis not present

## 2021-08-21 DIAGNOSIS — Z510 Encounter for antineoplastic radiation therapy: Secondary | ICD-10-CM | POA: Diagnosis not present

## 2021-08-21 DIAGNOSIS — Z191 Hormone sensitive malignancy status: Secondary | ICD-10-CM | POA: Diagnosis not present

## 2021-08-21 LAB — RAD ONC ARIA SESSION SUMMARY
Course Elapsed Days: 49
Plan Fractions Treated to Date: 10
Plan Prescribed Dose Per Fraction: 2 Gy
Plan Total Fractions Prescribed: 15
Plan Total Prescribed Dose: 30 Gy
Reference Point Dosage Given to Date: 65 Gy
Reference Point Session Dosage Given: 2 Gy
Session Number: 35

## 2021-08-22 ENCOUNTER — Other Ambulatory Visit: Payer: Self-pay

## 2021-08-22 ENCOUNTER — Ambulatory Visit
Admission: RE | Admit: 2021-08-22 | Discharge: 2021-08-22 | Disposition: A | Discharge status: Home / Self Care | Payer: Medicare HMO | Source: Ambulatory Visit | Attending: Radiation Oncology | Admitting: Radiation Oncology

## 2021-08-22 DIAGNOSIS — C61 Malignant neoplasm of prostate: Secondary | ICD-10-CM | POA: Diagnosis not present

## 2021-08-22 DIAGNOSIS — Z510 Encounter for antineoplastic radiation therapy: Secondary | ICD-10-CM | POA: Diagnosis not present

## 2021-08-22 DIAGNOSIS — Z191 Hormone sensitive malignancy status: Secondary | ICD-10-CM | POA: Diagnosis not present

## 2021-08-22 DIAGNOSIS — E041 Nontoxic single thyroid nodule: Secondary | ICD-10-CM | POA: Diagnosis not present

## 2021-08-22 LAB — RAD ONC ARIA SESSION SUMMARY
Course Elapsed Days: 50
Plan Fractions Treated to Date: 11
Plan Prescribed Dose Per Fraction: 2 Gy
Plan Total Fractions Prescribed: 15
Plan Total Prescribed Dose: 30 Gy
Reference Point Dosage Given to Date: 67 Gy
Reference Point Session Dosage Given: 2 Gy
Session Number: 36

## 2021-08-25 ENCOUNTER — Other Ambulatory Visit: Payer: Self-pay

## 2021-08-25 ENCOUNTER — Ambulatory Visit
Admission: RE | Admit: 2021-08-25 | Discharge: 2021-08-25 | Disposition: A | Discharge status: Home / Self Care | Payer: Medicare HMO | Source: Ambulatory Visit | Attending: Radiation Oncology | Admitting: Radiation Oncology

## 2021-08-25 DIAGNOSIS — C61 Malignant neoplasm of prostate: Secondary | ICD-10-CM | POA: Diagnosis not present

## 2021-08-25 DIAGNOSIS — E041 Nontoxic single thyroid nodule: Secondary | ICD-10-CM | POA: Diagnosis not present

## 2021-08-25 DIAGNOSIS — Z510 Encounter for antineoplastic radiation therapy: Secondary | ICD-10-CM | POA: Diagnosis not present

## 2021-08-25 DIAGNOSIS — Z191 Hormone sensitive malignancy status: Secondary | ICD-10-CM | POA: Diagnosis not present

## 2021-08-25 LAB — RAD ONC ARIA SESSION SUMMARY
Course Elapsed Days: 53
Plan Fractions Treated to Date: 12
Plan Prescribed Dose Per Fraction: 2 Gy
Plan Total Fractions Prescribed: 15
Plan Total Prescribed Dose: 30 Gy
Reference Point Dosage Given to Date: 69 Gy
Reference Point Session Dosage Given: 2 Gy
Session Number: 37

## 2021-08-26 ENCOUNTER — Ambulatory Visit
Admission: RE | Admit: 2021-08-26 | Discharge: 2021-08-26 | Disposition: A | Discharge status: Home / Self Care | Payer: Medicare HMO | Source: Ambulatory Visit | Attending: Radiation Oncology | Admitting: Radiation Oncology

## 2021-08-26 ENCOUNTER — Other Ambulatory Visit: Payer: Self-pay

## 2021-08-26 DIAGNOSIS — Z510 Encounter for antineoplastic radiation therapy: Secondary | ICD-10-CM | POA: Diagnosis not present

## 2021-08-26 DIAGNOSIS — Z191 Hormone sensitive malignancy status: Secondary | ICD-10-CM | POA: Diagnosis not present

## 2021-08-26 DIAGNOSIS — C61 Malignant neoplasm of prostate: Secondary | ICD-10-CM | POA: Diagnosis not present

## 2021-08-26 DIAGNOSIS — E041 Nontoxic single thyroid nodule: Secondary | ICD-10-CM | POA: Diagnosis not present

## 2021-08-26 LAB — RAD ONC ARIA SESSION SUMMARY
Course Elapsed Days: 54
Plan Fractions Treated to Date: 13
Plan Prescribed Dose Per Fraction: 2 Gy
Plan Total Fractions Prescribed: 15
Plan Total Prescribed Dose: 30 Gy
Reference Point Dosage Given to Date: 71 Gy
Reference Point Session Dosage Given: 2 Gy
Session Number: 38

## 2021-08-27 ENCOUNTER — Ambulatory Visit
Admission: RE | Admit: 2021-08-27 | Discharge: 2021-08-27 | Disposition: A | Discharge status: Home / Self Care | Payer: Medicare HMO | Source: Ambulatory Visit | Attending: Radiation Oncology | Admitting: Radiation Oncology

## 2021-08-27 ENCOUNTER — Other Ambulatory Visit: Payer: Self-pay

## 2021-08-27 DIAGNOSIS — C61 Malignant neoplasm of prostate: Secondary | ICD-10-CM | POA: Diagnosis not present

## 2021-08-27 DIAGNOSIS — Z510 Encounter for antineoplastic radiation therapy: Secondary | ICD-10-CM | POA: Diagnosis not present

## 2021-08-27 DIAGNOSIS — E041 Nontoxic single thyroid nodule: Secondary | ICD-10-CM | POA: Diagnosis not present

## 2021-08-27 DIAGNOSIS — Z191 Hormone sensitive malignancy status: Secondary | ICD-10-CM | POA: Diagnosis not present

## 2021-08-27 LAB — RAD ONC ARIA SESSION SUMMARY
Course Elapsed Days: 55
Plan Fractions Treated to Date: 14
Plan Prescribed Dose Per Fraction: 2 Gy
Plan Total Fractions Prescribed: 15
Plan Total Prescribed Dose: 30 Gy
Reference Point Dosage Given to Date: 73 Gy
Reference Point Session Dosage Given: 2 Gy
Session Number: 39

## 2021-08-28 ENCOUNTER — Ambulatory Visit: Payer: Medicare HMO

## 2021-08-28 ENCOUNTER — Ambulatory Visit
Admission: RE | Admit: 2021-08-28 | Discharge: 2021-08-28 | Disposition: A | Discharge status: Home / Self Care | Payer: Medicare HMO | Source: Ambulatory Visit | Attending: Radiation Oncology | Admitting: Radiation Oncology

## 2021-08-28 ENCOUNTER — Encounter: Payer: Self-pay | Admitting: Urology

## 2021-08-28 ENCOUNTER — Other Ambulatory Visit: Payer: Self-pay

## 2021-08-28 DIAGNOSIS — Z510 Encounter for antineoplastic radiation therapy: Secondary | ICD-10-CM | POA: Diagnosis not present

## 2021-08-28 DIAGNOSIS — C61 Malignant neoplasm of prostate: Secondary | ICD-10-CM | POA: Diagnosis not present

## 2021-08-28 DIAGNOSIS — E041 Nontoxic single thyroid nodule: Secondary | ICD-10-CM | POA: Diagnosis not present

## 2021-08-28 DIAGNOSIS — Z191 Hormone sensitive malignancy status: Secondary | ICD-10-CM | POA: Diagnosis not present

## 2021-08-28 LAB — RAD ONC ARIA SESSION SUMMARY
Course Elapsed Days: 56
Plan Fractions Treated to Date: 15
Plan Prescribed Dose Per Fraction: 2 Gy
Plan Total Fractions Prescribed: 15
Plan Total Prescribed Dose: 30 Gy
Reference Point Dosage Given to Date: 75 Gy
Reference Point Session Dosage Given: 2 Gy
Session Number: 40

## 2021-08-29 ENCOUNTER — Ambulatory Visit: Payer: Medicare HMO

## 2021-09-01 ENCOUNTER — Ambulatory Visit: Payer: Medicare HMO

## 2021-09-03 ENCOUNTER — Ambulatory Visit: Payer: Medicare HMO

## 2021-09-15 DIAGNOSIS — C61 Malignant neoplasm of prostate: Secondary | ICD-10-CM | POA: Diagnosis not present

## 2021-09-22 DIAGNOSIS — R3915 Urgency of urination: Secondary | ICD-10-CM | POA: Diagnosis not present

## 2021-09-22 DIAGNOSIS — C61 Malignant neoplasm of prostate: Secondary | ICD-10-CM | POA: Diagnosis not present

## 2021-09-22 DIAGNOSIS — N5201 Erectile dysfunction due to arterial insufficiency: Secondary | ICD-10-CM | POA: Diagnosis not present

## 2021-10-01 ENCOUNTER — Other Ambulatory Visit: Payer: Self-pay | Admitting: Family Medicine

## 2021-10-03 ENCOUNTER — Encounter: Payer: Self-pay | Admitting: Urology

## 2021-10-03 NOTE — Progress Notes (Signed)
Telephone appointment. I verified patient's identity and began nursing interview. No issues reported at this time.  Meaningful use complete. I-PSS score of 0. No urinary management medications. Urology appt- Aug, 2023  Reminded patient of his 10:00am-10/08/21 telephone appointment w/ Ashlyn Bruning PA-C. I left my extension [phone] in case patient needs anything. Patient verbalized understanding.  Patient contact [phone]

## 2021-10-08 ENCOUNTER — Ambulatory Visit
Admission: RE | Admit: 2021-10-08 | Discharge: 2021-10-08 | Disposition: A | Discharge status: Home / Self Care | Payer: Medicare HMO | Source: Ambulatory Visit | Attending: Urology | Admitting: Urology

## 2021-10-08 DIAGNOSIS — C61 Malignant neoplasm of prostate: Secondary | ICD-10-CM | POA: Insufficient documentation

## 2021-10-08 NOTE — Progress Notes (Signed)
  Radiation Oncology         [phone] ________________________________  Name: Casey [name] MRN: [id number]  Date: 08/28/2021  DOB: 22-Jul-1955  End of Treatment Note  Diagnosis:    66 y.o. gentleman with Stage T1c adenocarcinoma of the prostate with Gleason score of 4+4, and PSA of 13.5.     Indication for treatment:  Curative, Definitive Radiotherapy       Radiation treatment dates:   07/03/21 - 08/28/21  Site/dose:  1. The prostate, seminal vesicles, and pelvic lymph nodes were initially treated to 45 Gy in 25 fractions of 1.8 Gy  2. The prostate only was boosted to 75 Gy with 15 additional fractions of 2.0 Gy   Beams/energy:  1. The prostate, seminal vesicles, and pelvic lymph nodes were initially treated using VMAT intensity modulated radiotherapy delivering 6 megavolt photons. Image guidance was performed with CB-CT studies prior to each fraction. He was immobilized with a body fix lower extremity mold.  2. the prostate only was boosted using VMAT intensity modulated radiotherapy delivering 6 megavolt photons. Image guidance was performed with CB-CT studies prior to each fraction. He was immobilized with a body fix lower extremity mold.  Narrative: The patient tolerated radiation treatment relatively well with only minor urinary irritation and modest fatigue.  He did report nocturia x2 and occasional diarrhea. The diarrhea was managed with Imodium as needed and he denied abdominal pain, nausea or vomiting.  Plan: The patient has completed radiation treatment. He will return to radiation oncology clinic for routine followup in one month. I advised him to call or return sooner if he has any questions or concerns related to his recovery or treatment. ________________________________  Sheral Apley. Tammi Klippel, M.D.

## 2021-10-08 NOTE — Progress Notes (Signed)
Radiation Oncology         [phone] ________________________________  Name: Casey [name] MRN: [id number]  Date: 10/08/2021  DOB: [birth year]  Post Treatment Note  CC: Lesleigh Noe, MD  Irine Seal, MD  Diagnosis:    66 y.o. gentleman with Stage T1c adenocarcinoma of the prostate with Gleason score of 4+4, and PSA of 13.5.  Interval Since Last Radiation:  6 weeks (concurrent with LT-ADT) 07/03/21 - 08/28/21: 1. The prostate, seminal vesicles, and pelvic lymph nodes were initially treated to 45 Gy in 25 fractions of 1.8 Gy  2. The prostate only was boosted to 75 Gy with 15 additional fractions of 2.0 Gy    Narrative:  I spoke with the patient to conduct his routine scheduled 1 month follow up visit via telephone to spare the patient unnecessary potential exposure in the healthcare setting during the current COVID-19 pandemic.  The patient was notified in advance and gave permission to proceed with this visit format.  He tolerated radiation treatment relatively well with only minor urinary irritation and modest fatigue.  He did report nocturia x2 and occasional diarrhea. The diarrhea was managed with Imodium as needed and he denied abdominal pain, nausea or vomiting.                              On review of systems, the patient states that he is doing very well in general.  His LUTS have completely resolved and he is no longer having any bowel issues.  He does continue with fatigue and hot flashes associated with ADT but overall, feels like he is tolerating the treatment well.  He had a recent follow-up visit with Dr. Jeffie Pollock on 09/22/2021 with labs prior to that visit which showed his PSA is responding very nicely, decreased down to 0.042 which he is pleased with.  ALLERGIES:  is allergic to prednisone, codeine, and hydrocodone.  Meds: Current Outpatient Medications  Medication Sig Dispense Refill   acetaminophen (TYLENOL) 500 MG tablet Take 1,000 mg by mouth 2 (two) times daily.      amLODipine (NORVASC) 5 MG tablet Take 1 tablet (5 mg total) by mouth daily. 90 tablet 0   cetirizine (ZYRTEC) 10 MG tablet Take 10 mg by mouth daily.     clonazePAM (KLONOPIN) 1 MG tablet Take 1-2 tablets (1-2 mg total) by mouth at bedtime. 180 tablet 0   losartan (COZAAR) 50 MG tablet Take 1 tablet (50 mg total) by mouth at bedtime. 90 tablet 3   pantoprazole (PROTONIX) 40 MG tablet Take 1 tablet (40 mg total) by mouth daily. 90 tablet 1   Probiotic Product (PROBIOTIC DAILY PO) Take 1 capsule by mouth daily. PB8 Probiotic     traMADol (ULTRAM) 50 MG tablet Take 1 tablet (50 mg total) by mouth every 6 (six) hours as needed for severe pain. TAKE 1 TABLET (50 MG TOTAL) BY MOUTH 2 (TWO) TIMES DAILY AS NEEDED 30 tablet 0   traZODone (DESYREL) 100 MG tablet TAKE 2 TABLETS BY MOUTH AT BEDTIME. 180 tablet 0   No current facility-administered medications for this encounter.    Physical Findings:  vitals were not taken for this visit.  Pain Assessment Pain Score: 0-No pain/10 Unable to assess due to telephone follow-up visit format.  Lab Findings: Lab Results  Component Value Date   WBC 6.4 12/26/2020   HGB 13.3 06/13/2021   HCT 39.0 06/13/2021   MCV 90.1 12/26/2020  PLT 225.0 12/26/2020     Radiographic Findings: No results found.  Impression/Plan: 1.  66 y.o. gentleman with Stage T1c adenocarcinoma of the prostate with Gleason score of 4+4, and PSA of 13.5. He will continue to follow up with urology for ongoing PSA determinations and has an appointment scheduled with Dr. Jeffie Pollock in 11/2021 when he will be due for his next Eligard ADT injection. He understands what to expect with regards to PSA monitoring going forward. I will look forward to following his response to treatment via correspondence with urology, and would be happy to continue to participate in his care if clinically indicated. I talked to the patient about what to expect in the future, including his risk for erectile  dysfunction and rectal bleeding. I encouraged him to call or return to the office if he has any questions regarding his previous radiation or possible radiation side effects. He was comfortable with this plan and will follow up as needed.     Nicholos Johns, PA-C

## 2021-10-14 ENCOUNTER — Encounter: Payer: Self-pay | Admitting: Family Medicine

## 2021-10-14 DIAGNOSIS — L989 Disorder of the skin and subcutaneous tissue, unspecified: Secondary | ICD-10-CM

## 2021-10-14 DIAGNOSIS — Z85828 Personal history of other malignant neoplasm of skin: Secondary | ICD-10-CM

## 2021-10-15 ENCOUNTER — Ambulatory Visit (INDEPENDENT_AMBULATORY_CARE_PROVIDER_SITE_OTHER): Payer: Medicare HMO | Admitting: Family Medicine

## 2021-10-15 ENCOUNTER — Encounter: Payer: Self-pay | Admitting: Family Medicine

## 2021-10-15 VITALS — BP 128/70 | HR 71 | Temp 97.2°F | Wt 222.4 lb

## 2021-10-15 DIAGNOSIS — Z85828 Personal history of other malignant neoplasm of skin: Secondary | ICD-10-CM

## 2021-10-15 DIAGNOSIS — M545 Low back pain, unspecified: Secondary | ICD-10-CM | POA: Diagnosis not present

## 2021-10-15 DIAGNOSIS — C61 Malignant neoplasm of prostate: Secondary | ICD-10-CM

## 2021-10-15 DIAGNOSIS — L819 Disorder of pigmentation, unspecified: Secondary | ICD-10-CM | POA: Diagnosis not present

## 2021-10-15 DIAGNOSIS — F5101 Primary insomnia: Secondary | ICD-10-CM

## 2021-10-15 DIAGNOSIS — G8929 Other chronic pain: Secondary | ICD-10-CM

## 2021-10-15 DIAGNOSIS — R69 Illness, unspecified: Secondary | ICD-10-CM | POA: Diagnosis not present

## 2021-10-15 MED ORDER — TRAMADOL HCL 50 MG PO TABS: 50.0000 mg | ORAL_TABLET | Freq: Four times a day (QID) | ORAL | 0 refills | Status: DC | PRN

## 2021-10-15 NOTE — Assessment & Plan Note (Signed)
He reports PSA has been suppressed.  Still has another hormone treatment left.  Overall has tolerated his treatment well.

## 2021-10-15 NOTE — Addendum Note (Signed)
Addended by: Lesleigh Noe on: 10/15/2021 08:39 AM   Modules accepted: Orders

## 2021-10-15 NOTE — Assessment & Plan Note (Signed)
He has noticed some worsening pain recently.  Encouraged trial of physical therapy referral placed.  Refill tramadol 50 mg as needed.  Fill was in April.

## 2021-10-15 NOTE — Assessment & Plan Note (Signed)
He has had to increase his Klonopin to 2 mg since his diagnosis of cancer, he is hoping to be able to titrate back down to 1 once he has completed therapy.

## 2021-10-15 NOTE — Progress Notes (Signed)
Subjective:     Casey [name] is a 66 y.o. male presenting for Skin Problem     HPI  #skin lesions - referral to dermatology - has appointment in March  #Back pain - stable - needs tramadol refill - doing OK overall - takes 50 mg prn - last fill April   #prostate cancer  - completed therapy - radiation - PSA is suppressed - still doing doing hormone shorts - sleep has been worse with cancer treatment  #HTN - well controlled - taking meds  Review of Systems   Social History   Tobacco Use  Smoking Status Never  Smokeless Tobacco Never        Objective:    BP Readings from Last 3 Encounters:  10/15/21 128/70  06/25/21 (!) 150/84  06/13/21 (!) 151/94   Wt Readings from Last 3 Encounters:  10/15/21 222 lb 6 oz (100.9 kg)  06/25/21 219 lb 8 oz (99.6 kg)  06/13/21 216 lb 3.2 oz (98.1 kg)    BP 128/70   Pulse 71   Temp (!) 97.2 F (36.2 C) (Temporal)   Wt 222 lb 6 oz (100.9 kg)   SpO2 97%   BMI 37.58 kg/m    Physical Exam Constitutional:      Appearance: Normal appearance. He is not ill-appearing or diaphoretic.  HENT:     Right Ear: External ear normal.     Left Ear: External ear normal.     Nose: Nose normal.  Eyes:     General: No scleral icterus.    Extraocular Movements: Extraocular movements intact.     Conjunctiva/sclera: Conjunctivae normal.  Cardiovascular:     Rate and Rhythm: Normal rate and regular rhythm.  Pulmonary:     Effort: Pulmonary effort is normal. No respiratory distress.     Breath sounds: Normal breath sounds. No wheezing.  Musculoskeletal:     Cervical back: Neck supple.  Skin:    General: Skin is warm and dry.     Comments: Left temple - raised flesh/pink colored papule with central dimple.  Right cheek - raised mole with asymmetry and hyperpigmentation  Neurological:     Mental Status: He is alert. Mental status is at baseline.  Psychiatric:        Mood and Affect: Mood normal.               Assessment & Plan:   Problem List Items Addressed This Visit       Genitourinary   Prostate cancer [facility])    He reports PSA has been suppressed.  Still has another hormone treatment left.  Overall has tolerated his treatment well.        Other   Insomnia    He has had to increase his Klonopin to 2 mg since his diagnosis of cancer, he is hoping to be able to titrate back down to 1 once he has completed therapy.      Low back pain - Primary    He has noticed some worsening pain recently.  Encouraged trial of physical therapy referral placed.  Refill tramadol 50 mg as needed.  Fill was in April.      Relevant Medications   traMADol (ULTRAM) 50 MG tablet   Other Relevant Orders   Ambulatory referral to Physical Therapy   History of basal cell carcinoma of skin   Pigmented skin lesion of suspected malignant nature    Presents with 2 skin lesions that have been there for roughly  1 year.  He has a history of basal cell carcinoma.  1 lesion is particularly concerning for basal cell and other is concerning for atypia.  Referral to dermatology for evaluation.        Return if symptoms worsen or fail to improve.  Lesleigh Noe, MD

## 2021-10-15 NOTE — Assessment & Plan Note (Signed)
Presents with 2 skin lesions that have been there for roughly 1 year.  He has a history of basal cell carcinoma.  1 lesion is particularly concerning for basal cell and other is concerning for atypia.  Referral to dermatology for evaluation.

## 2021-10-23 ENCOUNTER — Other Ambulatory Visit: Payer: Self-pay | Admitting: Family Medicine

## 2021-10-23 DIAGNOSIS — I10 Essential (primary) hypertension: Secondary | ICD-10-CM

## 2021-10-25 ENCOUNTER — Other Ambulatory Visit: Payer: Self-pay | Admitting: Family Medicine

## 2021-10-28 ENCOUNTER — Other Ambulatory Visit: Payer: Self-pay | Admitting: Family Medicine

## 2021-10-28 DIAGNOSIS — I10 Essential (primary) hypertension: Secondary | ICD-10-CM

## 2021-11-04 ENCOUNTER — Encounter: Payer: Self-pay | Admitting: *Deleted

## 2021-11-13 DIAGNOSIS — L570 Actinic keratosis: Secondary | ICD-10-CM | POA: Diagnosis not present

## 2021-11-13 DIAGNOSIS — Z85828 Personal history of other malignant neoplasm of skin: Secondary | ICD-10-CM | POA: Diagnosis not present

## 2021-11-13 DIAGNOSIS — D485 Neoplasm of uncertain behavior of skin: Secondary | ICD-10-CM | POA: Diagnosis not present

## 2021-11-13 DIAGNOSIS — L821 Other seborrheic keratosis: Secondary | ICD-10-CM | POA: Diagnosis not present

## 2021-11-13 DIAGNOSIS — C44319 Basal cell carcinoma of skin of other parts of face: Secondary | ICD-10-CM | POA: Diagnosis not present

## 2021-11-13 DIAGNOSIS — C4441 Basal cell carcinoma of skin of scalp and neck: Secondary | ICD-10-CM | POA: Diagnosis not present

## 2021-11-13 DIAGNOSIS — D1801 Hemangioma of skin and subcutaneous tissue: Secondary | ICD-10-CM | POA: Diagnosis not present

## 2021-11-18 ENCOUNTER — Inpatient Hospital Stay: Payer: Medicare HMO | Attending: Adult Health | Admitting: *Deleted

## 2021-11-18 DIAGNOSIS — C61 Malignant neoplasm of prostate: Secondary | ICD-10-CM

## 2021-11-20 ENCOUNTER — Encounter: Payer: Self-pay | Admitting: Family Medicine

## 2021-11-20 ENCOUNTER — Other Ambulatory Visit: Payer: Self-pay | Admitting: Family Medicine

## 2021-11-20 DIAGNOSIS — G8929 Other chronic pain: Secondary | ICD-10-CM

## 2021-11-21 ENCOUNTER — Encounter: Payer: Self-pay | Admitting: *Deleted

## 2021-11-21 NOTE — Progress Notes (Signed)
SCP reviewed and completed. 

## 2021-12-09 ENCOUNTER — Other Ambulatory Visit: Payer: Self-pay

## 2021-12-09 ENCOUNTER — Ambulatory Visit: Payer: Medicare HMO | Attending: Family Medicine | Admitting: Physical Therapy

## 2021-12-09 ENCOUNTER — Encounter: Payer: Self-pay | Admitting: Physical Therapy

## 2021-12-09 DIAGNOSIS — G8929 Other chronic pain: Secondary | ICD-10-CM | POA: Insufficient documentation

## 2021-12-09 DIAGNOSIS — R2689 Other abnormalities of gait and mobility: Secondary | ICD-10-CM | POA: Diagnosis not present

## 2021-12-09 DIAGNOSIS — M545 Low back pain, unspecified: Secondary | ICD-10-CM | POA: Diagnosis not present

## 2021-12-09 NOTE — Therapy (Unsigned)
OUTPATIENT PHYSICAL THERAPY LOWER EXTREMITY EVALUATION   Patient Name: Casey [name] MRN: [id number] DOB:02-14-1956, 66 y.o., male Today's Date: 12/10/2021   PT End of Session - 12/10/21 0817     Visit Number 1    Number of Visits 20    Date for PT Re-Evaluation 02/18/22    Authorization Type [employer]    Progress Note Due on Visit 10    PT Start Time 1500    PT Stop Time 1545    PT Time Calculation (min) 45 min    Activity Tolerance Patient tolerated treatment well    Behavior During Therapy [facility] for tasks assessed/performed             Past Medical History:  Diagnosis Date   Arthritis    mild   Cardiac arrest ([place]) 02/26/2018   x 1 minute no current cardiac issues per pt on 06-10-2021   CKD (chronic kidney disease) stage 2, GFR 60-89 ml/min    no nephrologist, managed by pcp   Complication of anesthesia    lying flat hurts back   GERD (gastroesophageal reflux disease)    Goiter    right lobe 4.7 x2.2 x 2.2 cm, left lobe 4.4x 1.3x 1.3 cm, per [place] thryoid 05-13-2021 epic,  sees dr todd gerkins at [facility] in may 2023 per pt   History of COVID-19 01/01/2021   mild all symptoms resolved   History of kidney stones    History of sleep apnea    no cpap needed since normal sleep study done last 2 times yrs ago per pt on 06-13-2021   Hypertension    Melanoma ([place]) 2008   no recurrence   PONV (postoperative nausea and vomiting)    pt prefers iv meds for ponv   Wears glasses for reading    Past Surgical History:  Procedure Laterality Date   acl repair left knee     yrs ago   CARDIAC CATHETERIZATION  2005   negative    CHOLECYSTECTOMY     yrs ago   colonscopy  05/14/2014   CYSTOSCOPY     multiple cystoscopies done in past none recent per pt on 06-10-2021   CYSTOSCOPY/RETROGRADE/URETEROSCOPY/STONE EXTRACTION WITH BASKET  04/07/2012   Procedure: CYSTOSCOPY/RETROGRADE/URETEROSCOPY/STONE EXTRACTION WITH BASKET;  Surgeon: Malka So, MD;  Location: [facility];  Service:  Urology;  Laterality: Bilateral;  cystoscopy, bilateral stent removal, bilateral ureteroscopy with bilateral stone extraction and insertion of right ureteral stent   fusion great toe right foot     2010   GOLD SEED IMPLANT N/A 06/13/2021   Procedure: GOLD SEED IMPLANT;  Surgeon: Irine Seal, MD;  Location: [facility];  Service: Urology;  Laterality: N/A;  30 MINS FOR THIS CASE   left rotator cuff repair & biceps tendon repair  2003   ligaments repair left wrist      2006   mohs prodecure  2008   left cheek   SPACE OAR INSTILLATION N/A 06/13/2021   Procedure: SPACE OAR INSTILLATION;  Surgeon: Irine Seal, MD;  Location: [facility];  Service: Urology;  Laterality: N/A;   stone extractions and urethral stents     multiple   tendons cut on left heel     for bone spur removal done yrs ago   TONSILLECTOMY     as child   UPPER GI ENDOSCOPY     not sure when last done per pt on 06-10-2021   [place]  1979   Patient Active Problem List   Diagnosis Date Noted   History of basal cell carcinoma of skin 10/15/2021   Pigmented skin lesion of suspected malignant nature 10/15/2021   Prostate cancer ([place]) 04/08/2021   Low back pain 10/04/2020   Bilateral chronic knee pain 07/25/2020   Leukocytosis 08/22/2019   Shortness of breath 08/22/2019   Chronic back pain 08/15/2019   Benzodiazepine dependence ([place]) 08/15/2019   HTN (hypertension) 03/24/2016   GERD (gastroesophageal reflux disease)    Insomnia with sleep apnea 12/06/2013   Insomnia secondary to restless leg syndrome 07/17/2013   Osteoarthritis, multiple sites 04/26/2011   Insomnia 04/26/2011    PCP: Dr. Waunita Schooner   REFERRING PROVIDER: Dr. 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 He has had several ablations and has had PT before but this has not helped relieve the pain. 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 There was nothing specific that caused the sudden flare in pain. Pt stood up in June and reached back to take wallet out of his back pocket and threw it forward and he has had increased pain ever since. He has a h/o of prostate cancer and he has since finished treatment in June.   PAIN:  Are you having pain? Yes: NPRS scale: 2-3/10 Pain location: Right side of low back located near illiac crest along paraspinal  Pain description: Sharp  Aggravating factors: No understandable cause, just happens suddenly Relieving factors: Refrains from bending or squatting or getting on his knees   PRECAUTIONS: None  WEIGHT BEARING RESTRICTIONS No  FALLS:  Has patient fallen in last 6 months? No  LIVING ENVIRONMENT: Lives with: lives with their spouse Lives in: House/apartment Stairs: Yes: External: 3 steps; none Has following equipment at home: None  OCCUPATION: Preaching and retired from physical labor   PLOF: Ryan it so his back so it feels better so he can bend, squat, and move around like her normally does. He also wants to play golf again.    OBJECTIVE:               VITALS: BP 153/85 HR 71 SpO2 96%  DIAGNOSTIC FINDINGS: CLINICAL DATA:  Low back pain twisting injury 04/16/2016, persistent low back pain, limited range of motion   EXAM: LUMBAR SPINE - COMPLETE 4+ VIEW   COMPARISON:  11/06/2014   FINDINGS: Five views of the lumbar spine submitted. No acute  fracture or subluxation. Mild anterior spurring upper endplate of L4 and L5 vertebral body. Mild disc space flattening at L5-S1 level. There is a calcified calculus in upper pole region of the left kidney measures 3 mm. Calcified calculus in midpole region of the left kidney measures 5 mm. Alignment and vertebral body heights are preserved.   IMPRESSION: No acute fracture or subluxation. Mild degenerative changes as described above. Probable left nephrolithiasis.     Electronically Signed   By: Julien Girt  Pop M.D.   On: 05/04/2016 09:05  PATIENT SURVEYS:  FOTO 45 out of 100 with target of 55   COGNITION:  Overall cognitive status: Within functional limits for tasks assessed     SENSATION: WFL    MUSCLE LENGTH: Hamstrings: Right 90 deg; Left 90 deg Thomas test: Negative bilateral   POSTURE: No Significant postural limitations  PALPATION: Right 10th rib on lateral side that extends back to spine and around to naval.   LOWER EXTREMITY ROM:                         Lumbar ROM:                                               Flexion: 90%                                               Extension:100%                                               SB R/L: 100%/100%                                               Rot R/L: 100%/100%*  Active  Right 12/09/2021 Left 12/09/2021  Hip flexion 120 120  Hip extension 30 30  Hip abduction 45 45  Hip adduction 30 30  Hip internal rotation 45 45  Hip external rotation 45 45  Knee flexion 135 135  Knee extension 0 0  Ankle dorsiflexion 20 20  Ankle plantarflexion 50 50  Ankle inversion 35 35  Ankle eversion 15 15   (Blank rows = not tested)     LOWER EXTREMITY MMT:  MMT Right eval Left eval  Hip flexion 5 5  Hip extension 4 4  Hip abduction 4+ 4+  Hip adduction 4- 4-  Hip internal rotation    Hip external rotation    Knee flexion 5 5  Knee extension 5 5  Ankle dorsiflexion 5 5  Ankle plantarflexion    Ankle inversion     Ankle eversion     (Blank rows = not tested)  LOWER EXTREMITY SPECIAL TESTS:  Hip special tests: Saralyn Pilar (FABER) test: negative, Thomas test: negative, Ober's test: negative, and Ely's test: positive   FUNCTIONAL TESTS:  5 times sit to stand: NT 30 seconds chair stand test NT  6 minute walk test: NT   GAIT: Distance walked: 50 ft  Assistive device utilized: None Level of assistance: Complete Independence Comments: No gait deficits noted     TODAY'S TREATMENT: Prone Quad stretch 2 x 30 sec  Seated ER stretch 2 x 30 sec    PATIENT EDUCATION:  Education details: form and technique for appropriate exercise  Person educated: Patient Education method: Consulting civil engineer, Demonstration, Verbal cues, and Handouts Education comprehension: verbalized understanding, returned demonstration, and verbal cues required   HOME EXERCISE PROGRAM: Access Code: [email or web address] URL: [email or web address] Date: 12/09/2021 Prepared  by: Bradly Chris  Exercises - Prone Quadriceps Stretch with Strap  - 1 x daily - 3 reps - 30-60 sec  hold - Seated Hip External Rotation Stretch  - 1 x daily - 3 reps - 30-60 sec  hold  ASSESSMENT:  CLINICAL IMPRESSION: Patient is a 66  y.o. white male who was seen today for physical therapy evaluation and treatment for chronic right side low back pain. Pt exhibits deficits that include decreased hip strength and flexibility along with increased pain with weight bearing. Given that pt has had past PT without much improvement, his rehab potential is fair.  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   OBJECTIVE IMPAIRMENTS decreased endurance, decreased strength, impaired flexibility, obesity, and pain.   ACTIVITY LIMITATIONS carrying, lifting, bending, standing, squatting, stairs, dressing, and locomotion  level  PARTICIPATION LIMITATIONS: community activity, occupation, yard work, and church  PERSONAL FACTORS Age, Past/current experiences, Time since onset of injury/illness/exacerbation, and 3+ comorbidities: h/o prostate cancer, HTN, Chronic kidney disease  are also affecting patient's functional outcome.   REHAB POTENTIAL: Fair past bout of PT, chronicity  CLINICAL DECISION MAKING: Stable/uncomplicated  EVALUATION COMPLEXITY: Low   GOALS: Goals reviewed with patient? No  SHORT TERM GOALS: Target date: 12/24/2021  Pt will be independent with HEP in order to improve strength and balance in order to decrease fall risk and improve function at home and work. Baseline: NT  Goal status: INITIAL  2.  Pt will increase 6MWT by at least 71m(1636f in order to demonstrate clinically significant improvement in cardiopulmonary endurance and community ambulation Baseline: NT  Goal status: INITIAL  LONG TERM GOALS: Target date: 02/18/2022   Patient will have improved function and activity level as evidenced by an increase in FOTO score by 10 points or more. Baseline: 45 Goal status: INITIAL  2.  Patient will ambulate >=1000 ft within 6 minutes to demonstrate that he is a community level ambulator and that his low back pain symptoms have improved to point where his aerobic endurance improves.  Baseline: NT  Goal status: INITIAL  3.  Patient will improve his hip strength by 1/3 MMT (i.e 4- to 4) for improved spinal stability and relief of low back pain.   Baseline: Hip Ext R/L 4/4, Hip Add R/L 4-/4-, Hip Abd R/L 4+/4+ Goal status: INITIAL  PLAN: PT FREQUENCY: 1-2x/week  PT DURATION: 10 weeks  PLANNED INTERVENTIONS: Therapeutic exercises, Neuromuscular re-education, Balance training, Gait training, Patient/Family education, Self Care, Joint mobilization, Stair training, Aquatic Therapy, Dry Needling, Spinal manipulation, Spinal mobilization, Cryotherapy, Moist heat, Manual therapy, and  Re-evaluation  PLAN FOR NEXT SESSION: 24m25m 5xSTS, and 30 sec chair stands   DanDaneil DanT 12/10/2021, 8:19 AM

## 2021-12-11 ENCOUNTER — Ambulatory Visit: Payer: Medicare HMO | Admitting: Physical Therapy

## 2021-12-15 DIAGNOSIS — C44319 Basal cell carcinoma of skin of other parts of face: Secondary | ICD-10-CM | POA: Diagnosis not present

## 2021-12-16 ENCOUNTER — Ambulatory Visit: Payer: Medicare HMO | Admitting: Physical Therapy

## 2021-12-16 ENCOUNTER — Telehealth: Payer: Self-pay | Admitting: Physical Therapy

## 2021-12-16 NOTE — Telephone Encounter (Signed)
Called pt to inquiry about absence from physical therapy. Did not reach patient via phone so left VM instructing patient to call back to reschedule.

## 2021-12-18 ENCOUNTER — Ambulatory Visit: Payer: Medicare HMO | Admitting: Physical Therapy

## 2021-12-23 ENCOUNTER — Encounter: Payer: Medicare HMO | Admitting: Physical Therapy

## 2021-12-24 ENCOUNTER — Telehealth: Payer: Self-pay | Admitting: Family Medicine

## 2021-12-24 NOTE — Telephone Encounter (Signed)
Patient declined AWV 

## 2021-12-25 ENCOUNTER — Encounter: Payer: Medicare HMO | Admitting: Physical Therapy

## 2021-12-30 ENCOUNTER — Encounter: Payer: Medicare HMO | Admitting: Physical Therapy

## 2021-12-30 ENCOUNTER — Other Ambulatory Visit: Payer: Self-pay

## 2021-12-30 DIAGNOSIS — K2100 Gastro-esophageal reflux disease with esophagitis, without bleeding: Secondary | ICD-10-CM

## 2021-12-30 MED ORDER — PANTOPRAZOLE SODIUM 40 MG PO TBEC: 40.0000 mg | DELAYED_RELEASE_TABLET | Freq: Every day | ORAL | 0 refills | Status: DC

## 2022-01-01 ENCOUNTER — Encounter: Payer: Medicare HMO | Admitting: Physical Therapy

## 2022-01-10 IMAGING — MR MR PROSTATE WO/W CM
14 series · 48 of 48 positions shown · IV contrast (multihance)
Comparison: None.

CLINICAL DATA: Elevated PSA.

EXAM:
MR PROSTATE WITHOUT AND WITH CONTRAST
TECHNIQUE: Multiplanar multisequence MRI images were obtained of the pelvis
centered about the prostate. Pre and post contrast images were
obtained.
CONTRAST:  20mL MULTIHANCE GADOBENATE DIMEGLUMINE 529 MG/ML IV SOLN

[Series 3: T2 · coronal · 3.0mm · 0.56mm/px · 1 of 23 slices shown (1 of 3)]
[im 1/23]
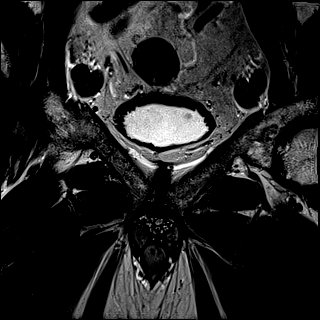

[Series 4: T1 · axial · 5.0mm · 1.25mm/px · 1 of 80 slices shown]
[im 1/80]
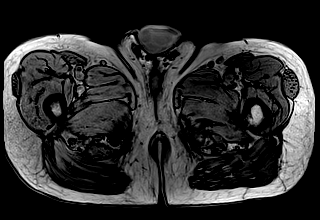

[Series 5: DWI · axial · 3.0mm · 1.75mm/px · 1 of 88 slices shown (1 of 3)]
[im 1/88]
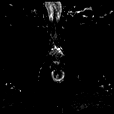

[Series 6: DWI · axial · 3.0mm · 1.75mm/px · 1 of 30 slices shown (2 of 3)]
[im 1/30]
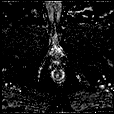

[Series 7: DWI · axial · 3.0mm · 1.75mm/px · 1 of 30 slices shown (3 of 3)]
[im 1/30]
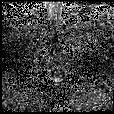

[Series 8: T2 · axial · 3.0mm · 0.56mm/px · 1 of 27 slices shown (2 of 3)]
[im 1/27]
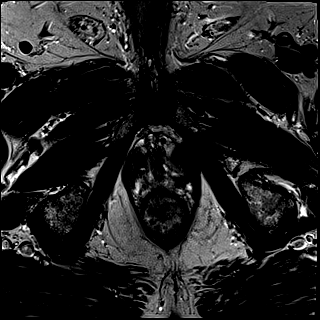

[Series 9: T2 · axial · 1.0mm · 1.04mm/px · 1 of 80 slices shown (3 of 3)]
[im 1/80]
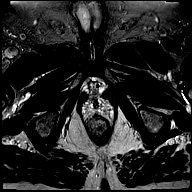

[Series 10: pre t1_twist_tra_dyn · axial · non-contrast · 3.5mm · 0.83mm/px · 1 of 24 slices shown]
[im 1/24]
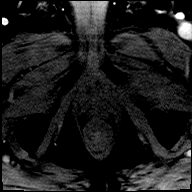

[Series 11: post t1_twist_tra_dyn-copy center · axial · non-contrast · 3.5mm · 0.83mm/px · z∈[-12,+68]mm · 6 of 408 slices shown (1 of 2)]
[im 1/408]
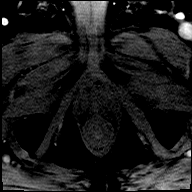
[im 82/408]
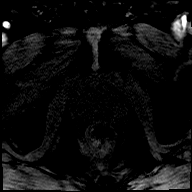
[im 163/408]
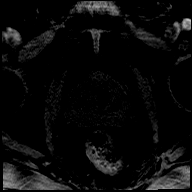
[im 245/408]
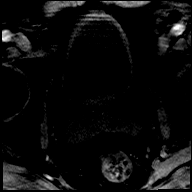
[im 326/408]
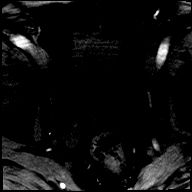
[im 408/408]
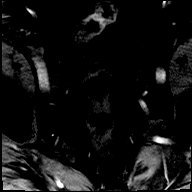

[Series 12: post t1_twist_tra_dyn-copy cent_sub · axial · 3.5mm · 0.83mm/px · z∈[-12,+68]mm · 7 of 384 slices shown (1 of 2)]
[im 1/384]
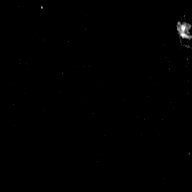
[im 64/384]
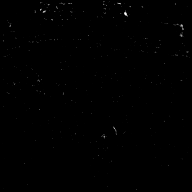
[im 128/384]
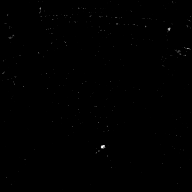
[im 192/384]
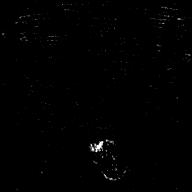
[im 256/384]
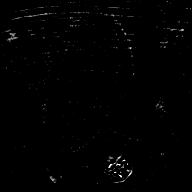
[im 320/384]
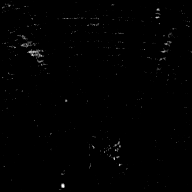
[im 384/384]
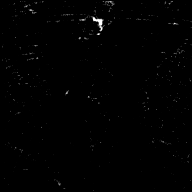

[Series 13: post t1_twist_tra_dyn-copy center · axial · non-contrast · 3.5mm · 0.83mm/px · z∈[-12,+68]mm · 13 of 720 slices shown (2 of 2)]
[im 1/720]
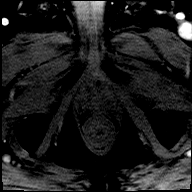
[im 60/720]
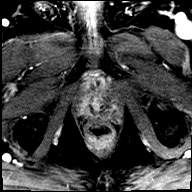
[im 120/720]
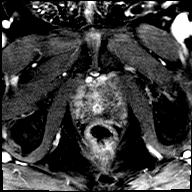
[im 180/720]
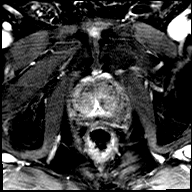
[im 240/720]
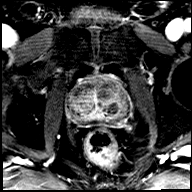
[im 300/720]
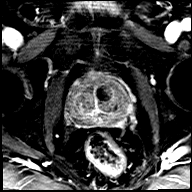
[im 360/720]
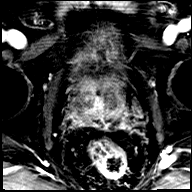
[im 420/720]
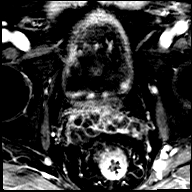
[im 480/720]
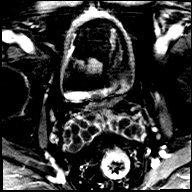
[im 540/720]
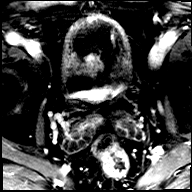
[im 600/720]
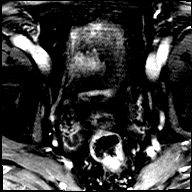
[im 660/720]
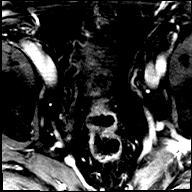
[im 720/720]
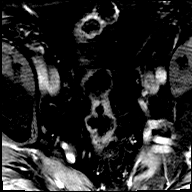

[Series 14: post t1_twist_tra_dyn-copy cent_sub · axial · 3.5mm · 0.83mm/px · z∈[-12,+68]mm · 12 of 695 slices shown (2 of 2)]
[im 1/695]
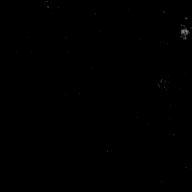
[im 64/695]
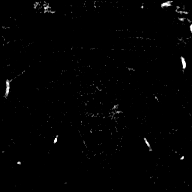
[im 127/695]
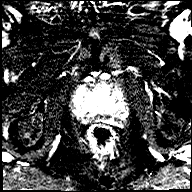
[im 190/695]
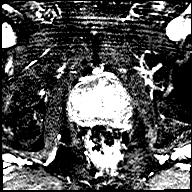
[im 253/695]
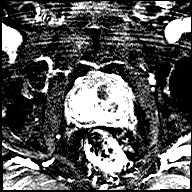
[im 316/695]
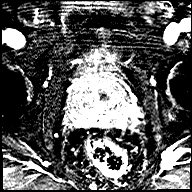
[im 379/695]
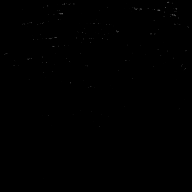
[im 442/695]
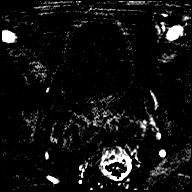
[im 505/695]
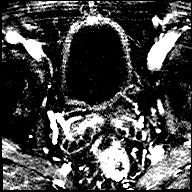
[im 568/695]
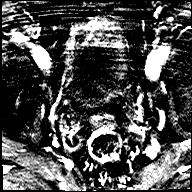
[im 631/695]
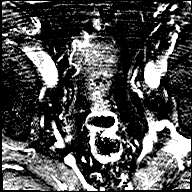
[im 695/695]
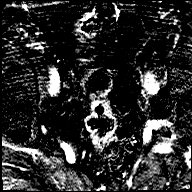

[Series 15: t1_vibe_dixon_tra_f · axial · 2.5mm · 0.91mm/px · 1 of 80 slices shown]
[im 1/80]
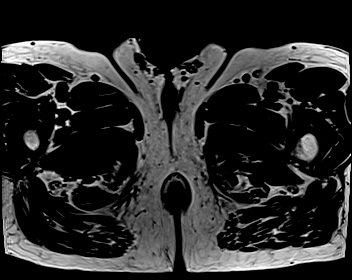

[Series 16: t1_vibe_dixon_tra_w · axial · 2.5mm · 0.91mm/px · 1 of 80 slices shown]
[im 1/80]
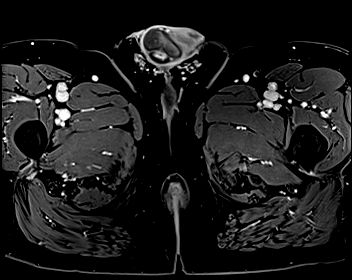

[48 of 48 positions shown; findings below may reference images not displayed]

FINDINGS: Prostate:

-- Peripheral Zone: A T2 hypointense nodule is seen in the left
posterolateral mid gland and apex, which measures 1.9 x 1.4 cm on
image [DATE]. This nodule shows marked ADC hypointensity and DWI
hyperintensity, as well as early focal contrast enhancement. Loss of
the normal capsular margin is seen in the left lateral mid gland at
the site of this nodule, suspicious for mild extracapsular
extension.

-- Transition/Central Zone: Mildly enlarged with encapsulated BPH
nodules noted; however, no suspicious nodules are identified on
T2-weighted or diffusion sequences.

-- Measurements/Volume:  4.8 by 4.0 x 5.5 cm (volume = 55 cm^3)

Transcapsular spread: Loss of the normal capsular margin is seen in
the left lateral mid gland at site of the peripheral zone nodule,
suspicious for mild extracapsular extension.

Seminal vesicle involvement:  Absent

Neurovascular bundle involvement:  Absent

Pelvic adenopathy: None visualized

Bone metastasis: None

Other: Proximal sigmoid diverticulosis noted, without evidence of
diverticulitis.
IMPRESSION: 1.9 cm peripheral zone nodule in the left posterolateral mid gland
and apex, highly suspicious for high-grade carcinoma. Probable mild
extracapsular extension also noted. PI-RADS 5 (v2.1): Very high
(clinically significant cancer highly likely)

No evidence of neurovascular bundle involvement or pelvic metastatic
disease.

(I have post-processed this exam in the DynaCAD application for
potential fusion-guided biopsy.)

## 2022-01-15 DIAGNOSIS — C61 Malignant neoplasm of prostate: Secondary | ICD-10-CM | POA: Diagnosis not present

## 2022-01-21 ENCOUNTER — Other Ambulatory Visit: Payer: Self-pay | Admitting: Family Medicine

## 2022-01-21 DIAGNOSIS — K2100 Gastro-esophageal reflux disease with esophagitis, without bleeding: Secondary | ICD-10-CM

## 2022-01-21 DIAGNOSIS — E7800 Pure hypercholesterolemia, unspecified: Secondary | ICD-10-CM | POA: Diagnosis not present

## 2022-01-21 DIAGNOSIS — N200 Calculus of kidney: Secondary | ICD-10-CM | POA: Diagnosis not present

## 2022-01-21 DIAGNOSIS — C61 Malignant neoplasm of prostate: Secondary | ICD-10-CM | POA: Diagnosis not present

## 2022-01-23 ENCOUNTER — Other Ambulatory Visit: Payer: Self-pay

## 2022-01-23 NOTE — Telephone Encounter (Signed)
Patient has appointment with Dr. Diona Browner but not until 06/08/21. Ok to send in pended refill?

## 2022-01-24 MED ORDER — TRAZODONE HCL 100 MG PO TABS: 200.0000 mg | ORAL_TABLET | Freq: Every day | ORAL | 0 refills | Status: DC

## 2022-02-04 ENCOUNTER — Other Ambulatory Visit: Payer: Self-pay | Admitting: Family Medicine

## 2022-02-04 DIAGNOSIS — F5101 Primary insomnia: Secondary | ICD-10-CM

## 2022-02-04 DIAGNOSIS — F1320 Sedative, hypnotic or anxiolytic dependence, uncomplicated: Secondary | ICD-10-CM

## 2022-02-04 NOTE — Telephone Encounter (Signed)
  Encourage patient to contact the pharmacy for refills or they can request refills through [facility]  Did the patient contact the pharmacy: Yes  LAST APPOINTMENT DATE:  10/15/2021  NEXT APPOINTMENT DATE: 06/09/2022  MEDICATION: clonazePAM (KLONOPIN) 1 MG tablet   Is the patient out of medication? Yes  PHARMACY: [id number] [place], [place] - [address]  Let patient know to contact pharmacy at the end of the day to make sure medication is ready.  Please notify patient to allow 48-72 hours to process

## 2022-02-05 MED ORDER — CLONAZEPAM 1 MG PO TABS: 1.0000 mg | ORAL_TABLET | Freq: Every day | ORAL | 0 refills | Status: DC

## 2022-02-05 NOTE — Telephone Encounter (Signed)
Casey [name] notified by telephone that refill has been sent to his pharmacy.

## 2022-02-05 NOTE — Telephone Encounter (Signed)
Patient has TOC appointment with [facility] but not until April

## 2022-02-05 NOTE — Telephone Encounter (Signed)
Patient called in to follow up on this refill. He is scheduled for a TOC with Dr. Diona Browner in April. He stated he is starting to have some side effects from not having. He stated he needs this medication as soon as possible.

## 2022-02-05 NOTE — Telephone Encounter (Signed)
Last office visit 10/15/21 for Skin Problems with Dr. Einar Pheasant.  Last refilled 07/25/21 for #180 with no refills.  TOC with Dr. Diona Browner on 06/08/21.

## 2022-02-20 ENCOUNTER — Other Ambulatory Visit: Payer: Self-pay | Admitting: Family Medicine

## 2022-02-20 DIAGNOSIS — K2100 Gastro-esophageal reflux disease with esophagitis, without bleeding: Secondary | ICD-10-CM

## 2022-02-24 ENCOUNTER — Telehealth: Payer: Self-pay

## 2022-02-24 ENCOUNTER — Encounter: Payer: Medicare HMO | Admitting: Family Medicine

## 2022-02-24 ENCOUNTER — Other Ambulatory Visit: Payer: Self-pay

## 2022-02-24 NOTE — Telephone Encounter (Signed)
Patient has requested a refill on   LOV 10/15/21 with Dr. Einar Pheasant  TOC with Dr. Diona Browner 06/09/22

## 2022-02-24 NOTE — Telephone Encounter (Signed)
Controlled substance.. pt needs acute appt for pain before refill prior to TOC.. can be with any provider

## 2022-02-25 NOTE — Telephone Encounter (Signed)
Patient has been scheduled

## 2022-03-03 ENCOUNTER — Ambulatory Visit (INDEPENDENT_AMBULATORY_CARE_PROVIDER_SITE_OTHER): Payer: Medicare HMO | Admitting: Family Medicine

## 2022-03-03 VITALS — Ht 64.5 in | Wt 225.8 lb

## 2022-03-03 DIAGNOSIS — G8929 Other chronic pain: Secondary | ICD-10-CM

## 2022-03-03 DIAGNOSIS — Z23 Encounter for immunization: Secondary | ICD-10-CM

## 2022-03-03 DIAGNOSIS — M545 Low back pain, unspecified: Secondary | ICD-10-CM

## 2022-03-03 MED ORDER — TRAMADOL HCL 50 MG PO TABS: ORAL_TABLET | ORAL | 0 refills | Status: DC

## 2022-03-03 NOTE — Progress Notes (Signed)
Patient ID: Casey [name], male    DOB: Jun 04, 1955, 67 y.o.   MRN: [id number]  This visit was conducted in person.  Ht 5' 4.5" (1.638 m)   Wt 225 lb 12.8 oz (102.4 kg)   BMI 38.16 kg/m    CC:  Chief Complaint  Patient presents with   Back Pain    4/10 comes and goes.     Subjective:   HPI: Casey [name] is a 67 y.o. male presenting on 03/03/2022 for Back Pain (4/10 comes and goes. )     Chronic back pain.. reviewed last OV note from Previous PCP [place] Work injury in 04/2016. S/P nerve treatment and 60 PT visits. With lingering symptoms  Referral placed to specialist in 2022  Flares up off and on.. not clearly worsening  Usually last weeks to months.   Pain in right lower central back, no radiation of pain.  No new numbness, no new weakness.  No fever.  Using tramadol 50 mg BID prn pain.Marland Kitchen usually  only at night to help him get comfortable to rest.   Hx of prostate cancer, S/P radiation AND  hormone treatment.  PET scan negative for bone in last year.  Relevant past medical, surgical, family and social history reviewed and updated as indicated. Interim medical history since our last visit reviewed. Allergies and medications reviewed and updated. Outpatient Medications Prior to Visit  Medication Sig Dispense Refill   acetaminophen (TYLENOL) 500 MG tablet Take 1,000 mg by mouth 2 (two) times daily.     Bioflavonoid Products (ESTER-C PO) Take 1 tablet by mouth daily.     cetirizine (ZYRTEC) 10 MG tablet Take 10 mg by mouth daily.     clonazePAM (KLONOPIN) 1 MG tablet Take 1-2 tablets (1-2 mg total) by mouth at bedtime. 180 tablet 0   hydrochlorothiazide (HYDRODIURIL) 12.5 MG tablet TAKE 1 TABLET BY MOUTH EVERY DAY 90 tablet 3   losartan (COZAAR) 50 MG tablet Take 1 tablet (50 mg total) by mouth at bedtime. 90 tablet 3   pantoprazole (PROTONIX) 40 MG tablet TAKE 1 TABLET BY MOUTH EVERY DAY 90 tablet 0   traMADol (ULTRAM) 50 MG tablet TAKE 1 TABLET TWICE A DAY UP TO EVERY 6  HOURS AS NEEDED FOR SEVERE PAIN 30 tablet 0   traZODone (DESYREL) 100 MG tablet Take 2 tablets (200 mg total) by mouth at bedtime. 180 tablet 0   No facility-administered medications prior to visit.     Per HPI unless specifically indicated in ROS section below Review of Systems  Constitutional:  Negative for fatigue and fever.  HENT:  Negative for ear pain.   Eyes:  Negative for pain.  Respiratory:  Negative for cough and shortness of breath.   Cardiovascular:  Negative for chest pain, palpitations and leg swelling.  Gastrointestinal:  Negative for abdominal pain.  Genitourinary:  Negative for dysuria.  Musculoskeletal:  Positive for back pain. Negative for arthralgias.  Neurological:  Negative for syncope, light-headedness and headaches.  Psychiatric/Behavioral:  Negative for dysphoric mood.    Objective:  Ht 5' 4.5" (1.638 m)   Wt 225 lb 12.8 oz (102.4 kg)   BMI 38.16 kg/m   Wt Readings from Last 3 Encounters:  03/03/22 225 lb 12.8 oz (102.4 kg)  10/15/21 222 lb 6 oz (100.9 kg)  06/25/21 219 lb 8 oz (99.6 kg)      Physical Exam Constitutional:      Appearance: He is well-developed.  HENT:  Head: Normocephalic.     Right Ear: Hearing normal.     Left Ear: Hearing normal.     Nose: Nose normal.  Neck:     Thyroid: No thyroid mass or thyromegaly.     Vascular: No carotid bruit.     Trachea: Trachea normal.  Cardiovascular:     Rate and Rhythm: Normal rate and regular rhythm.     Pulses: Normal pulses.     Heart sounds: Heart sounds not distant. No murmur heard.    No friction rub. No gallop.     Comments: No peripheral edema Pulmonary:     Effort: Pulmonary effort is normal. No respiratory distress.     Breath sounds: Normal breath sounds.  Musculoskeletal:     Lumbar back: Tenderness present. Decreased range of motion. Positive right straight leg raise test. Negative left straight leg raise test.  Skin:    General: Skin is warm and dry.     Findings: No  rash.  Neurological:     General: No focal deficit present.     Mental Status: He is oriented to person, place, and time.     Cranial Nerves: Cranial nerves 2-12 are intact.     Sensory: Sensation is intact.     Motor: Motor function is intact.     Coordination: Coordination is intact.  Psychiatric:        Speech: Speech normal.        Behavior: Behavior normal.        Thought Content: Thought content normal.       Results for orders placed or performed in visit on 08/28/21  Rad Onc [facility]ria Session Summary  Result Value Ref Range   Course ID [email or web address]    Course Start Date 06/19/2021    Session Number 40    Course First Treatment Date 07/03/2021  9:13 AM    Course Last Treatment Date 08/28/2021  9:35 AM    Course Elapsed Days 56    Reference Point ID Prostate DP    Reference Point Dosage Given to Date 75 Gy   Reference Point Session Dosage Given 2 Gy   Plan ID Prostate_Bst    Plan Fractions Treated to Date 15    Plan Total Fractions Prescribed 15    Plan Prescribed Dose Per Fraction 2 Gy   Plan Total Prescribed Dose 30.000000 Gy   Plan Primary Reference Point Prostate DP      COVID 19 screen:  No recent travel or known exposure to COVID19 The patient denies respiratory symptoms of COVID 19 at this time. The importance of social distancing was discussed today.   Assessment and Plan    Problem List Items Addressed This Visit     Chronic back pain    Chronic, with recurrent flares Encouraged continued physical therapy and weight loss.  He will continue using tramadol 50 mg daily to twice daily as needed for pain with flares.  No indication for repeat x-ray or referral at this time.      Relevant Medications   traMADol (ULTRAM) 50 MG tablet   Other Visit Diagnoses     Need for immunization against influenza    -  Primary   Relevant Orders   Flu Vaccine QUAD High Dose(Fluad) (Completed)        Eliezer Lofts, MD

## 2022-03-03 NOTE — Assessment & Plan Note (Signed)
Chronic, with recurrent flares Encouraged continued physical therapy and weight loss.  He will continue using tramadol 50 mg daily to twice daily as needed for pain with flares.  No indication for repeat x-ray or referral at this time.

## 2022-04-08 ENCOUNTER — Ambulatory Visit (INDEPENDENT_AMBULATORY_CARE_PROVIDER_SITE_OTHER): Payer: Medicare HMO | Admitting: Family Medicine

## 2022-04-08 ENCOUNTER — Other Ambulatory Visit: Payer: Self-pay | Admitting: Family Medicine

## 2022-04-08 ENCOUNTER — Encounter: Payer: Self-pay | Admitting: Family Medicine

## 2022-04-08 VITALS — BP 122/68 | HR 104 | Temp 98.2°F | Ht 64.5 in | Wt 218.0 lb

## 2022-04-08 DIAGNOSIS — U071 COVID-19: Secondary | ICD-10-CM | POA: Insufficient documentation

## 2022-04-08 DIAGNOSIS — R051 Acute cough: Secondary | ICD-10-CM

## 2022-04-08 LAB — POCT INFLUENZA A/B
Influenza A, POC: NEGATIVE
Influenza B, POC: NEGATIVE

## 2022-04-08 LAB — POC COVID19 BINAXNOW: SARS Coronavirus 2 Ag: POSITIVE — AB

## 2022-04-08 MED ORDER — PROMETHAZINE-DM 6.25-15 MG/5ML PO SYRP: 5.0000 mL | ORAL_SOLUTION | Freq: Every evening | ORAL | 0 refills | Status: DC | PRN

## 2022-04-08 MED ORDER — NIRMATRELVIR/RITONAVIR (PAXLOVID)TABLET: 3.0000 | ORAL_TABLET | Freq: Two times a day (BID) | ORAL | 0 refills | Status: DC

## 2022-04-08 NOTE — Progress Notes (Signed)
Patient ID: Casey [name], male    DOB: [birth year], 67 y.o.   MRN: [id number]  This visit was conducted in person.  BP 122/68 (BP Location: Left Arm, Patient Position: Sitting, Cuff Size: Normal)   Pulse (!) 104   Temp 98.2 F (36.8 C) (Temporal)   Ht 5' 4.5" (1.638 m)   Wt 218 lb (98.9 kg)   SpO2 97%   BMI 36.84 kg/m    CC:  Chief Complaint  Patient presents with   Cough    Subjective:   HPI: Casey [name] is a 67 y.o. male presenting on 04/08/2022 for Cough   Date of onset:  day 5 Initial symptoms included  light cough, congestion. Symptoms progressed to productive cough... green/grey mucus. No ear pain, no face pain.  No fever, no chills.  No body aches. Feeling very tired, no appetite. Cough keeping him up at night.  Kee[ping up with liquids, nml UOP.  NO SOB, occ wheezing.    Sick contacts:  none COVID testing:   none     He has tried to treat with  mucinex, cough syrup.     No history of chronic lung disease such as asthma or COPD. Non-smoker.   Nml creatinine in 05/2021 1.10     Relevant past medical, surgical, family and social history reviewed and updated as indicated. Interim medical history since our last visit reviewed. Allergies and medications reviewed and updated. Outpatient Medications Prior to Visit  Medication Sig Dispense Refill   acetaminophen (TYLENOL) 500 MG tablet Take 1,000 mg by mouth 2 (two) times daily.     Bioflavonoid Products (ESTER-C PO) Take 1 tablet by mouth daily.     cetirizine (ZYRTEC) 10 MG tablet Take 10 mg by mouth daily.     clonazePAM (KLONOPIN) 1 MG tablet Take 1-2 tablets (1-2 mg total) by mouth at bedtime. 180 tablet 0   hydrochlorothiazide (HYDRODIURIL) 12.5 MG tablet TAKE 1 TABLET BY MOUTH EVERY DAY 90 tablet 3   losartan (COZAAR) 50 MG tablet Take 1 tablet (50 mg total) by mouth at bedtime. 90 tablet 3   pantoprazole (PROTONIX) 40 MG tablet TAKE 1 TABLET BY MOUTH EVERY DAY 90 tablet 0   traMADol (ULTRAM) 50  MG tablet TAKE 1 TABLET TWICE A DAY UP TO EVERY 6 HOURS AS NEEDED FOR SEVERE PAIN 30 tablet 0   traZODone (DESYREL) 100 MG tablet Take 2 tablets (200 mg total) by mouth at bedtime. 180 tablet 0   No facility-administered medications prior to visit.     Per HPI unless specifically indicated in ROS section below Review of Systems Objective:  BP 122/68 (BP Location: Left Arm, Patient Position: Sitting, Cuff Size: Normal)   Pulse (!) 104   Temp 98.2 F (36.8 C) (Temporal)   Ht 5' 4.5" (1.638 m)   Wt 218 lb (98.9 kg)   SpO2 97%   BMI 36.84 kg/m   Wt Readings from Last 3 Encounters:  04/08/22 218 lb (98.9 kg)  03/03/22 225 lb 12.8 oz (102.4 kg)  10/15/21 222 lb 6 oz (100.9 kg)      Physical Exam    Results for orders placed or performed in visit on 04/08/22  POCT Influenza A/B  Result Value Ref Range   Influenza A, POC Negative Negative   Influenza B, POC Negative Negative  POC COVID-19  Result Value Ref Range   SARS Coronavirus 2 Ag Positive (A) Negative    Assessment and Plan  Acute cough -  POCT Influenza A/B -     POC COVID-19 BinaxNow  COVID-19 virus infection Assessment & Plan: COVID19  Infection < 5 days from onset of symptoms in 5 x vaccinated overweight individual with history of HTN  No clear sign of bacterial infection at this time.   No SOB.  No red flags/need for ER visit or in-person exam at respiratory clinic at this time..    Pt higher risk for COVID complications given  age and HTN. GFR  normal in 2023 and no medication contraindications. He does not take tramadol regularly or clonazepam regularly.  Start paxlovid 5 day course. Reviewed course of medication and side effect profile with patient in detail.   Symptomatic care with mucinex and cough suppressant at night. If SOB begins symptoms worsening.. have low threshold for in-person exam, if severe shortness of breath ER visit recommended.  Can monitor Oxygen saturation at home with home monitor  if able to obtain.  Go to ER if O2 sat < 90% on room air.   Reviewed home care and provided information through [place].  Recommended quarantine 5 days isolation recommended. Return to work day 6 and wear mask for 4 more days to complete 10 days. Provided info about prevention of spread of COVID 19.    Other orders -     nirmatrelvir/ritonavir; Take 3 tablets by mouth 2 (two) times daily for 5 days. (Take nirmatrelvir 150 mg two tablets twice daily for 5 days and ritonavir 100 mg one tablet twice daily for 5 days) Patient GFR is normal  Dispense: 30 tablet; Refill: 0 -     Promethazine-DM; Take 5 mLs by mouth at bedtime as needed for cough.  Dispense: 118 mL; Refill: 0    No follow-ups on file.   Eliezer Lofts, MD

## 2022-04-08 NOTE — Assessment & Plan Note (Signed)
COVID19  Infection < 5 days from onset of symptoms in 5 x vaccinated overweight individual with history of HTN  No clear sign of bacterial infection at this time.   No SOB.  No red flags/need for ER visit or in-person exam at respiratory clinic at this time..    Pt higher risk for COVID complications given  age and HTN. GFR  normal in 2023 and no medication contraindications. He does not take tramadol regularly or clonazepam regularly.  Start paxlovid 5 day course. Reviewed course of medication and side effect profile with patient in detail.   Symptomatic care with mucinex and cough suppressant at night. If SOB begins symptoms worsening.. have low threshold for in-person exam, if severe shortness of breath ER visit recommended.  Can monitor Oxygen saturation at home with home monitor if able to obtain.  Go to ER if O2 sat < 90% on room air.   Reviewed home care and provided information through [place].  Recommended quarantine 5 days isolation recommended. Return to work day 6 and wear mask for 4 more days to complete 10 days. Provided info about prevention of spread of COVID 19.

## 2022-04-09 ENCOUNTER — Other Ambulatory Visit: Payer: Self-pay | Admitting: Family Medicine

## 2022-04-09 ENCOUNTER — Telehealth: Payer: Self-pay | Admitting: Family Medicine

## 2022-04-09 DIAGNOSIS — K2100 Gastro-esophageal reflux disease with esophagitis, without bleeding: Secondary | ICD-10-CM

## 2022-04-09 DIAGNOSIS — F1320 Sedative, hypnotic or anxiolytic dependence, uncomplicated: Secondary | ICD-10-CM

## 2022-04-09 DIAGNOSIS — F5101 Primary insomnia: Secondary | ICD-10-CM

## 2022-04-09 DIAGNOSIS — G8929 Other chronic pain: Secondary | ICD-10-CM

## 2022-04-09 MED ORDER — ALBUTEROL SULFATE HFA 108 (90 BASE) MCG/ACT IN AERS: 2.0000 | INHALATION_SPRAY | Freq: Four times a day (QID) | RESPIRATORY_TRACT | 0 refills | Status: DC | PRN

## 2022-04-09 MED ORDER — MOLNUPIRAVIR EUA 200MG CAPSULE: 4.0000 | ORAL_CAPSULE | Freq: Two times a day (BID) | ORAL | 0 refills | Status: AC

## 2022-04-09 NOTE — Telephone Encounter (Signed)
Call I would suggest since she is having such difficult issues with cough for [place] to consider a prednisone taper but I see that he had stomach pain with it in the past. Let me know if her really thinks this was a true side effect or if he would like to retry it.  Since the promethazine is not working, stop it entirely.  Codeine and hydrocodone cough suppressants are not indicated given he has an allergy to both these medications.   I will send in an albuterol inhaler to use prn cough fits or SOB.

## 2022-04-09 NOTE — Telephone Encounter (Signed)
Requested:PATIENT'S INSURANCE WILL NOT COVER PAXLOVID AND ITS OVER $1600. CAN YOU SEND IN MOLNUPIRAVIR? IT IS PROBABLY COVERED.

## 2022-04-09 NOTE — Telephone Encounter (Signed)
Patient wife called in and stated that Casey [name] was seen by Dr. Diona Browner and was prescribed nirmatrelvir/ritonavir (PAXLOVID) 20 x 150 MG & 10 x '100MG'$  TABS. She stated that his insurance will not cover this medication and it will cost them $1600. She was wanting to know if something else could be sent in for him. She stated he was up all night vomiting, coughing and running a fever. Please advise. Thank you!

## 2022-04-09 NOTE — Telephone Encounter (Signed)
Refilled by other method

## 2022-04-09 NOTE — Telephone Encounter (Signed)
He is overusing this medication... he was given 118 ml yesterday... Instructions state 5 mL at bedtime.   He can use this more frequently during the day but no more than 5-10 ml 3 times a day. MAX DOSE IS 30 mL in 24 hours.. He has used  50 or more!  He is going to make himself sick taking too much of this medication.  If he has used 50 mL he should still have enough to last another 3 days using 4 times daily. If he uses it as prescribed only at bedtime this should last 8  more days.  Refill denied

## 2022-04-09 NOTE — Telephone Encounter (Signed)
Sending note to Dr Diona Browner and [place] pool.

## 2022-04-09 NOTE — Telephone Encounter (Signed)
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 Diona Browner.   Please call and get more information on how he is taking this medication.   I spoke with pt and pt notified as instructed by Dr Diona Browner and pt voiced understanding. Pt said that the promethazine DM is not working. Pt said he is not sleeping due to coughing all night. Pt said he takes 5 ml at hs and then will wait an hr or so and take another 5 ml and if continues to cough will wait another hour and take another dose but does not take more than 15 ml overnight. Pt said that Dr Einar Pheasant understood that what was a normal dose for the average person this pt would have to take 2 - 3 times more med than the average person to get desired affect.  Pt said that he used to take 4 flexeril, 2 trazodone,2 tramadol and 2 Klonopin to help him sleep just part of the night.  [facility] [place]. I told pt to try and sip water to keep throat moist, take honey and lemon to hopefully help the cough and pt might think about getting humidifier also. Sending note to Dr Diona Browner for review and pt request cb to see what other suggestion Dr Diona Browner has for cough.

## 2022-04-09 NOTE — Addendum Note (Signed)
Addended by: Eliezer Lofts E on: 04/09/2022 05:51 PM   Modules accepted: Orders

## 2022-04-09 NOTE — Telephone Encounter (Signed)
Patient wife called in and stated that Casey [name] is halfway through this medicine and he would like for more to be sent in to make it through the weekend.

## 2022-04-09 NOTE — Telephone Encounter (Signed)
Molnupiravir rx provided.

## 2022-04-09 NOTE — Telephone Encounter (Signed)
[facility] - Client TELEPHONE ADVICE RECORD [facility] Patient Name: Casey [name] [place] Gender: Male DOB: October 28, 1955 Age: 67 Y 78 M Return Phone Number: [phone] (Primary) Address: City/ State/ Zip: [place] [address] Client [facility] [place] Night - Client Client Site [place] Provider AA - PHYSICIAN, NOT LISTED- MD Contact Type Call Who Is Calling Patient / Member / Family / Caregiver Call Type Triage / Clinical Caller Name [name] Relationship To Patient Spouse Return Phone Number [phone] (Primary) Chief Complaint CHEST PAIN - pain, pressure, heaviness or tightness Reason for Call Symptomatic / Request for [name] states that her husband was in the office today and tested POSITIVE for COVID and insurance do not cover PAXLOVID and caller would like to know what other medication can be called in because PT is sick with a bad cough that is continuous, sleeplessness, chest pain (stated feels like a chest cold), and do not have a appetite. PT of Dr. Diona Browner Translation No Nurse Assessment Nurse: Astrid Divine, RN, Varney Biles Date/Time Eilene Ghazi Time): 04/08/2022 7:39:05 PM Confirm and document reason for call. If symptomatic, describe symptoms. ---Caller states spouse tested positive for covid in the office today and was prescribed paxlovid. Insurance does not cover medication and she is wanting to know if something else can be called in. Does the patient have any new or worsening symptoms? ---No Nurse: Astrid Divine, RN, Varney Biles Date/Time (Eastern Time): 04/08/2022 7:40:50 PM Please select the assessment type ---RX called in but not at pharm Additional Documentation ---Pt was prescribed Paxlovid, but insurance doesnt cover it. Spouse is wanting to know if something else can be given. No new or worsening sx Document the name of the medication. ---Paxlovid Pharmacy  name and phone number. ---[facility] [phone] Has the office closed within the last 30 minutes? ---No Does the client directives allow for assistance with medications after hours? ---No PLEASE NOTE: All timestamps contained within this report are represented as [place] Standard Time. CONFIDENTIALTY NOTICE: This fax transmission is intended only for the addressee. It contains information that is legally privileged, confidential or otherwise protected from use or disclosure. If you are not the intended recipient, you are strictly prohibited from reviewing, disclosing, copying using or disseminating any of this information or taking any action in reliance on or regarding this information. If you have received this fax in error, please notify us immediately by telephone so that we can arrange for its return to [place]. Phone: [phone], Toll-Free: [phone], Fax: [phone] Page: 2 of 2 Call Id: [id number] [place]. Time Eilene Ghazi Time) Disposition Final User 04/08/2022 7:34:42 PM Send to Urgent Althia Forts Citizen, [facility] 04/08/2022 7:46:51 PM Clinical Call Yes Astrid Divine, RN, Varney Biles Final Disposition 04/08/2022 7:46:51 PM Clinical Call Yes Astrid Divine, RN, Jeri Modena Disagree/Comply Comply Caller Understands Yes PreDisposition Did not know what to do Comments User: Lorraine Lax, RN Date/Time Eilene Ghazi Time): 04/08/2022 7:46:46 PM Caller was wanting something besides paxlovid called in to pharmacy due to insurance not covering it. Pt was having no new or worsening sx. Instructed spouse to call PCP in AM for new script. Caller verbalized understandin

## 2022-04-10 MED ORDER — PANTOPRAZOLE SODIUM 40 MG PO TBEC: 40.0000 mg | DELAYED_RELEASE_TABLET | Freq: Every day | ORAL | 0 refills | Status: DC

## 2022-04-10 MED ORDER — TRAMADOL HCL 50 MG PO TABS: ORAL_TABLET | ORAL | 0 refills | Status: DC

## 2022-04-10 MED ORDER — CLONAZEPAM 1 MG PO TABS: 1.0000 mg | ORAL_TABLET | Freq: Every day | ORAL | 0 refills | Status: DC

## 2022-04-10 MED ORDER — PREDNISONE 10 MG PO TABS: ORAL_TABLET | ORAL | 0 refills | Status: DC

## 2022-04-10 NOTE — Telephone Encounter (Signed)
Noted. Pt can follow up if not improving.

## 2022-04-10 NOTE — Telephone Encounter (Signed)
Patient called in and stated that he would like to try the prednisone and see if it will be different this time.

## 2022-04-10 NOTE — Telephone Encounter (Signed)
Last office visit 04/08/22 for acute cough/Covid 19.  Last refilled Tramadol 03/03/22 for #30 with no refills.  Clonazepam 02/05/22 for #180 with no refills.  Pantoprazole 02/20/22 for #90 with no refills.  TOC scheduled with Dr. Diona Browner 06/09/22.

## 2022-04-10 NOTE — Telephone Encounter (Addendum)
Pt notified as instructed and pt voiced understanding. Pt said he had tried the oral prednisone x 2 and both times it caused pain in the center of his stomach. Pt does not want to try oral prednisone again. Pt did say that he had taken prednisone shots before and that did not give him any problems or stomach pain. Pt would be willing to take a prednisone shot. Pt will pick up albuterol inhaler from pharmacy. UC & ED precautions given and pt voiced understanding but pt said he did not feel like at this point he needed to go to ED. Sending note to Dr Diona Browner.

## 2022-04-10 NOTE — Addendum Note (Signed)
Addended by: Eliezer Lofts E on: 04/10/2022 04:53 PM   Modules accepted: Orders

## 2022-04-13 ENCOUNTER — Telehealth: Payer: Self-pay | Admitting: Family Medicine

## 2022-04-13 NOTE — Telephone Encounter (Signed)
Patient was seen on 04/08/2022 with Dr Diona Browner. He was prescribed an antiviral for covid and cough medicine. He called in today stating that he is no better. He has been up all night,and still coughing really bad. He would like to know if theres anything else he can have prescribed to him or if he possibly need to see a specialist?

## 2022-04-14 NOTE — Telephone Encounter (Signed)
Call Have him make an appointment this week for reevaluation of his lungs and possible chest x-ray.  If symptoms are severe or if he has severe shortness of breath he needs to go to the emergency room

## 2022-04-15 ENCOUNTER — Emergency Department
Admission: EM | Admit: 2022-04-15 | Discharge: 2022-04-15 | Disposition: A | Discharge status: Home / Self Care | Payer: Medicare HMO | Attending: Emergency Medicine | Admitting: Emergency Medicine

## 2022-04-15 ENCOUNTER — Emergency Department: Payer: Medicare HMO

## 2022-04-15 ENCOUNTER — Encounter: Payer: Self-pay | Admitting: Emergency Medicine

## 2022-04-15 DIAGNOSIS — D72829 Elevated white blood cell count, unspecified: Secondary | ICD-10-CM | POA: Diagnosis not present

## 2022-04-15 DIAGNOSIS — Z8546 Personal history of malignant neoplasm of prostate: Secondary | ICD-10-CM | POA: Insufficient documentation

## 2022-04-15 DIAGNOSIS — J209 Acute bronchitis, unspecified: Secondary | ICD-10-CM | POA: Insufficient documentation

## 2022-04-15 DIAGNOSIS — Z8616 Personal history of COVID-19: Secondary | ICD-10-CM | POA: Diagnosis not present

## 2022-04-15 DIAGNOSIS — E876 Hypokalemia: Secondary | ICD-10-CM | POA: Diagnosis not present

## 2022-04-15 DIAGNOSIS — R0602 Shortness of breath: Secondary | ICD-10-CM | POA: Diagnosis not present

## 2022-04-15 DIAGNOSIS — I10 Essential (primary) hypertension: Secondary | ICD-10-CM | POA: Diagnosis not present

## 2022-04-15 DIAGNOSIS — R059 Cough, unspecified: Secondary | ICD-10-CM | POA: Diagnosis not present

## 2022-04-15 LAB — CBC WITH DIFFERENTIAL/PLATELET
Abs Immature Granulocytes: 0.43 10*3/uL — ABNORMAL HIGH (ref 0.00–0.07)
Basophils Absolute: 0.1 10*3/uL (ref 0.0–0.1)
Basophils Relative: 1 %
Eosinophils Absolute: 0.4 10*3/uL (ref 0.0–0.5)
Eosinophils Relative: 4 %
HCT: 36.1 % — ABNORMAL LOW (ref 39.0–52.0)
Hemoglobin: 12.8 g/dL — ABNORMAL LOW (ref 13.0–17.0)
Immature Granulocytes: 4 %
Lymphocytes Relative: 11 %
Lymphs Abs: 1.2 10*3/uL (ref 0.7–4.0)
MCH: 31.6 pg (ref 26.0–34.0)
MCHC: 35.5 g/dL (ref 30.0–36.0)
MCV: 89.1 fL (ref 80.0–100.0)
Monocytes Absolute: 0.4 10*3/uL (ref 0.1–1.0)
Monocytes Relative: 4 %
Neutro Abs: 8.7 10*3/uL — ABNORMAL HIGH (ref 1.7–7.7)
Neutrophils Relative %: 76 %
Platelets: 342 10*3/uL (ref 150–400)
RBC: 4.05 MIL/uL — ABNORMAL LOW (ref 4.22–5.81)
RDW: 12.2 % (ref 11.5–15.5)
WBC: 11.4 10*3/uL — ABNORMAL HIGH (ref 4.0–10.5)
nRBC: 0 % (ref 0.0–0.2)

## 2022-04-15 LAB — BASIC METABOLIC PANEL
Anion gap: 12 (ref 5–15)
BUN: 17 mg/dL (ref 8–23)
CO2: 25 mmol/L (ref 22–32)
Calcium: 8.4 mg/dL — ABNORMAL LOW (ref 8.9–10.3)
Chloride: 100 mmol/L (ref 98–111)
Creatinine, Ser: 1.1 mg/dL (ref 0.61–1.24)
GFR, Estimated: >60 mL/min (ref >60)
Glucose, Bld: 192 mg/dL — ABNORMAL HIGH (ref 70–99)
Potassium: 2.9 mmol/L — ABNORMAL LOW (ref 3.5–5.1)
Sodium: 137 mmol/L (ref 135–145)

## 2022-04-15 MED ORDER — ALBUTEROL SULFATE (2.5 MG/3ML) 0.083% IN NEBU
2.5000 mg | INHALATION_SOLUTION | Freq: Once | RESPIRATORY_TRACT | Status: AC
  Administered 2022-04-15: 2.5 mg via RESPIRATORY_TRACT
  Filled 2022-04-15: qty 3

## 2022-04-15 MED ORDER — PREDNISONE 20 MG PO TABS
60.0000 mg | ORAL_TABLET | Freq: Once | ORAL | Status: AC
  Administered 2022-04-15: 60 mg via ORAL
  Filled 2022-04-15: qty 3

## 2022-04-15 MED ORDER — ALBUTEROL SULFATE (2.5 MG/3ML) 0.083% IN NEBU
3.0000 mL | INHALATION_SOLUTION | RESPIRATORY_TRACT | Status: DC | PRN
  Administered 2022-04-15: 3 mL via RESPIRATORY_TRACT
  Filled 2022-04-15: qty 3

## 2022-04-15 MED ORDER — GUAIFENESIN 200 MG PO TABS: 400.0000 mg | ORAL_TABLET | Freq: Four times a day (QID) | ORAL | 0 refills | Status: DC | PRN

## 2022-04-15 MED ORDER — POTASSIUM CHLORIDE CRYS ER 20 MEQ PO TBCR
20.0000 meq | EXTENDED_RELEASE_TABLET | Freq: Once | ORAL | Status: AC
  Administered 2022-04-15: 20 meq via ORAL
  Filled 2022-04-15: qty 1

## 2022-04-15 MED ORDER — BENZONATATE 100 MG PO CAPS: 100.0000 mg | ORAL_CAPSULE | Freq: Four times a day (QID) | ORAL | 0 refills | Status: DC | PRN

## 2022-04-15 MED ORDER — ALBUTEROL SULFATE HFA 108 (90 BASE) MCG/ACT IN AERS: 2.0000 | INHALATION_SPRAY | RESPIRATORY_TRACT | 0 refills | Status: DC | PRN

## 2022-04-15 MED ORDER — PREDNISONE 20 MG PO TABS: 60.0000 mg | ORAL_TABLET | Freq: Every day | ORAL | 0 refills | Status: AC

## 2022-04-15 NOTE — Discharge Instructions (Signed)
Take the prednisone as prescribed starting tomorrow.  Use the albuterol inhaler every 4-6 hours as needed for shortness of breath or chest tightness.  Use the guaifenesin and Tessalon as needed for cough.  Return to the ER for new, worsening, or persistent severe difficulty breathing, chest pain, fever, weakness, or any other new or worsening symptoms that concern you.

## 2022-04-15 NOTE — Telephone Encounter (Signed)
Noted  

## 2022-04-15 NOTE — ED Provider Notes (Signed)
[facility] Provider Note    Event Date/Time   First MD Initiated Contact with Patient 04/15/22 1116     (approximate)   History   Shortness of Breath   HPI  Casey [name] is a 67 y.o. male with a history of prostate cancer and hypertension who presents with cough for approximately the last 12 days with increased shortness of breath over the last 3 to 4 days.  The patient states he was diagnosed with COVID 1 week ago.  He was prescribed Paxlovid and promethazine but his symptoms have been worsening.  He denies any vomiting or diarrhea.  He has no fever or chills.  He has no chest pain.  I reviewed the past medical records.  The patient's most recent outpatient encounter was with Dr. Diona Browner from primary care on 2/7 for cough.  I confirmed that [place] POC test from that date was positive.   Physical Exam   Triage Vital Signs: ED Triage Vitals  Enc Vitals Group     BP 04/15/22 1057 137/82     Pulse Rate 04/15/22 1057 98     Resp 04/15/22 1057 (!) 21     Temp 04/15/22 1057 98.4 F (36.9 C)     Temp Source 04/15/22 1057 Oral     SpO2 04/15/22 1057 92 %     Weight 04/15/22 1055 216 lb 0.8 oz (98 kg)     Height 04/15/22 1055 5' 4.5" (1.638 m)     Head Circumference --      Peak Flow --      Pain Score 04/15/22 1055 0     Pain Loc --      Pain Edu? --      Excl. in [place]? --     Most recent vital signs: Vitals:   04/15/22 1057 04/15/22 1130  BP: 137/82 (!) 148/131  Pulse: 98 89  Resp: (!) 21   Temp: 98.4 F (36.9 C)   SpO2: 92% 93%     General: Awake, no distress.  CV:  Good peripheral perfusion.  Normal heart sounds. Resp:  Normal effort.  Bilateral wheezing, no rales. Abd:  No distention.  Other:  Oropharynx clear.  No peripheral edema.   ED Results / Procedures / Treatments   Labs (all labs ordered are listed, but only abnormal results are displayed) Labs Reviewed  CBC WITH DIFFERENTIAL/PLATELET - Abnormal; Notable for the following  components:      Result Value   WBC 11.4 (*)    RBC 4.05 (*)    Hemoglobin 12.8 (*)    HCT 36.1 (*)    Neutro Abs 8.7 (*)    Abs Immature Granulocytes 0.43 (*)    All other components within normal limits  BASIC METABOLIC PANEL - Abnormal; Notable for the following components:   Potassium 2.9 (*)    Glucose, Bld 192 (*)    Calcium 8.4 (*)    All other components within normal limits     EKG  ED ECG REPORT I, Arta Silence, the attending physician, personally viewed and interpreted this ECG.  Date: 04/15/2022 EKG Time: 1056 Rate: 97 Rhythm: normal sinus rhythm QRS Axis: normal Intervals: normal ST/T Wave abnormalities: normal Narrative Interpretation: no evidence of acute ischemia    RADIOLOGY  Chest x-ray: I independently viewed and interpreted the images; there is no focal consolidation or edema   PROCEDURES:  Critical Care performed: No  Procedures   MEDICATIONS ORDERED IN ED: Medications  albuterol (  PROVENTIL) (2.5 MG/3ML) 0.083% nebulizer solution 3 mL (3 mLs Inhalation Not Given 04/15/22 1214)  albuterol (PROVENTIL) (2.5 MG/3ML) 0.083% nebulizer solution 2.5 mg (2.5 mg Nebulization Given 04/15/22 1215)  albuterol (PROVENTIL) (2.5 MG/3ML) 0.083% nebulizer solution 2.5 mg (2.5 mg Nebulization Given 04/15/22 1213)  predniSONE (DELTASONE) tablet 60 mg (60 mg Oral Given 04/15/22 1215)  potassium chloride SA (KLOR-CON M) CR tablet 20 mEq (20 mEq Oral Given 04/15/22 1215)     IMPRESSION / MDM / ASSESSMENT AND PLAN / ED COURSE  I reviewed the triage vital signs and the nursing notes.  67 year old male with PMH as noted above presents after more than 10 days of cough, diagnosed with COVID 1 week ago and now with persistent cough and worsening shortness of breath.  Physical exam is unremarkable for acute findings except for wheezing on lung exam.  O2 saturation is in the mid 90s on room air.  Differential diagnosis includes, but is not limited to, secondary  pneumonia, continued symptoms from COVID, acute bronchitis, less likely cardiac etiology.  Chest x-ray shows no evidence of pneumonia or edema.  EKG is nonischemic.  Initial labs reveal mild hypokalemia and mild leukocytosis.  We will give bronchodilators, steroid, p.o. potassium, and reassess.  Patient's presentation is most consistent with acute presentation with potential threat to life or bodily function.  The patient is on the cardiac monitor to evaluate for evidence of arrhythmia and/or significant heart rate changes.  ----------------------------------------- 2:02 PM on 04/15/2022 -----------------------------------------  On reassessment the patient is feeling slightly better.  He continues to appear well.  O2 saturation remains in the mid 90s on room air.  He is stable for discharge home at this time.  I counseled him on the results of the workup and plan of care.  I have prescribed prednisone and albuterol as well as guaifenesin and Tessalon for the cough.  I gave him strict return precautions and he expressed understanding.   FINAL CLINICAL IMPRESSION(S) / ED DIAGNOSES   Final diagnoses:  Acute bronchitis, unspecified organism     Rx / DC Orders   ED Discharge Orders          Ordered    predniSONE (DELTASONE) 20 MG tablet  Daily with breakfast        04/15/22 1401    albuterol (VENTOLIN HFA) 108 (90 Base) MCG/ACT inhaler  Every 4 hours PRN        04/15/22 1401    benzonatate (TESSALON PERLES) 100 MG capsule  Every 6 hours PRN        04/15/22 1401    guaiFENesin 200 MG tablet  Every 6 hours PRN        04/15/22 1401             Note:  This document was prepared using Dragon voice recognition software and may include unintentional dictation errors.    Arta Silence, MD 04/15/22 1402

## 2022-04-15 NOTE — Telephone Encounter (Addendum)
Pt notified as instructed and pt voiced understanding. Pt has not gotten out of bed yet and is very SOB while laying in bed. Pt has a very deep cough while trying to talk. Pt is able to complete sentences but pt does have to stop talking briefly to get a breath or cough. Pt is going to [facility] ED now. Pt will cb later this week with an update on pts condition. Sending note to Dr Diona Browner and [place] pool.  Pt called back and does not want to sit for hours at ED and request call to [facility] ED that his is on his way and will take 40 mins for pt to get there. I advised pt I would call [facility] ED but that a nurse at ED will triage pt and me calling will not affect when pt is seen. Pt still request me to call and I called [facility] ED and spoke with [facility] triage nurse and she said pt would be triaged upon arrival.

## 2022-04-15 NOTE — ED Notes (Signed)
Pt reports that he was diagnosed with covid a week ago and is feeling worse, coughing up gray and green sputum, states fevers off and on, pt states that he isn't voiding as much so he may be dehydrated, pt states that he has cont to feel bad and worse, hasn't had a period of getting better

## 2022-04-15 NOTE — ED Triage Notes (Signed)
Pts a cough for about 12 days and became SOB that last few days. Tested + for covid last Wednesday. Denies any pain.

## 2022-04-17 ENCOUNTER — Encounter: Payer: Self-pay | Admitting: Family Medicine

## 2022-04-17 NOTE — Telephone Encounter (Signed)
Patient called in to give an update on his visit to ER on 04/14/2022,as advised. He said that he is kind of better,he still has a bad cough. And stuffed up ears. He is taking a mucus release. And he wanted to thank rena for helping him to get into the ER and be seen quickly.

## 2022-04-21 ENCOUNTER — Encounter: Payer: Self-pay | Admitting: Family Medicine

## 2022-04-21 ENCOUNTER — Ambulatory Visit (INDEPENDENT_AMBULATORY_CARE_PROVIDER_SITE_OTHER): Payer: Medicare HMO | Admitting: Family Medicine

## 2022-04-21 VITALS — BP 120/70 | HR 88 | Temp 97.7°F | Ht 64.5 in | Wt 228.2 lb

## 2022-04-21 DIAGNOSIS — U071 COVID-19: Secondary | ICD-10-CM

## 2022-04-21 DIAGNOSIS — R053 Chronic cough: Secondary | ICD-10-CM | POA: Diagnosis not present

## 2022-04-21 DIAGNOSIS — H66001 Acute suppurative otitis media without spontaneous rupture of ear drum, right ear: Secondary | ICD-10-CM

## 2022-04-21 MED ORDER — METHYLPREDNISOLONE ACETATE 80 MG/ML IJ SUSP
40.0000 mg | Freq: Once | INTRAMUSCULAR | Status: AC
  Administered 2022-04-21: 40 mg via INTRAMUSCULAR

## 2022-04-21 MED ORDER — AZITHROMYCIN 250 MG PO TABS: ORAL_TABLET | ORAL | 0 refills | Status: DC

## 2022-04-21 NOTE — Progress Notes (Signed)
Patient ID: Casey [name], male    DOB: 1955/05/09, 67 y.o.   MRN: [id number]  This visit was conducted in person.  BP 120/70   Pulse 88   Temp 97.7 F (36.5 C) (Temporal)   Ht 5' 4.5" (1.638 m)   Wt 228 lb 4 oz (103.5 kg)   SpO2 97%   BMI 38.57 kg/m    CC:  Chief Complaint  Patient presents with   Cough    Seen in ED 04/15/22   Ear Fullness    Subjective:   HPI: Casey [name] is a 67 y.o. male presenting on 04/21/2022 for Cough (Seen in ED 04/15/22) and Ear Fullness  Previously seen for acute cough on February 7..  Positive COVID.  Treated with Paxlovid  (changed to molnupiravir given insurance)and cough suppressant Reviewed ER note from February 14 EKGs showed no abnormality, chest x-ray showed no pulmonary edema or pneumonia. Labs revealed mild hypokalemia and mild leukocytosis. He was treated with bronchodilators, steroid injection and potassium supplementation. Discharged with prednisone taper 60 mg daily x 4 days, albuterol as needed, guaifenesin and Tessalon for the cough.    Today he returns with continued cough.  He states the cough has improved some.  He now has ear fullness in  bilateral ears   He has been using mucinex. and Delsym.   Occ SOB.  Fatigued, cough keeping him up at night.Marland Kitchen  HAs been feeling worse since being off  100.8       Relevant past medical, surgical, family and social history reviewed and updated as indicated. Interim medical history since our last visit reviewed. Allergies and medications reviewed and updated. Outpatient Medications Prior to Visit  Medication Sig Dispense Refill   acetaminophen (TYLENOL) 500 MG tablet Take 1,000 mg by mouth 2 (two) times daily.     albuterol (VENTOLIN HFA) 108 (90 Base) MCG/ACT inhaler Inhale 2 puffs into the lungs every 4 (four) hours as needed for wheezing or shortness of breath. 8 g 0   benzonatate (TESSALON PERLES) 100 MG capsule Take 1 capsule (100 mg total) by mouth every 6 (six) hours as  needed for cough. 30 capsule 0   Bioflavonoid Products (ESTER-C PO) Take 1 tablet by mouth daily.     cetirizine (ZYRTEC) 10 MG tablet Take 10 mg by mouth daily.     clonazePAM (KLONOPIN) 1 MG tablet Take 1-2 tablets (1-2 mg total) by mouth at bedtime. 180 tablet 0   guaiFENesin 200 MG tablet Take 2 tablets (400 mg total) by mouth every 6 (six) hours as needed for cough or to loosen phlegm. 30 tablet 0   hydrochlorothiazide (HYDRODIURIL) 12.5 MG tablet TAKE 1 TABLET BY MOUTH EVERY DAY 90 tablet 3   losartan (COZAAR) 50 MG tablet Take 1 tablet (50 mg total) by mouth at bedtime. 90 tablet 3   pantoprazole (PROTONIX) 40 MG tablet Take 1 tablet (40 mg total) by mouth daily. 90 tablet 0   promethazine-dextromethorphan (PROMETHAZINE-DM) 6.25-15 MG/5ML syrup Take 5 mLs by mouth at bedtime as needed for cough. 118 mL 0   traMADol (ULTRAM) 50 MG tablet TAKE 1 TABLET TWICE A DAY UP TO EVERY 6 HOURS AS NEEDED FOR SEVERE PAIN 30 tablet 0   traZODone (DESYREL) 100 MG tablet Take 2 tablets (200 mg total) by mouth at bedtime. 180 tablet 0   No facility-administered medications prior to visit.     Per HPI unless specifically indicated in ROS section below Review of Systems  Constitutional:  Positive for fatigue. Negative for fever.  HENT:  Positive for ear pain.   Eyes:  Negative for pain.  Respiratory:  Positive for cough. Negative for shortness of breath.   Cardiovascular:  Negative for chest pain, palpitations and leg swelling.  Gastrointestinal:  Negative for abdominal pain.  Genitourinary:  Negative for dysuria.  Musculoskeletal:  Negative for arthralgias.  Neurological:  Negative for syncope, light-headedness and headaches.  Psychiatric/Behavioral:  Negative for dysphoric mood.    Objective:  BP 120/70   Pulse 88   Temp 97.7 F (36.5 C) (Temporal)   Ht 5' 4.5" (1.638 m)   Wt 228 lb 4 oz (103.5 kg)   SpO2 97%   BMI 38.57 kg/m   Wt Readings from Last 3 Encounters:  04/21/22 228 lb 4 oz  (103.5 kg)  04/15/22 216 lb 0.8 oz (98 kg)  04/08/22 218 lb (98.9 kg)      Physical Exam Constitutional:      Appearance: He is well-developed.  HENT:     Head: Normocephalic.     Right Ear: Hearing normal. A middle ear effusion is present. There is impacted cerumen. Tympanic membrane is erythematous.     Left Ear: Hearing normal.  No middle ear effusion.     Ears:     Comments: Right ear simple irrigation, after cerumen removed, fullness and pain in right ear improved Right TM after cerumen removed did appear mildly erythematous with some effusion    Nose: Nose normal.  Neck:     Thyroid: No thyroid mass or thyromegaly.     Vascular: No carotid bruit.     Trachea: Trachea normal.  Cardiovascular:     Rate and Rhythm: Normal rate and regular rhythm.     Pulses: Normal pulses.     Heart sounds: Heart sounds not distant. No murmur heard.    No friction rub. No gallop.     Comments: No peripheral edema Pulmonary:     Effort: Pulmonary effort is normal. No respiratory distress.     Breath sounds: Normal breath sounds.  Skin:    General: Skin is warm and dry.     Findings: No rash.  Psychiatric:        Speech: Speech normal.        Behavior: Behavior normal.        Thought Content: Thought content normal.       Results for orders placed or performed during the hospital encounter of 04/15/22  CBC with Differential  Result Value Ref Range   WBC 11.4 (H) 4.0 - 10.5 K/uL   RBC 4.05 (L) 4.22 - 5.81 MIL/uL   Hemoglobin 12.8 (L) 13.0 - 17.0 g/dL   HCT 36.1 (L) 39.0 - 52.0 %   MCV 89.1 80.0 - 100.0 fL   MCH 31.6 26.0 - 34.0 pg   MCHC 35.5 30.0 - 36.0 g/dL   RDW 12.2 11.5 - 15.5 %   Platelets 342 150 - 400 K/uL   nRBC 0.0 0.0 - 0.2 %   Neutrophils Relative % 76 %   Neutro Abs 8.7 (H) 1.7 - 7.7 K/uL   Lymphocytes Relative 11 %   Lymphs Abs 1.2 0.7 - 4.0 K/uL   Monocytes Relative 4 %   Monocytes Absolute 0.4 0.1 - 1.0 K/uL   Eosinophils Relative 4 %   Eosinophils Absolute  0.4 0.0 - 0.5 K/uL   Basophils Relative 1 %   Basophils Absolute 0.1 0.0 - 0.1 K/uL   Immature  Granulocytes 4 %   Abs Immature Granulocytes 0.43 (H) 0.00 - 0.07 K/uL  Basic metabolic panel  Result Value Ref Range   Sodium 137 135 - 145 mmol/L   Potassium 2.9 (L) 3.5 - 5.1 mmol/L   Chloride 100 98 - 111 mmol/L   CO2 25 22 - 32 mmol/L   Glucose, Bld 192 (H) 70 - 99 mg/dL   BUN 17 8 - 23 mg/dL   Creatinine, Ser 1.10 0.61 - 1.24 mg/dL   Calcium 8.4 (L) 8.9 - 10.3 mg/dL   GFR, Estimated >60 >60 mL/min   Anion gap 12 5 - 15    Assessment and Plan Symptoms most consistent with prolonged COVID infection with  reactive airway cough.  Will treat with intramuscular Depo-Medrol 40 mg x 1 today given some benefit in the past per the patient over oral steroids.  He does report GI side effects to oral steroids. Also will treat for possible right otitis media with azithromycin.  This antibiotic will also cover possible bacterial superinfection.  ER and return precautions provided.  COVID-19 -     methylPREDNISolone Acetate  Cough, persistent -     methylPREDNISolone Acetate  Non-recurrent acute suppurative otitis media of right ear without spontaneous rupture of tympanic membrane  Other orders -     Azithromycin; 2 tab po x 1 day then 1 tab po daily  Dispense: 6 tablet; Refill: 0       Eliezer Lofts, MD

## 2022-04-21 NOTE — Telephone Encounter (Signed)
error 

## 2022-04-21 NOTE — Telephone Encounter (Signed)
Noted  

## 2022-04-24 ENCOUNTER — Telehealth: Payer: Self-pay | Admitting: Family Medicine

## 2022-04-24 DIAGNOSIS — H938X3 Other specified disorders of ear, bilateral: Secondary | ICD-10-CM

## 2022-04-24 NOTE — Telephone Encounter (Signed)
Referral sent.  No location preference specified so none placed.  If patient has strong preference for location please add to order or let me know.

## 2022-04-24 NOTE — Telephone Encounter (Signed)
Patient was seen on 04/21/2022 with bedsole and was prescribed an antibiotic. He stated that his cough is better,however his ears are still really stuffed up. He would like to know if he could receive a referral to ENT to get better evaluated due to him dealing with this for 3 weeks now.

## 2022-04-24 NOTE — Telephone Encounter (Signed)
Mr. Sego notified by telephone that Dr. Diona Browner has placed the ENT referral.  Patient does not have a preference to [facility] or [facility].  Just whoever he can get in with the fastest.

## 2022-04-27 ENCOUNTER — Encounter: Payer: Self-pay | Admitting: *Deleted

## 2022-05-07 DIAGNOSIS — H6501 Acute serous otitis media, right ear: Secondary | ICD-10-CM | POA: Diagnosis not present

## 2022-05-07 DIAGNOSIS — H90A31 Mixed conductive and sensorineural hearing loss, unilateral, right ear with restricted hearing on the contralateral side: Secondary | ICD-10-CM | POA: Diagnosis not present

## 2022-05-07 DIAGNOSIS — H6981 Other specified disorders of Eustachian tube, right ear: Secondary | ICD-10-CM | POA: Diagnosis not present

## 2022-06-09 ENCOUNTER — Ambulatory Visit (INDEPENDENT_AMBULATORY_CARE_PROVIDER_SITE_OTHER): Payer: Medicare HMO | Admitting: Family Medicine

## 2022-06-09 ENCOUNTER — Encounter: Payer: Self-pay | Admitting: Family Medicine

## 2022-06-09 VITALS — BP 130/84 | HR 74 | Temp 97.0°F | Ht 65.0 in | Wt 223.1 lb

## 2022-06-09 DIAGNOSIS — M25562 Pain in left knee: Secondary | ICD-10-CM | POA: Diagnosis not present

## 2022-06-09 DIAGNOSIS — C61 Malignant neoplasm of prostate: Secondary | ICD-10-CM | POA: Diagnosis not present

## 2022-06-09 DIAGNOSIS — R69 Illness, unspecified: Secondary | ICD-10-CM | POA: Diagnosis not present

## 2022-06-09 DIAGNOSIS — M545 Low back pain, unspecified: Secondary | ICD-10-CM | POA: Diagnosis not present

## 2022-06-09 DIAGNOSIS — I10 Essential (primary) hypertension: Secondary | ICD-10-CM

## 2022-06-09 DIAGNOSIS — E041 Nontoxic single thyroid nodule: Secondary | ICD-10-CM | POA: Diagnosis not present

## 2022-06-09 DIAGNOSIS — K2100 Gastro-esophageal reflux disease with esophagitis, without bleeding: Secondary | ICD-10-CM | POA: Diagnosis not present

## 2022-06-09 DIAGNOSIS — M25561 Pain in right knee: Secondary | ICD-10-CM

## 2022-06-09 DIAGNOSIS — E7800 Pure hypercholesterolemia, unspecified: Secondary | ICD-10-CM | POA: Diagnosis not present

## 2022-06-09 DIAGNOSIS — F5104 Psychophysiologic insomnia: Secondary | ICD-10-CM

## 2022-06-09 DIAGNOSIS — G8929 Other chronic pain: Secondary | ICD-10-CM | POA: Diagnosis not present

## 2022-06-09 DIAGNOSIS — M159 Polyosteoarthritis, unspecified: Secondary | ICD-10-CM | POA: Diagnosis not present

## 2022-06-09 NOTE — Assessment & Plan Note (Signed)
Has follow up upcoming with urology.  PSA dropping at last check.  Has SE of hot flashes ans fatigue from hormones.

## 2022-06-09 NOTE — Assessment & Plan Note (Signed)
Stable, chronic.  Continue current medication.  Losartan 50 mg p.o. daily HCTZ 12.5 mg daily

## 2022-06-09 NOTE — Assessment & Plan Note (Addendum)
Due for reevaluation.  At last check.. not on statin.   Cardiac calcium score 2021: 0 The 10-year ASCVD risk score (Arnett DK, et al., 2019) is: 19.4%   Values used to calculate the score:     Age: 67 years     Sex: Male     Is Non-Hispanic African American: No     Diabetic: No     Tobacco smoker: No     Systolic Blood Pressure: 130 mmHg     Is BP treated: Yes     HDL Cholesterol: 35.7 mg/dL     Total Cholesterol: 180 mg/dL

## 2022-06-09 NOTE — Assessment & Plan Note (Addendum)
Chronic,  DDD,  Status post physical therapy... minimal help. Tylenol  for pain BID, lidocaine patches, heat. Tramadol 50 mg 1 tablet p.o. twice daily as needed pain, #15 per month

## 2022-06-09 NOTE — Progress Notes (Signed)
Patient ID: Casey [name], male    DOB: May 28, 1955, 67 y.o.   MRN: [id number]  This visit was conducted in person.  BP 130/84   Pulse 74   Temp (!) 97 F (36.1 C) (Temporal)   Ht 5\' 5"  (1.651 m)   Wt 223 lb 2 oz (101.2 kg)   SpO2 96%   BMI 37.13 kg/m    CC:  Chief Complaint  Patient presents with   Establish Care    TOC from Dr. Selena [name]    Subjective:   HPI: Casey [name] is a 67 y.o. male presenting on 06/09/2022 for Establish Care (TOC from Dr. Selena [name])  Previous PCP: Casey [name] Last Medicare wellness: January 22, 2021, refused in 2023   COVID  infeciton  Still has decreased hearing in right ear.. Has upcoming OV for ENT... may need tubes placed.  Wheeze and cough improved.   Prostate cancer: Diagnosed 2022 stage IIc Followed by Dr. Annabell [name] Urology reviewed last office visit January 21, 2022 S/P radiation and hormone treatment.  Hypertension:  Well-controlled on losartan 50 mg p.o. daily and HCTZ 12.5 mg daily BP Readings from Last 3 Encounters:  06/09/22 130/84  04/21/22 120/70  04/15/22 122/68  Using medication without problems or lightheadedness:  none Chest pain with exertion: none Edema: none Short of breath: some from deconditioning. Average home BPs: Other issues:  GERD, well-controlled on pantoprazole 40 mg p.o. daily    Chronic insomnia: Klonopin 1 mg 1 to 2 tablets daily at bedtime Trazodone 200 mg p.o. nightly Flowsheet Row Office Visit from 04/08/2022 in [facility]  PHQ-2 Total Score 1        Chronic back pain tramadol 1 tablet twice daily as needed for pain PDMP reviewed during this encounter.   High cholesterol.. due for re-eval.  Cardiac calcium score 2021 0 Lab Results  Component Value Date   CHOL 180 12/26/2020   HDL 35.70 (L) 12/26/2020   LDLCALC 125 (H) 12/26/2020   TRIG 100.0 12/26/2020   CHOLHDL 5 12/26/2020   The 10-year ASCVD risk score (Arnett DK, et al., 2019) is: 19.4%   Values used to  calculate the score:     Age: 1 years     Sex: Male     Is Non-Hispanic African American: No     Diabetic: No     Tobacco smoker: No     Systolic Blood Pressure: 130 mmHg     Is BP treated: Yes     HDL Cholesterol: 35.7 mg/dL     Total Cholesterol: 180 mg/dL   Minimal activity.  Body mass index is 37.13 kg/m.  Relevant past medical, surgical, family and social history reviewed and updated as indicated. Interim medical history since our last visit reviewed. Allergies and medications reviewed and updated. Outpatient Medications Prior to Visit  Medication Sig Dispense Refill   acetaminophen (TYLENOL) 500 MG tablet Take 1,000 mg by mouth 2 (two) times daily.     albuterol (VENTOLIN HFA) 108 (90 Base) MCG/ACT inhaler Inhale 2 puffs into the lungs every 4 (four) hours as needed for wheezing or shortness of breath. 8 g 0   Bioflavonoid Products (ESTER-C PO) Take 2 tablets by mouth daily.     cetirizine (ZYRTEC) 10 MG tablet Take 10 mg by mouth daily.     clonazePAM (KLONOPIN) 1 MG tablet Take 1-2 tablets (1-2 mg total) by mouth at bedtime. 180 tablet 0   hydrochlorothiazide (HYDRODIURIL) 12.5 MG tablet TAKE  1 TABLET BY MOUTH EVERY DAY 90 tablet 3   losartan (COZAAR) 50 MG tablet Take 1 tablet (50 mg total) by mouth at bedtime. 90 tablet 3   pantoprazole (PROTONIX) 40 MG tablet Take 1 tablet (40 mg total) by mouth daily. 90 tablet 0   traMADol (ULTRAM) 50 MG tablet TAKE 1 TABLET TWICE A DAY UP TO EVERY 6 HOURS AS NEEDED FOR SEVERE PAIN 30 tablet 0   traZODone (DESYREL) 100 MG tablet Take 2 tablets (200 mg total) by mouth at bedtime. 180 tablet 0   azithromycin (ZITHROMAX) 250 MG tablet 2 tab po x 1 day then 1 tab po daily 6 tablet 0   benzonatate (TESSALON PERLES) 100 MG capsule Take 1 capsule (100 mg total) by mouth every 6 (six) hours as needed for cough. 30 capsule 0   guaiFENesin 200 MG tablet Take 2 tablets (400 mg total) by mouth every 6 (six) hours as needed for cough or to loosen  phlegm. 30 tablet 0   promethazine-dextromethorphan (PROMETHAZINE-DM) 6.25-15 MG/5ML syrup Take 5 mLs by mouth at bedtime as needed for cough. 118 mL 0   No facility-administered medications prior to visit.     Per HPI unless specifically indicated in ROS section below Review of Systems  Constitutional:  Negative for fatigue and fever.  HENT:  Negative for ear pain.   Eyes:  Negative for pain.  Respiratory:  Negative for cough and shortness of breath.   Cardiovascular:  Negative for chest pain, palpitations and leg swelling.  Gastrointestinal:  Negative for abdominal pain.  Genitourinary:  Negative for dysuria.  Musculoskeletal:  Negative for arthralgias.  Neurological:  Negative for syncope, light-headedness and headaches.  Psychiatric/Behavioral:  Negative for dysphoric mood.    Objective:  BP 130/84   Pulse 74   Temp (!) 97 F (36.1 C) (Temporal)   Ht 5\' 5"  (1.651 m)   Wt 223 lb 2 oz (101.2 kg)   SpO2 96%   BMI 37.13 kg/m   Wt Readings from Last 3 Encounters:  06/09/22 223 lb 2 oz (101.2 kg)  04/21/22 228 lb 4 oz (103.5 kg)  04/15/22 216 lb 0.8 oz (98 kg)      Physical Exam Constitutional:      Appearance: He is well-developed.  HENT:     Head: Normocephalic.     Right Ear: Hearing normal.     Left Ear: Hearing normal.     Nose: Nose normal.  Neck:     Thyroid: No thyroid mass or thyromegaly.     Vascular: No carotid bruit.     Trachea: Trachea normal.  Cardiovascular:     Rate and Rhythm: Normal rate and regular rhythm.     Pulses: Normal pulses.     Heart sounds: Heart sounds not distant. No murmur heard.    No friction rub. No gallop.     Comments: No peripheral edema Pulmonary:     Effort: Pulmonary effort is normal. No respiratory distress.     Breath sounds: Normal breath sounds.  Skin:    General: Skin is warm and dry.     Findings: No rash.  Psychiatric:        Speech: Speech normal.        Behavior: Behavior normal.        Thought Content:  Thought content normal.       Results for orders placed or performed during the hospital encounter of 04/15/22  CBC with Differential  Result Value Ref Range  WBC 11.4 (H) 4.0 - 10.5 K/uL   RBC 4.05 (L) 4.22 - 5.81 MIL/uL   Hemoglobin 12.8 (L) 13.0 - 17.0 g/dL   HCT 67.5 (L) 91.6 - 38.4 %   MCV 89.1 80.0 - 100.0 fL   MCH 31.6 26.0 - 34.0 pg   MCHC 35.5 30.0 - 36.0 g/dL   RDW 66.5 99.3 - 57.0 %   Platelets 342 150 - 400 K/uL   nRBC 0.0 0.0 - 0.2 %   Neutrophils Relative % 76 %   Neutro Abs 8.7 (H) 1.7 - 7.7 K/uL   Lymphocytes Relative 11 %   Lymphs Abs 1.2 0.7 - 4.0 K/uL   Monocytes Relative 4 %   Monocytes Absolute 0.4 0.1 - 1.0 K/uL   Eosinophils Relative 4 %   Eosinophils Absolute 0.4 0.0 - 0.5 K/uL   Basophils Relative 1 %   Basophils Absolute 0.1 0.0 - 0.1 K/uL   Immature Granulocytes 4 %   Abs Immature Granulocytes 0.43 (H) 0.00 - 0.07 K/uL  Basic metabolic panel  Result Value Ref Range   Sodium 137 135 - 145 mmol/L   Potassium 2.9 (L) 3.5 - 5.1 mmol/L   Chloride 100 98 - 111 mmol/L   CO2 25 22 - 32 mmol/L   Glucose, Bld 192 (H) 70 - 99 mg/dL   BUN 17 8 - 23 mg/dL   Creatinine, Ser 1.77 0.61 - 1.24 mg/dL   Calcium 8.4 (L) 8.9 - 10.3 mg/dL   GFR, Estimated >93 >90 mL/min   Anion gap 12 5 - 15    Assessment and Plan  Primary hypertension Assessment & Plan: Stable, chronic.  Continue current medication.  Losartan 50 mg p.o. daily HCTZ 12.5 mg daily   Gastroesophageal reflux disease with esophagitis, unspecified whether hemorrhage Assessment & Plan: Stable, chronic.  Continue current medication.  Pantoprazole 40 mg p.o. daily   Chronic right-sided low back pain without sciatica Assessment & Plan: Chronic,  DDD,  Status post physical therapy... minimal help. Tylenol  for pain BID, lidocaine patches, heat. Tramadol 50 mg 1 tablet p.o. twice daily as needed pain, #15 per month   Hypercholesterolemia Assessment & Plan: Due for reevaluation.  At  last check.. not on statin.   Cardiac calcium score 2021: 0 The 10-year ASCVD risk score (Arnett DK, et al., 2019) is: 19.4%   Values used to calculate the score:     Age: 80 years     Sex: Male     Is Non-Hispanic African American: No     Diabetic: No     Tobacco smoker: No     Systolic Blood Pressure: 130 mmHg     Is BP treated: Yes     HDL Cholesterol: 35.7 mg/dL     Total Cholesterol: 180 mg/dL    Prostate cancer Assessment & Plan:  Has follow up upcoming with urology.  PSA dropping at last check.  Has SE of hot flashes ans fatigue from hormones.   Primary osteoarthritis involving multiple joints Assessment & Plan: Knee, lumbar spine   Bilateral chronic knee pain  Chronic insomnia Assessment & Plan:  Chronic, requires clonazepam 2 mg at bedtime along with trazodone for sleep.  No depression or anxiety per pt. Flowsheet Row Office Visit from 04/08/2022 in [facility] [place]  PHQ-2 Total Score 1         Thyroid nodule    Return in about 3 months (around 09/08/2022) for phone AMW,  fasting labs then  CPE with me.   Kerby Nora, MD

## 2022-06-09 NOTE — Assessment & Plan Note (Signed)
Knee, lumbar spine

## 2022-06-09 NOTE — Assessment & Plan Note (Signed)
Stable, chronic.  Continue current medication.  Pantoprazole 40 mg p.o. daily

## 2022-06-09 NOTE — Assessment & Plan Note (Signed)
Chronic, requires clonazepam 2 mg at bedtime along with trazodone for sleep.  No depression or anxiety per pt. Flowsheet Row Office Visit from 04/08/2022 in [facility] Total Score 1

## 2022-06-16 DIAGNOSIS — H04123 Dry eye syndrome of bilateral lacrimal glands: Secondary | ICD-10-CM | POA: Diagnosis not present

## 2022-06-16 DIAGNOSIS — H52223 Regular astigmatism, bilateral: Secondary | ICD-10-CM | POA: Diagnosis not present

## 2022-06-16 DIAGNOSIS — Z135 Encounter for screening for eye and ear disorders: Secondary | ICD-10-CM | POA: Diagnosis not present

## 2022-06-16 DIAGNOSIS — H524 Presbyopia: Secondary | ICD-10-CM | POA: Diagnosis not present

## 2022-06-16 DIAGNOSIS — Z961 Presence of intraocular lens: Secondary | ICD-10-CM | POA: Diagnosis not present

## 2022-06-16 DIAGNOSIS — H5203 Hypermetropia, bilateral: Secondary | ICD-10-CM | POA: Diagnosis not present

## 2022-06-18 DIAGNOSIS — H6521 Chronic serous otitis media, right ear: Secondary | ICD-10-CM | POA: Diagnosis not present

## 2022-06-18 DIAGNOSIS — H6981 Other specified disorders of Eustachian tube, right ear: Secondary | ICD-10-CM | POA: Diagnosis not present

## 2022-06-18 DIAGNOSIS — H90A31 Mixed conductive and sensorineural hearing loss, unilateral, right ear with restricted hearing on the contralateral side: Secondary | ICD-10-CM | POA: Diagnosis not present

## 2022-07-06 ENCOUNTER — Other Ambulatory Visit: Payer: Self-pay | Admitting: Family Medicine

## 2022-07-06 DIAGNOSIS — G8929 Other chronic pain: Secondary | ICD-10-CM

## 2022-07-06 NOTE — Telephone Encounter (Signed)
Refill request Tramadol Last refill 04/10/22 #30 Last office visit 06/09/22

## 2022-07-10 ENCOUNTER — Other Ambulatory Visit: Payer: Self-pay | Admitting: *Deleted

## 2022-07-10 DIAGNOSIS — I10 Essential (primary) hypertension: Secondary | ICD-10-CM

## 2022-07-10 MED ORDER — LOSARTAN POTASSIUM 50 MG PO TABS: 50.0000 mg | ORAL_TABLET | Freq: Every day | ORAL | 3 refills | Status: DC

## 2022-07-22 DIAGNOSIS — C61 Malignant neoplasm of prostate: Secondary | ICD-10-CM | POA: Diagnosis not present

## 2022-07-22 DIAGNOSIS — H6981 Other specified disorders of Eustachian tube, right ear: Secondary | ICD-10-CM | POA: Diagnosis not present

## 2022-07-28 ENCOUNTER — Telehealth: Payer: Self-pay | Admitting: Family Medicine

## 2022-07-28 NOTE — Telephone Encounter (Signed)
Patient dropped off document Handicap Placard, to be filled out by provider. Patient requested to send it via Call Patient to pick up within 2-days. Document is located in providers tray at front office.Please advise at Mobile [phone] (mobile)

## 2022-07-28 NOTE — Telephone Encounter (Signed)
Handicap Placard Application placed in Dr. Ermalene Searing office in box to complete.

## 2022-07-29 DIAGNOSIS — N200 Calculus of kidney: Secondary | ICD-10-CM | POA: Diagnosis not present

## 2022-07-29 DIAGNOSIS — C61 Malignant neoplasm of prostate: Secondary | ICD-10-CM | POA: Diagnosis not present

## 2022-07-29 NOTE — Telephone Encounter (Signed)
Mr. Bonfield notified by telephone that his handicap placard application is ready to be picked up at the front desk.

## 2022-08-01 ENCOUNTER — Other Ambulatory Visit: Payer: Self-pay | Admitting: Family Medicine

## 2022-08-01 DIAGNOSIS — F1320 Sedative, hypnotic or anxiolytic dependence, uncomplicated: Secondary | ICD-10-CM

## 2022-08-01 DIAGNOSIS — F5101 Primary insomnia: Secondary | ICD-10-CM

## 2022-08-01 DIAGNOSIS — K2100 Gastro-esophageal reflux disease with esophagitis, without bleeding: Secondary | ICD-10-CM

## 2022-08-04 ENCOUNTER — Other Ambulatory Visit: Payer: Self-pay | Admitting: Family

## 2022-08-04 NOTE — Telephone Encounter (Signed)
Clonazepam  Last filled 04/10/22 #180 0 rf  Last o/v 06/09/22 F/u 09/10/22

## 2022-08-05 ENCOUNTER — Other Ambulatory Visit: Payer: Self-pay | Admitting: Family Medicine

## 2022-08-05 ENCOUNTER — Telehealth: Payer: Self-pay | Admitting: Family Medicine

## 2022-08-05 DIAGNOSIS — G8929 Other chronic pain: Secondary | ICD-10-CM

## 2022-08-05 MED ORDER — HYDROCHLOROTHIAZIDE 12.5 MG PO TABS: 12.5000 mg | ORAL_TABLET | Freq: Every day | ORAL | 3 refills | Status: DC

## 2022-08-05 MED ORDER — TRAZODONE HCL 100 MG PO TABS: 200.0000 mg | ORAL_TABLET | Freq: Every day | ORAL | 0 refills | Status: DC

## 2022-08-05 NOTE — Telephone Encounter (Signed)
Prescription Request  08/05/2022  LOV: 06/09/2022  What is the name of the medication or equipment? hydrochlorothiazide (HYDRODIURIL) 12.5 MG tablet , traZODone (DESYREL) 100 MG tablet   Have you contacted your pharmacy to request a refill? Yes   Which pharmacy would you like this sent to?  [id number]ann Sheen, [place] - [address] [place] [address] Phone: [phone] Fax: [phone]    Patient notified that their request is being sent to the clinical staff for review and that they should receive a response within 2 business days.   Please advise at Mobile [phone] (mobile)  Patient is out of medication trazodone, and is running low on the other

## 2022-08-05 NOTE — Addendum Note (Signed)
Addended by: Wendie Simmer B on: 08/05/2022 10:05 AM   Modules accepted: Orders

## 2022-08-05 NOTE — Telephone Encounter (Signed)
Erxs sent 

## 2022-08-16 ENCOUNTER — Emergency Department: Payer: Medicare HMO

## 2022-08-16 ENCOUNTER — Other Ambulatory Visit: Payer: Self-pay

## 2022-08-16 ENCOUNTER — Emergency Department
Admission: EM | Admit: 2022-08-16 | Discharge: 2022-08-16 | Disposition: A | Discharge status: Home / Self Care | Payer: Medicare HMO | Attending: Emergency Medicine | Admitting: Emergency Medicine

## 2022-08-16 DIAGNOSIS — I10 Essential (primary) hypertension: Secondary | ICD-10-CM | POA: Diagnosis not present

## 2022-08-16 DIAGNOSIS — N200 Calculus of kidney: Secondary | ICD-10-CM

## 2022-08-16 DIAGNOSIS — N132 Hydronephrosis with renal and ureteral calculous obstruction: Secondary | ICD-10-CM | POA: Diagnosis not present

## 2022-08-16 DIAGNOSIS — N202 Calculus of kidney with calculus of ureter: Secondary | ICD-10-CM | POA: Diagnosis not present

## 2022-08-16 DIAGNOSIS — N281 Cyst of kidney, acquired: Secondary | ICD-10-CM | POA: Diagnosis not present

## 2022-08-16 DIAGNOSIS — K5730 Diverticulosis of large intestine without perforation or abscess without bleeding: Secondary | ICD-10-CM | POA: Diagnosis not present

## 2022-08-16 DIAGNOSIS — R1031 Right lower quadrant pain: Secondary | ICD-10-CM | POA: Diagnosis not present

## 2022-08-16 DIAGNOSIS — N201 Calculus of ureter: Secondary | ICD-10-CM

## 2022-08-16 LAB — COMPREHENSIVE METABOLIC PANEL
ALT: 27 U/L (ref 0–44)
AST: 25 U/L (ref 15–41)
Albumin: 4 g/dL (ref 3.5–5.0)
Alkaline Phosphatase: 69 U/L (ref 38–126)
Anion gap: 11 (ref 5–15)
BUN: 25 mg/dL — ABNORMAL HIGH (ref 8–23)
CO2: 27 mmol/L (ref 22–32)
Calcium: 9.1 mg/dL (ref 8.9–10.3)
Chloride: 101 mmol/L (ref 98–111)
Creatinine, Ser: 1.43 mg/dL — ABNORMAL HIGH (ref 0.61–1.24)
GFR, Estimated: 54 mL/min — ABNORMAL LOW (ref >60)
Glucose, Bld: 158 mg/dL — ABNORMAL HIGH (ref 70–99)
Potassium: 3.5 mmol/L (ref 3.5–5.1)
Sodium: 139 mmol/L (ref 135–145)
Total Bilirubin: 0.7 mg/dL (ref 0.3–1.2)
Total Protein: 7.2 g/dL (ref 6.5–8.1)

## 2022-08-16 LAB — URINALYSIS, ROUTINE W REFLEX MICROSCOPIC
Bacteria, UA: NONE SEEN
Bilirubin Urine: NEGATIVE
Glucose, UA: NEGATIVE mg/dL
Ketones, ur: NEGATIVE mg/dL
Leukocytes,Ua: NEGATIVE
Nitrite: NEGATIVE
Protein, ur: NEGATIVE mg/dL
Specific Gravity, Urine: 1.043 — ABNORMAL HIGH (ref 1.005–1.030)
pH: 6 (ref 5.0–8.0)

## 2022-08-16 LAB — LIPASE, BLOOD: Lipase: 26 U/L (ref 11–51)

## 2022-08-16 LAB — CBC
HCT: 39.1 % (ref 39.0–52.0)
Hemoglobin: 13.4 g/dL (ref 13.0–17.0)
MCH: 31.5 pg (ref 26.0–34.0)
MCHC: 34.3 g/dL (ref 30.0–36.0)
MCV: 92 fL (ref 80.0–100.0)
Platelets: 219 10*3/uL (ref 150–400)
RBC: 4.25 MIL/uL (ref 4.22–5.81)
RDW: 12.2 % (ref 11.5–15.5)
WBC: 9.9 10*3/uL (ref 4.0–10.5)
nRBC: 0 % (ref 0.0–0.2)

## 2022-08-16 MED ORDER — KETOROLAC TROMETHAMINE 30 MG/ML IJ SOLN
15.0000 mg | Freq: Once | INTRAMUSCULAR | Status: AC
  Administered 2022-08-16: 15 mg via INTRAVENOUS
  Filled 2022-08-16: qty 1

## 2022-08-16 MED ORDER — ONDANSETRON 4 MG PO TBDP: ORAL_TABLET | ORAL | 0 refills | Status: DC

## 2022-08-16 MED ORDER — IOHEXOL 350 MG/ML SOLN
100.0000 mL | Freq: Once | INTRAVENOUS | Status: AC | PRN
  Administered 2022-08-16: 100 mL via INTRAVENOUS

## 2022-08-16 MED ORDER — OXYCODONE-ACETAMINOPHEN 5-325 MG PO TABS: 2.0000 | ORAL_TABLET | Freq: Four times a day (QID) | ORAL | 0 refills | Status: DC | PRN

## 2022-08-16 MED ORDER — DOCUSATE SODIUM 100 MG PO CAPS: ORAL_CAPSULE | ORAL | 0 refills | Status: DC

## 2022-08-16 MED ORDER — ONDANSETRON HCL 4 MG/2ML IJ SOLN
4.0000 mg | INTRAMUSCULAR | Status: AC
  Administered 2022-08-16: 4 mg via INTRAVENOUS
  Filled 2022-08-16: qty 2

## 2022-08-16 MED ORDER — LACTATED RINGERS IV BOLUS: 1000.0000 mL | Freq: Once | INTRAVENOUS | Status: DC

## 2022-08-16 MED ORDER — TAMSULOSIN HCL 0.4 MG PO CAPS: ORAL_CAPSULE | ORAL | 0 refills | Status: DC

## 2022-08-16 MED ORDER — LACTATED RINGERS IV BOLUS
1000.0000 mL | Freq: Once | INTRAVENOUS | Status: AC
  Administered 2022-08-16: 1000 mL via INTRAVENOUS

## 2022-08-16 NOTE — ED Provider Notes (Signed)
[facility] Provider Note    Event Date/Time   First MD Initiated Contact with Patient 08/16/22 0129     (approximate)   History   Abdominal Pain (Flank RIGHT since 5pm)   HPI Casey [name] is a 67 y.o. male who presents for evaluation of acute onset right flank pain which is moved into his right lower quadrant.  He has had nausea and vomiting as well.  This started within the last 10 hours or so.  He said it feels about the same but worse than prior episodes of kidney stones.  He has had numerous issues with kidney stones in the past and has required urological intervention.  However, he said that he has not had this bad of issues in at least 8 years.  Nothing in particular seems to make it better or worse and the pain has been constant for the last 8 or 10 hours.  He denies fever, dysuria, chest pain, shortness of breath.     Physical Exam   Triage Vital Signs: ED Triage Vitals  Enc Vitals Group     BP 08/16/22 0050 (!) 163/87     Pulse Rate 08/16/22 0050 69     Resp 08/16/22 0050 16     Temp 08/16/22 0050 98 F (36.7 C)     Temp Source 08/16/22 0050 Oral     SpO2 08/16/22 0050 94 %     Weight 08/16/22 0049 102.1 kg (225 lb)     Height 08/16/22 0049 1.651 m (5\' 5" )     Head Circumference --      Peak Flow --      Pain Score 08/16/22 0048 10     Pain Loc --      Pain Edu? --      Excl. in GC? --     Most recent vital signs: Vitals:   08/16/22 0142 08/16/22 0200  BP:  (!) 142/81  Pulse: 80 79  Resp: 15 16  Temp:    SpO2:      General: Awake, appears uncomfortable but is not in severe distress. CV:  Good peripheral perfusion.  Regular rate and rhythm. Resp:  Normal effort. Speaking easily and comfortably, no accessory muscle usage nor intercostal retractions.   Abd:  No distention.  Tenderness to palpation of the right lower quadrant with guarding.  Also has reproducible right flank tenderness to percussion.   ED Results / Procedures  / Treatments   Labs (all labs ordered are listed, but only abnormal results are displayed) Labs Reviewed  COMPREHENSIVE METABOLIC PANEL - Abnormal; Notable for the following components:      Result Value   Glucose, Bld 158 (*)    BUN 25 (*)    Creatinine, Ser 1.43 (*)    GFR, Estimated 54 (*)    All other components within normal limits  URINALYSIS, ROUTINE W REFLEX MICROSCOPIC - Abnormal; Notable for the following components:   Color, Urine YELLOW (*)    APPearance CLEAR (*)    Specific Gravity, Urine 1.043 (*)    Hgb urine dipstick MODERATE (*)    All other components within normal limits  LIPASE, BLOOD  CBC     EKG  ED ECG REPORT I, Loleta Rose, the attending physician, personally viewed and interpreted this ECG.  Date: 08/16/2022 EKG Time: 00: 52 Rate: 78 Rhythm: normal sinus rhythm QRS Axis: normal Intervals: normal ST/T Wave abnormalities: Non-specific ST segment / T-wave changes, but no clear evidence  of acute ischemia. Narrative Interpretation: no definitive evidence of acute ischemia; does not meet STEMI criteria.    RADIOLOGY I viewed and interpreted the patient's CT of the abdomen and pelvis with contrast.  He has a sizable right ureteral stone.  Radiology confirmed a 5 mm proximal ureteral stone with some hydronephrosis, consistent with the patient's symptoms.   PROCEDURES:  Critical Care performed: No  .1-3 Lead EKG Interpretation  Performed by: Loleta Rose, MD Authorized by: Loleta Rose, MD     Interpretation: normal     ECG rate:  77   ECG rate assessment: normal     Rhythm: sinus rhythm     Ectopy: none     Conduction: normal       IMPRESSION / MDM / ASSESSMENT AND PLAN / ED COURSE  I reviewed the triage vital signs and the nursing notes.                              Differential diagnosis includes, but is not limited to, renal/ureteral colic, UTI/pyelonephritis, infected stone, appendicitis.  Patient's presentation is most  consistent with acute presentation with potential threat to life or bodily function.  Labs/studies ordered: CT abdomen/pelvis, EKG, urinalysis, CBC, lipase, CMP.  Interventions/Medications given:  Medications  ketorolac (TORADOL) 30 MG/ML injection 15 mg (15 mg Intravenous Given 08/16/22 0150)  ondansetron (ZOFRAN) injection 4 mg (4 mg Intravenous Given 08/16/22 0149)  lactated ringers bolus 1,000 mL (1,000 mLs Intravenous New Bag/Given 08/16/22 0148)  iohexol (OMNIPAQUE) 350 MG/ML injection 100 mL (100 mLs Intravenous Contrast Given 08/16/22 0206)    (Note:  hospital course my include additional interventions and/or labs/studies not listed above.)   Stable vitals, labs notable for mild AKI, likely due to decreased intake and vomiting.  Patient not yet able to urinate.  No leukocytosis.  The patient is on the cardiac monitor to evaluate for evidence of arrhythmia and/or significant heart rate changes.  Ordered CT abd/pelvis w/ contrast to rule out appendicitis too.  Patient has 5mm right ureteral stone.  Will continue to hydrate and check UA.  Anticipate outpatient management.  Ordering 2nd L of fluid.  Patient's pain improved substantially after Toradol.  Clinical Course as of 08/16/22 8119  [name] Aug 16, 2022  0410 Patient feels much better and said the pain is almost completely gone.  Due to slow IV he has only received about 1 L of fluid but given that he is tolerating oral intake I think it is reasonable for him to be discharged.  He is comfortable with the plan as well and will follow-up with Dr. Annabell Howells in [facility] urology..  Medications as listed above after I verified there are no concerning prescribing habits in the [place] controlled substance database.  I gave my usual and customary return precautions. [CF]    Clinical Course User Index [CF] Loleta Rose, MD     FINAL CLINICAL IMPRESSION(S) / ED DIAGNOSES   Final diagnoses:  Right ureteral stone  Kidney stones      Rx / DC Orders   ED Discharge Orders          Ordered    oxyCODONE-acetaminophen (PERCOCET) 5-325 MG tablet  Every 6 hours PRN        08/16/22 0412    ondansetron (ZOFRAN-ODT) 4 MG disintegrating tablet        08/16/22 0412    tamsulosin (FLOMAX) 0.4 MG CAPS capsule  08/16/22 0412    docusate sodium (COLACE) 100 MG capsule        08/16/22 9147             Note:  This document was prepared using Dragon voice recognition software and may include unintentional dictation errors.   Loleta Rose, MD 08/16/22 [phone]

## 2022-08-16 NOTE — ED Triage Notes (Signed)
Pt to ed from home via POV for right sided flank pain. Pt has HX of kidney stones. Pt is caox4, in no acute distress, placed in wheel chair and moved to room.

## 2022-08-16 NOTE — Discharge Instructions (Signed)
You have been seen in the Emergency Department (ED) today for pain caused by kidney stones.  As we have discussed, please drink plenty of fluids.  Please make a follow up appointment with the physician(s) listed elsewhere in this documentation.  You may take pain medication as needed but ONLY as prescribed.  Please also take your prescribed Flomax daily.  If you are going to follow up with Urology for possible lithotripsy, please do not take any ibuprofen, naproxen, aspirin, Toradol, or other NSAID after Tuesday morning, as this may exclude you from lithotripsy.  Please see your doctor as soon as possible as stones may take 1-3 weeks to pass and you may require additional care or medications.  Do not drink alcohol, drive or participate in any other potentially dangerous activities while taking opiate pain medication as it may make you sleepy. Do not take this medication with any other sedating medications, either prescription or over-the-counter. If you were prescribed Percocet or Vicodin, do not take these with acetaminophen (Tylenol) as it is already contained within these medications.   Take Percocet as needed for severe pain.  This medication is an opiate (or narcotic) pain medication and can be habit forming.  Use it as little as possible to achieve adequate pain control.  Do not use or use it with extreme caution if you have a history of opiate abuse or dependence.  If you are on a pain contract with your primary care doctor or a pain specialist, be sure to let them know you were prescribed this medication today from the [facility].  This medication is intended for your use only - do not give any to anyone else and keep it in a secure place where nobody else, especially children, have access to it.  It will also cause or worsen constipation, so you may want to consider taking an over-the-counter stool softener while you are taking this medication.  Return to the Emergency  Department (ED) or call your doctor if you have any worsening pain, fever, painful urination, are unable to urinate, or develop other symptoms that concern you.  

## 2022-08-17 ENCOUNTER — Other Ambulatory Visit: Payer: Self-pay | Admitting: Urology

## 2022-08-17 DIAGNOSIS — N202 Calculus of kidney with calculus of ureter: Secondary | ICD-10-CM | POA: Diagnosis not present

## 2022-08-18 ENCOUNTER — Encounter (HOSPITAL_BASED_OUTPATIENT_CLINIC_OR_DEPARTMENT_OTHER): Payer: Self-pay | Admitting: Urology

## 2022-08-18 NOTE — Progress Notes (Signed)
Pre-op phone call complete. Procedure date and arrival time confirmed. Patient allergies, medications, and medical history verified. Patient can have clear liquids until 0545 and be NPO of food at midnight. Patient advised to not take hydrochlorothiazide DOS. Driver secured.

## 2022-08-21 ENCOUNTER — Ambulatory Visit (HOSPITAL_COMMUNITY): Payer: Medicare HMO

## 2022-08-21 ENCOUNTER — Encounter (HOSPITAL_BASED_OUTPATIENT_CLINIC_OR_DEPARTMENT_OTHER): Payer: Self-pay | Admitting: Anesthesiology

## 2022-08-21 ENCOUNTER — Other Ambulatory Visit: Payer: Self-pay

## 2022-08-21 ENCOUNTER — Encounter (HOSPITAL_BASED_OUTPATIENT_CLINIC_OR_DEPARTMENT_OTHER)
Admission: RE | Disposition: A | Discharge status: Home / Self Care | Payer: Self-pay | Source: Home / Self Care | Attending: Urology

## 2022-08-21 ENCOUNTER — Ambulatory Visit (HOSPITAL_BASED_OUTPATIENT_CLINIC_OR_DEPARTMENT_OTHER)
Admission: RE | Admit: 2022-08-21 | Discharge: 2022-08-21 | Disposition: A | Discharge status: Home / Self Care | Payer: Medicare HMO | Attending: Urology | Admitting: Urology

## 2022-08-21 ENCOUNTER — Encounter (HOSPITAL_BASED_OUTPATIENT_CLINIC_OR_DEPARTMENT_OTHER): Payer: Self-pay | Admitting: Urology

## 2022-08-21 DIAGNOSIS — G4730 Sleep apnea, unspecified: Secondary | ICD-10-CM | POA: Diagnosis not present

## 2022-08-21 DIAGNOSIS — N201 Calculus of ureter: Secondary | ICD-10-CM | POA: Insufficient documentation

## 2022-08-21 DIAGNOSIS — I10 Essential (primary) hypertension: Secondary | ICD-10-CM | POA: Insufficient documentation

## 2022-08-21 DIAGNOSIS — E669 Obesity, unspecified: Secondary | ICD-10-CM | POA: Insufficient documentation

## 2022-08-21 DIAGNOSIS — N200 Calculus of kidney: Secondary | ICD-10-CM | POA: Diagnosis not present

## 2022-08-21 DIAGNOSIS — Z841 Family history of disorders of kidney and ureter: Secondary | ICD-10-CM | POA: Diagnosis not present

## 2022-08-21 DIAGNOSIS — Z6837 Body mass index (BMI) 37.0-37.9, adult: Secondary | ICD-10-CM | POA: Diagnosis not present

## 2022-08-21 DIAGNOSIS — N202 Calculus of kidney with calculus of ureter: Secondary | ICD-10-CM | POA: Diagnosis not present

## 2022-08-21 HISTORY — PX: EXTRACORPOREAL SHOCK WAVE LITHOTRIPSY: SHX1557

## 2022-08-21 SURGERY — LITHOTRIPSY, ESWL
Anesthesia: LOCAL | Laterality: Right

## 2022-08-21 MED ORDER — SODIUM CHLORIDE 0.9% FLUSH: 3.0000 mL | Freq: Two times a day (BID) | INTRAVENOUS | Status: DC

## 2022-08-21 MED ORDER — DIAZEPAM 5 MG PO TABS
ORAL_TABLET | ORAL | Status: AC
  Filled 2022-08-21: qty 2

## 2022-08-21 MED ORDER — ONDANSETRON HCL 4 MG/2ML IJ SOLN
4.0000 mg | Freq: Once | INTRAMUSCULAR | Status: AC
  Administered 2022-08-21: 4 mg via INTRAVENOUS

## 2022-08-21 MED ORDER — DIAZEPAM 5 MG PO TABS
10.0000 mg | ORAL_TABLET | ORAL | Status: AC
  Administered 2022-08-21: 10 mg via ORAL

## 2022-08-21 MED ORDER — ONDANSETRON HCL 4 MG/2ML IJ SOLN
INTRAMUSCULAR | Status: AC
  Filled 2022-08-21: qty 2

## 2022-08-21 MED ORDER — HYDROMORPHONE HCL 2 MG PO TABS: 2.0000 mg | ORAL_TABLET | Freq: Four times a day (QID) | ORAL | 0 refills | Status: DC | PRN

## 2022-08-21 MED ORDER — DIPHENHYDRAMINE HCL 25 MG PO CAPS
25.0000 mg | ORAL_CAPSULE | ORAL | Status: AC
  Administered 2022-08-21: 25 mg via ORAL

## 2022-08-21 MED ORDER — CIPROFLOXACIN HCL 500 MG PO TABS
500.0000 mg | ORAL_TABLET | ORAL | Status: AC
  Administered 2022-08-21: 500 mg via ORAL

## 2022-08-21 MED ORDER — DIPHENHYDRAMINE HCL 25 MG PO CAPS
ORAL_CAPSULE | ORAL | Status: AC
  Filled 2022-08-21: qty 1

## 2022-08-21 MED ORDER — SODIUM CHLORIDE 0.9 % IV SOLN: INTRAVENOUS | Status: DC

## 2022-08-21 MED ORDER — HYDROMORPHONE HCL 2 MG PO TABS: 2.0000 mg | ORAL_TABLET | ORAL | Status: DC | PRN

## 2022-08-21 MED ORDER — ONDANSETRON 4 MG PO TBDP: ORAL_TABLET | ORAL | 1 refills | Status: DC

## 2022-08-21 MED ORDER — CIPROFLOXACIN HCL 500 MG PO TABS
ORAL_TABLET | ORAL | Status: AC
  Filled 2022-08-21: qty 1

## 2022-08-21 NOTE — Op Note (Signed)
See [facility] op note.

## 2022-08-21 NOTE — H&P (Signed)
I have ureteral stone.  HPI: Casey [name] is a 67 year-old male established patient who is here for ureteral stone.  The problem is on the right side.   08/17/22: Casey [name] had the onset Saturday afternoon of severe right flank pain with N/V. He was seen at [facility] and a CT shows a 5mm stone in the right proximal ureter.      ALLERGIES: Codeine Derivatives Prednisone Vicodin TABS    MEDICATIONS: Hydrochlorothiazide  Amlodipine Besilate  Cetirizine Hcl 10 mg tablet Oral  Clonazepam 2 mg tablet Oral  Losartan Potassium  Pantoprazole Sodium 40 mg tablet, delayed release Oral  Tramadol Hcl  Trazodone Hcl     GU PSH: Cystoscopy Insert Stent - 2014, 2012 Locm 300-399Mg /Ml Iodine,1Ml - 03/06/2021 PLACE RT DEVICE/MARKER, PROS - 06/13/2021 Prostate Needle Biopsy - 02/05/2021, 2012 Transperineal Plmt Biodegradable Matrl 1/Mlt Njx - 06/13/2021 Ureteroscopic stone removal - 2014, 2012 Varicocele repair, Left - about 1982 Vasectomy - about 1977       PSH Notes: Cystoscopy With Ureteroscopy With Removal Of Calculus, Cystoscopy With Insertion Of Ureteral Stent Right, Biopsy Of The Prostate Needle, Cystoscopy With Ureteroscopy With Removal Of Calculus, Cystoscopy With Insertion Of Ureteral Stent Left, Knee Surgery, Wrist Surgery, Foot Surgery, Foot Surgery, Shoulder Surgery   NON-GU PSH: Surgical Pathology, Gross And Microscopic Examination For Prostate Needle - 02/05/2021 Visit Complexity (formerly [facility]X) - 07/29/2022     GU PMH: Microscopic hematuria, I will get him back in for cystoscopy to evaluate the microhematuria. - 07/29/2022 Prostate Cancer, His PSA remains low with a castrate testosterone. He would like to forgo the Eligard today because of the bothersome hot flashes. I will just follow his PSA and testosterone in 3 months and then in 6 months with an OV. Generally the T will remained suppressed well beyond the 6 months duration of the injection. - 07/29/2022, His PSA is down with a castrate T.  He is due for Eligard today. , - 01/21/2022, His PSA has fallen with the ADT and EXRT. I will have him return in 3 months with a PSA, T, lipid profile and CMP. , - 09/22/2021, - 06/10/2021, He has high risk prostate cancer that will be treated with ADT and EXRT. I reviewed the risks of Space OAR and fiducials and he will return in a month with a PSA and T for an Eligard 45mg  injection. , - 05/07/2021, He has decided on EXRT for treatment of his high risk prostate cancer. He will need ADT for 18 months and will need SpaceOAR and fiducials as well. I will have him return a few days after the PET scan next week for firmagon and reviewed the risks and side effects in detail. I will get him set up for fiducials and SpaceOAR in coordination with Dr. Kathrynn Running. I reviewed the risks of bleeding, infection, rectal erosion and implant migration, thrombotic events and anesthetic risks. He will return 4-6 weeks after the initial firmagon for Eligard 45mg  IM. I will get a PSA and testosterone prior to that. , - 04/21/2021, - 04/03/2021, - 04/01/2021 (Stable), He has clinical T1c N0 MO GG4 high risk prostate cancer with possible ECE on MRI. He is not a good candidate for AS and I don't recommend cryo or HIFU. I discussed RALP and the risks in detail and since he is high risk for needing second line therapy consideration of participation in the Proteus trial if that is still enrolling and he meets the criteria and EXRT w/wo a seed boost and  with LT ADT. I reviewed the risks and side effects of ADT in detail. I reviewed the risks of the radiation options in detail. I reviewed the risks of SpaceOAR in detail. He was given supporting documents to review. He had questions about Proton beam but seems to be leaning toward surgery. I will get him set up to see Dr. Laverle Patter who he requested and also Dr. Kathrynn Running so as to expedite his care. He may be able to be seen in the [facility] as well. , - 03/12/2021, - 03/06/2021 Renal calculus - 07/29/2022, He has had  no stone sypmtoms. , - 01/21/2022, He has 2 left renal stones that I will monitor closely. If they enlarge, he may need ESWL. , - 05/07/2021, Nephrolithiasis, - 2015 ED due to arterial insufficiency, He has persistent ED. - 09/22/2021, His SHIM is 10 and I reviewed the impact of prostate cancer therapy on sexual function. If he has surgical therapy, he will likely need a left sided wide resection. , - 03/12/2021 Elevated PSA - 09/22/2021, - 04/21/2021, - 02/05/2021, His last PSA was up from the prior levels. His exam is benign. I will repeat a PSA today. F/U in 1 year with a PSA. , - 12/30/2020, Elevated prostate specific antigen (PSA), - 2016 Urinary Urgency - 09/22/2021 BPH w/o LUTS - 04/03/2021, He has minimal LUTS. , - 12/30/2020, Benign prostatic hypertrophy without lower urinary tract symptoms, - 2016 History of urolithiasis, He has passed a couple of stones but has no recent pain or hematuria. - 12/30/2020, - 2018 Right testicular pain, He has chronic right testicular pain post ejaculation. I discussed cord blocks and epididymectomy but he is not interested at this time. - 12/30/2020 Urinary Tract Inf, Unspec site, Pyuria - 2015 Dysuria, Dysuria - 2014 Flank Pain, Diffuse Abdominal Pain - 2014 Personal Hx Oth male genital organs diseases, History of epididymitis - 2014 Ureteral calculus, Proximal Ureteral Stone On The Left - 2014, Mid Ureteral Stone On The Left, - 2014, Proximal Ureteral Stone On The Right, - 2014      PMH Notes:  2010-08-04 09:48:14 - Note: Venous Thrombosis Of The Deep Vessels Of The Lower Extremity   NON-GU PMH: Flushing - 07/29/2022, He has increased hot flashes. I will try him on clonidine and reviewed the instructions and side effects. , - 01/21/2022 Hypercholesterolemia, His cholesterol is up on ADT. I will defer therapy to Dr. Ermalene Searing. - 01/21/2022 Nontoxic single thyroid nodule, Thyroid US ordered. I will refer to Dr. Gerrit Friends if there is a concern. - 05/07/2021 Other lack of  coordination - 04/03/2021 Encounter for general adult medical examination without abnormal findings, Encounter for preventive health examination - 2016 Personal history of other diseases of the circulatory system, History of hypertension - 2014 Personal history of other diseases of the nervous system and sense organs, History of sleep apnea - 2014 Personal history of other specified conditions, History of fatigue - 2014, History of heartburn, - 2014 Hypertension    FAMILY HISTORY: Arthritis - Mother Bladder Cancer - Mother Blood In Urine - Runs In Family Family Health Status Number - Runs In Family Heart Disease - Brother Kidney Stones - Runs in Family Lung Cancer - Mother nephrolithiasis - Brother renal failure - Brother   SOCIAL HISTORY: Marital Status: Married Preferred Language: English; Ethnicity: Not Hispanic Or Latino; Race: White Current Smoking Status: Patient has never smoked.   Tobacco Use Assessment Completed: Used Tobacco in last 30 days? Does not use smokeless tobacco. Drinks 2 caffeinated drinks  per day. Has not had a blood transfusion.     Notes: Occupation:, Never A Smoker, Tobacco Use, Caffeine Use, Alcohol Use, Marital History - Currently Married   REVIEW OF SYSTEMS:    GU Review Male:   Patient denies frequent urination, hard to postpone urination, burning/ pain with urination, get up at night to urinate, leakage of urine, stream starts and stops, trouble starting your stream, have to strain to urinate , erection problems, and penile pain.  Gastrointestinal (Upper):   Patient denies nausea, vomiting, and indigestion/ heartburn.  Gastrointestinal (Lower):   Patient denies diarrhea and constipation.  Constitutional:   Patient denies fever, night sweats, weight loss, and fatigue.  Skin:   Patient denies skin rash/ lesion and itching.  Eyes:   Patient denies blurred vision and double vision.  Ears/ Nose/ Throat:   Patient denies sore throat and sinus problems.   Hematologic/Lymphatic:   Patient denies swollen glands and easy bruising.  Cardiovascular:   Patient denies leg swelling and chest pains.  Respiratory:   Patient denies cough and shortness of breath.  Endocrine:   Patient denies excessive thirst.  Musculoskeletal:   Patient denies back pain and joint pain.  Neurological:   Patient denies headaches and dizziness.  Psychologic:   Patient denies depression and anxiety.   VITAL SIGNS:      08/17/2022 01:43 PM  BP 136/74 mmHg  Heart Rate 99 /min  Temperature 98.6 F / 37 C   MULTI-SYSTEM PHYSICAL EXAMINATION:    Constitutional: Obese. No physical deformities. Normally developed. Good grooming. in obvious pain  Respiratory: Normal breath sounds. No labored breathing, no use of accessory muscles.   Cardiovascular: Regular rate and rhythm. No murmur, no gallop.   Gastrointestinal: Abdominal tenderness, LCVAT, obese. No mass, no rigidity.      Complexity of Data:  Lab Test Review:   BMP  Records Review:   Previous Patient Records  Urine Test Review:   Urinalysis  X-Ray Review: KUB: Reviewed Films. Discussed With Patient.  C.T. Stone Protocol: Reviewed Films. Reviewed Report. Discussed With Patient.     07/22/22 01/15/22 09/15/21 06/03/21 12/30/20 02/25/17 06/09/14 03/01/12  PSA  Total PSA <0.015 ng/mL <0.015 ng/mL 0.042 ng/mL 3.27 ng/mL 13.50 ng/mL 9.13 ng/mL 4.58  3.87   Free PSA      1.52 ng/mL 0.83    % Free PSA      17 % PSA 18      07/22/22 01/15/22 06/03/21 02/09/11  Hormones  Testosterone, Total <10 ng/dL <04 ng/dL <54 ng/dL 098.11    Notes:                     Cr 1.43 in ER.    PROCEDURES:         KUB - [id number]  A single view of the abdomen is obtained. There a multiple stable left renal stones. There is a 7mm shadow at about L4-L5 that is consistent with the stone seen on CT. He has no other signficant bone, gas or soft tissue abnormalities.       Patient confirmed No Neulasta OnPro Device.           Visit Complexity -  G2211 Chronic management         Urinalysis w/Scope Dipstick Dipstick Cont'd Micro  Color: Yellow Bilirubin: Neg mg/dL WBC/hpf: 0 - 5/hpf  Appearance: Cloudy Ketones: Neg mg/dL RBC/hpf: 20 - 91/YNW  Specific Gravity: 1.025 Blood: 3+ ery/uL Bacteria: Rare (0-9/hpf)  pH: <=5.0 Protein: Neg  mg/dL Cystals: Amorph Urates  Glucose: Neg mg/dL Urobilinogen: 0.2 mg/dL Casts: NS (Not Seen)    Nitrites: Neg Trichomonas: Not Present    Leukocyte Esterase: Neg leu/uL Mucous: Present      Epithelial Cells: 0 - 5/hpf      Yeast: NS (Not Seen)      Sperm: Not Present         Ketoralac 60mg  - [id number], [id number] zero wasted   Qty: 60 Adm. By: Tawnya Crook  Unit: mg Lot No [id number]  Route: IM Exp. Date 06/01/2023  Freq: None Mfgr.:   Site: Left Hip         Phenergan 25mg  - [id number], [id number] zero wasted   Qty: 25 Adm. By: Tawnya Crook  Unit: mg Lot No [id number]  Route: IM Exp. Date 04/03/2023  Freq: None Mfgr.:   Site: Right Hip   ASSESSMENT:      ICD-10 Details  1 GU:   Ureteral calculus - N20.1 Acute, Systemic Symptoms - He has a 7mm right proximal stone with pain and nausea. He got some relief from Toradol and phenergan. I discussed options including URS, ESWL and PCNL. He tolerates stents poorly and would prefer ESWL. I will get him set up for Friday.   I reviewed the risks of ESWL including bleeding, infection, injury to the kidney or adjacent structures, failure to fragment the stone, need for ancillary procedures, thrombotic events, cardiac arrhythmias and sedation complications.     2   Ureteral obstruction secondary to calculous - N13.2 Acute, Systemic Symptoms  3   Renal calculus - N20.0 Chronic, Stable - He has multiple stable left renal stone.    PLAN:            Medications New Meds: Hydromorphone Hcl 2 mg tablet 1-2 tablet PO Q 6 H   #15  0 Refill(s)  Pharmacy Name:  [id number]  Address:  [address]   [place], [place] [address]  Phone:  [phone]  Fax:  [phone]            Orders X-Rays: KUB          Schedule Return Visit/Planned Activity: ASAP - Schedule Surgery  Procedure: 08/17/2022 at [facility] - [phone] - Phenergan 25mg  (Phenergan Per 50 Mg) - [id number], [phone]  Procedure: 08/17/2022 at [facility] - [phone] - Ketoralac 60mg  (Toradol per 15mg  (60 mg)) - [id number], [id number]  Procedure: 08/21/2022 - ESWL - [id number], right          Document Letter(s):  Created for Patient: Clinical Summary         Notes:   CC: Dr. Kerby Nora.         Next Appointment:      Next Appointment: 08/21/2022 01:45 PM    Appointment Type: Surgery     Location: [facility] [phone]    Provider: Bjorn Pippin, M.D.    Reason for Visit: OP NE RT ESWL

## 2022-08-21 NOTE — Interval H&P Note (Signed)
History and Physical Interval Note:  [name] is visible and stable on KUB.    08/21/2022 12:41 PM  Casey [name]  has presented today for surgery, with the diagnosis of RIGHT PROXIMAL STONE.  The various methods of treatment have been discussed with the patient and family. After consideration of risks, benefits and other options for treatment, the patient has consented to  Procedure(s): RIGHT EXTRACORPOREAL SHOCK WAVE LITHOTRIPSY (ESWL) (Right) as a surgical intervention.  The patient's history has been reviewed, patient examined, no change in status, stable for surgery.  I have reviewed the patient's chart and labs.  Questions were answered to the patient's satisfaction.     Bjorn Pippin

## 2022-08-24 ENCOUNTER — Encounter (HOSPITAL_BASED_OUTPATIENT_CLINIC_OR_DEPARTMENT_OTHER): Payer: Self-pay | Admitting: Urology

## 2022-09-01 ENCOUNTER — Telehealth: Payer: Self-pay | Admitting: *Deleted

## 2022-09-01 DIAGNOSIS — E7800 Pure hypercholesterolemia, unspecified: Secondary | ICD-10-CM

## 2022-09-01 DIAGNOSIS — R7303 Prediabetes: Secondary | ICD-10-CM

## 2022-09-01 DIAGNOSIS — N201 Calculus of ureter: Secondary | ICD-10-CM | POA: Diagnosis not present

## 2022-09-01 NOTE — Telephone Encounter (Signed)
-----   Message from Alvina Chou sent at 08/28/2022 12:13 PM EDT ----- Regarding: Lab orders for Monday, 7.8.24 Patient is scheduled for CPX labs, please order future labs, Thanks , Camelia Eng

## 2022-09-04 DIAGNOSIS — H6981 Other specified disorders of Eustachian tube, right ear: Secondary | ICD-10-CM | POA: Diagnosis not present

## 2022-09-07 ENCOUNTER — Other Ambulatory Visit (INDEPENDENT_AMBULATORY_CARE_PROVIDER_SITE_OTHER): Payer: Medicare HMO

## 2022-09-07 DIAGNOSIS — E7800 Pure hypercholesterolemia, unspecified: Secondary | ICD-10-CM

## 2022-09-07 DIAGNOSIS — R7303 Prediabetes: Secondary | ICD-10-CM | POA: Diagnosis not present

## 2022-09-07 LAB — COMPREHENSIVE METABOLIC PANEL
ALT: 23 U/L (ref 0–53)
AST: 20 U/L (ref 0–37)
Albumin: 4 g/dL (ref 3.5–5.2)
Alkaline Phosphatase: 57 U/L (ref 39–117)
BUN: 13 mg/dL (ref 6–23)
CO2: 29 mEq/L (ref 19–32)
Calcium: 9.3 mg/dL (ref 8.4–10.5)
Chloride: 102 mEq/L (ref 96–112)
Creatinine, Ser: 1.02 mg/dL (ref 0.40–1.50)
GFR: 75.88 mL/min (ref >60.00)
Glucose, Bld: 132 mg/dL — ABNORMAL HIGH (ref 70–99)
Potassium: 4.1 mEq/L (ref 3.5–5.1)
Sodium: 140 mEq/L (ref 135–145)
Total Bilirubin: 0.6 mg/dL (ref 0.2–1.2)
Total Protein: 6.7 g/dL (ref 6.0–8.3)

## 2022-09-07 LAB — LIPID PANEL
Cholesterol: 190 mg/dL (ref 0–200)
HDL: 42.3 mg/dL (ref >39.00)
LDL Cholesterol: 114 mg/dL — ABNORMAL HIGH (ref 0–99)
NonHDL: 147.69
Total CHOL/HDL Ratio: 4
Triglycerides: 170 mg/dL — ABNORMAL HIGH (ref 0.0–149.0)
VLDL: 34 mg/dL (ref 0.0–40.0)

## 2022-09-07 LAB — HEMOGLOBIN A1C: Hgb A1c MFr Bld: 5.7 % (ref 4.6–6.5)

## 2022-09-08 NOTE — Progress Notes (Signed)
No critical labs need to be addressed urgently. We will discuss labs in detail at upcoming office visit.   

## 2022-09-10 ENCOUNTER — Ambulatory Visit (INDEPENDENT_AMBULATORY_CARE_PROVIDER_SITE_OTHER): Payer: Medicare HMO | Admitting: Family Medicine

## 2022-09-10 ENCOUNTER — Encounter: Payer: Self-pay | Admitting: Family Medicine

## 2022-09-10 VITALS — BP 114/72 | HR 87 | Temp 97.3°F | Ht 65.0 in | Wt 226.5 lb

## 2022-09-10 DIAGNOSIS — I10 Essential (primary) hypertension: Secondary | ICD-10-CM

## 2022-09-10 DIAGNOSIS — M545 Low back pain, unspecified: Secondary | ICD-10-CM

## 2022-09-10 DIAGNOSIS — F1320 Sedative, hypnotic or anxiolytic dependence, uncomplicated: Secondary | ICD-10-CM | POA: Diagnosis not present

## 2022-09-10 DIAGNOSIS — Z0000 Encounter for general adult medical examination without abnormal findings: Secondary | ICD-10-CM

## 2022-09-10 DIAGNOSIS — G8929 Other chronic pain: Secondary | ICD-10-CM

## 2022-09-10 DIAGNOSIS — E7800 Pure hypercholesterolemia, unspecified: Secondary | ICD-10-CM

## 2022-09-10 DIAGNOSIS — R7303 Prediabetes: Secondary | ICD-10-CM | POA: Insufficient documentation

## 2022-09-10 NOTE — Progress Notes (Signed)
Patient ID: Casey [name], male    DOB: 27-Jul-1955, 67 y.o.   MRN: [id number]  This visit was conducted in person.  BP 114/72 (BP Location: Left Arm, Patient Position: Sitting, Cuff Size: Large)   Pulse 87   Temp (!) 97.3 F (36.3 C) (Temporal)   Ht 5\' 5"  (1.651 m)   Wt 226 lb 8 oz (102.7 kg)   SpO2 96%   BMI 37.69 kg/m    CC:  Chief Complaint  Patient presents with   Medicare Wellness    Subjective:   HPI: Casey [name] is a 67 y.o. male presenting on 09/10/2022 for Medicare Wellness   The patient presents for annual medicare wellness, complete physical and review of chronic health problems. He/She also has the following acute concerns today: none  I have personally reviewed the Medicare Annual Wellness questionnaire and have noted 1. The patient's medical and social history 2. Their use of alcohol, tobacco or illicit drugs 3. Their current medications and supplements 4. The patient's functional ability including ADL's, fall risks, home safety risks and hearing or visual             impairment. 5. Diet and physical activities 6. Evidence for depression or mood disorders 7.         Updated provider list Cognitive evaluation was performed and recorded on pt medicare questionnaire form. The patients weight, height, BMI and visual acuity have been recorded in the chart   I have made referrals, counseling and provided education to the patient based review of the above and I have provided the pt with a written personalized care plan for preventive services.    Documentation of this information was scanned into the electronic record under the media tab.  No falls in last 12 months.  Flowsheet Row Office Visit from 09/10/2022 in [facility] [place]  PHQ-2 Total Score 0      Hearing Screening - Comments:: Complete Hearing Test done at [facility] 2024 Vision Screening - Comments:: Wears Glasses-Eye Exam with Dr. Cori Razor April 2024   Previous  PCP: Selena Batten Last Medicare wellness: January 22, 2021, refused in 2023   Prostate cancer: Diagnosed 2022 stage IIc Followed by Dr. Annabell Howells [facility] reviewed last office visit January 21, 2022 S/P radiation and hormone treatment.  S/P passing kidney stones 3 weeks ago.  Hypertension:  Well-controlled on losartan 50 mg p.o. daily and HCTZ 12.5 mg daily BP Readings from Last 3 Encounters:  09/10/22 114/72  08/21/22 (!) 169/102  08/16/22 134/80  Using medication without problems or lightheadedness:  none Chest pain with exertion: none Edema: none Short of breath: some from deconditioning. Average home BPs: Other issues:  GERD, well-controlled on pantoprazole 40 mg p.o. daily    Chronic insomnia: Klonopin 1 mg 1 to 2 tablets daily at bedtime Trazodone 200 mg p.o. nightly Flowsheet Row Office Visit from 09/10/2022 in [facility]  PHQ-2 Total Score 0      Chronic back pain tramadol 1 tablet twice daily as needed for pain PDMP not reviewed this encounter.   High cholesterol..  Cardiac calcium score 2021 0 Lab Results  Component Value Date   CHOL 190 09/07/2022   HDL 42.30 09/07/2022   LDLCALC 114 (H) 09/07/2022   TRIG 170.0 (H) 09/07/2022   CHOLHDL 4 09/07/2022   The 10-year ASCVD risk score (Arnett DK, et al., 2019) is: 14.9%   Values used to calculate the score:  Age: 64 years     Sex: Male     Is Non-Hispanic African American: No     Diabetic: No     Tobacco smoker: No     Systolic Blood Pressure: 114 mmHg     Is BP treated: Yes     HDL Cholesterol: 42.3 mg/dL     Total Cholesterol: 190 mg/dL  Prediabetes  Lab Results  Component Value Date   HGBA1C 5.7 09/07/2022   Wt Readings from Last 3 Encounters:  09/10/22 226 lb 8 oz (102.7 kg)  08/21/22 223 lb (101.2 kg)  08/16/22 225 lb (102.1 kg)      Minimal activity.  Body mass index is 37.69 kg/m.  Relevant past medical, surgical, family and social history reviewed and  updated as indicated. Interim medical history since our last visit reviewed. Allergies and medications reviewed and updated. Outpatient Medications Prior to Visit  Medication Sig Dispense Refill   acetaminophen (TYLENOL) 500 MG tablet Take 1,000 mg by mouth 2 (two) times daily.     albuterol (VENTOLIN HFA) 108 (90 Base) MCG/ACT inhaler Inhale 2 puffs into the lungs every 4 (four) hours as needed for wheezing or shortness of breath. 8 g 0   cetirizine (ZYRTEC) 10 MG tablet Take 10 mg by mouth daily.     clonazePAM (KLONOPIN) 1 MG tablet TAKE 1-2 TABLETS (1-2 MG TOTAL) BY MOUTH AT BEDTIME. 180 tablet 0   docusate sodium (COLACE) 100 MG capsule Take 1 tablet once or twice daily as needed for constipation while taking narcotic pain medicine 30 capsule 0   hydrochlorothiazide (HYDRODIURIL) 12.5 MG tablet Take 1 tablet (12.5 mg total) by mouth daily. 90 tablet 3   HYDROmorphone (DILAUDID) 2 MG tablet Take 1 tablet (2 mg total) by mouth every 6 (six) hours as needed for severe pain. 15 tablet 0   losartan (COZAAR) 50 MG tablet Take 1 tablet (50 mg total) by mouth at bedtime. 90 tablet 3   ondansetron (ZOFRAN-ODT) 4 MG disintegrating tablet Allow 1-2 tablets to dissolve in your mouth every 8 hours as needed for nausea/vomiting 15 tablet 1   oxyCODONE-acetaminophen (PERCOCET) 5-325 MG tablet Take 2 tablets by mouth every 6 (six) hours as needed for severe pain. 16 tablet 0   tamsulosin (FLOMAX) 0.4 MG CAPS capsule Take 1 tablet by mouth daily until you pass the kidney stone or no longer have symptoms 14 capsule 0   traMADol (ULTRAM) 50 MG tablet TAKE 1 TABLET TWICE A DAY UP TO EVERY 6 HOURS AS NEEDED FOR SEVERE PAIN 30 tablet 0   traZODone (DESYREL) 100 MG tablet Take 2 tablets (200 mg total) by mouth at bedtime. 180 tablet 0   No facility-administered medications prior to visit.     Per HPI unless specifically indicated in ROS section below Review of Systems  Constitutional:  Negative for fatigue and  fever.  HENT:  Negative for ear pain.   Eyes:  Negative for pain.  Respiratory:  Negative for cough and shortness of breath.   Cardiovascular:  Negative for chest pain, palpitations and leg swelling.  Gastrointestinal:  Negative for abdominal pain.  Genitourinary:  Negative for dysuria.  Musculoskeletal:  Negative for arthralgias.  Neurological:  Negative for syncope, light-headedness and headaches.  Psychiatric/Behavioral:  Negative for dysphoric mood.    Objective:  BP 114/72 (BP Location: Left Arm, Patient Position: Sitting, Cuff Size: Large)   Pulse 87   Temp (!) 97.3 F (36.3 C) (Temporal)   Ht 5\' 5"  (1.651  m)   Wt 226 lb 8 oz (102.7 kg)   SpO2 96%   BMI 37.69 kg/m   Wt Readings from Last 3 Encounters:  09/10/22 226 lb 8 oz (102.7 kg)  08/21/22 223 lb (101.2 kg)  08/16/22 225 lb (102.1 kg)      Physical Exam Constitutional:      Appearance: He is well-developed.  HENT:     Head: Normocephalic.     Right Ear: Hearing normal.     Left Ear: Hearing normal.     Nose: Nose normal.  Neck:     Thyroid: No thyroid mass or thyromegaly.     Vascular: No carotid bruit.     Trachea: Trachea normal.  Cardiovascular:     Rate and Rhythm: Normal rate and regular rhythm.     Pulses: Normal pulses.     Heart sounds: Heart sounds not distant. No murmur heard.    No friction rub. No gallop.     Comments: No peripheral edema Pulmonary:     Effort: Pulmonary effort is normal. No respiratory distress.     Breath sounds: Normal breath sounds.  Skin:    General: Skin is warm and dry.     Findings: No rash.  Psychiatric:        Speech: Speech normal.        Behavior: Behavior normal.        Thought Content: Thought content normal.       Results for orders placed or performed in visit on 09/07/22  Comprehensive metabolic panel  Result Value Ref Range   Sodium 140 135 - 145 mEq/L   Potassium 4.1 3.5 - 5.1 mEq/L   Chloride 102 96 - 112 mEq/L   CO2 29 19 - 32 mEq/L   Glucose,  Bld 132 (H) 70 - 99 mg/dL   BUN 13 6 - 23 mg/dL   Creatinine, Ser 1.61 0.40 - 1.50 mg/dL   Total Bilirubin 0.6 0.2 - 1.2 mg/dL   Alkaline Phosphatase 57 39 - 117 U/L   AST 20 0 - 37 U/L   ALT 23 0 - 53 U/L   Total Protein 6.7 6.0 - 8.3 g/dL   Albumin 4.0 3.5 - 5.2 g/dL   GFR 09.60 >45.40 mL/min   Calcium 9.3 8.4 - 10.5 mg/dL  Lipid panel  Result Value Ref Range   Cholesterol 190 0 - 200 mg/dL   Triglycerides 981.1 (H) 0.0 - 149.0 mg/dL   HDL 91.47 >82.95 mg/dL   VLDL 62.1 0.0 - 30.8 mg/dL   LDL Cholesterol 657 (H) 0 - 99 mg/dL   Total CHOL/HDL Ratio 4    NonHDL 147.69   Hemoglobin A1c  Result Value Ref Range   Hgb A1c MFr Bld 5.7 4.6 - 6.5 %    Assessment and Plan The patient's preventative maintenance and recommended screening tests for an annual wellness exam were reviewed in full today. Brought up to date unless services declined.  Counselled on the importance of diet, exercise, and its role in overall health and mortality. The patient's FH and SH was reviewed, including their home life, tobacco status, and drug and alcohol status.    Vaccines: Up-to-date with tetanus Prostate Cancer Screen: Prostate cancer history followed by urology Lab Results  Component Value Date   PSA1 10.3 (H) 12/30/2017   PSA1 7.7 (H) 02/15/2017   PSA 1.75 08/15/2019   PSA 5.20 (H) 05/09/2015   PSA 4.57 (H) 05/01/2014  Colon Cancer Screen: Last colonoscopy May 14, 2014, repeat recommended in 10 years      Smoking Status: Non-smoker ETOH/ drug use: None/none  Hep C:  done   Medicare annual wellness visit, subsequent  Primary hypertension Assessment & Plan: Stable, chronic.  Continue current medication.  Losartan 50 mg p.o. daily HCTZ 12.5 mg daily   Benzodiazepine dependence (HCC) Assessment & Plan:  No abuse but unable to wean off.   Hypercholesterolemia Assessment & Plan: Chronic, elevated cardiovascular disease 10-year risk at 14.9%.   Orders: -     Lipid panel;  Future  Chronic right-sided low back pain without sciatica Assessment & Plan: Chronic,  DDD,  Status post physical therapy... minimal help. Tylenol  for pain BID, lidocaine patches, heat. Tramadol 50 mg 1 tablet p.o. twice daily as needed pain, #15 per month   Prediabetes Assessment & Plan:  Chronic,  worsening control per recent glucose but A1C in prediabetes range. Encouraged exercise, weight loss, healthy eating habits.  Stop sweet tea.    Orders: -     Hemoglobin A1c; Future    Return in about 3 months (around 12/11/2022) for lab only Cholesterol and A1C check AND in 1 year, phone [facility],  fasting labs then CPE with me.   Kerby Nora, MD

## 2022-09-10 NOTE — Assessment & Plan Note (Signed)
Stable, chronic.  Continue current medication.  Losartan 50 mg p.o. daily HCTZ 12.5 mg daily 

## 2022-09-10 NOTE — Patient Instructions (Signed)
Work on low [employer], stop sweet tea.  Work on low cholesterol diet.

## 2022-09-10 NOTE — Assessment & Plan Note (Signed)
Chronic, elevated cardiovascular disease 10-year risk at 14.9%.

## 2022-09-10 NOTE — Assessment & Plan Note (Signed)
No abuse but unable to wean off.

## 2022-09-10 NOTE — Assessment & Plan Note (Signed)
Chronic,  DDD,  Status post physical therapy... minimal help. Tylenol  for pain BID, lidocaine patches, heat. Tramadol 50 mg 1 tablet p.o. twice daily as needed pain, #15 per month 

## 2022-09-10 NOTE — Assessment & Plan Note (Signed)
Chronic,  worsening control per recent glucose but A1C in prediabetes range. Encouraged exercise, weight loss, healthy eating habits.  Stop sweet tea.

## 2022-09-29 ENCOUNTER — Telehealth: Payer: Self-pay | Admitting: Family Medicine

## 2022-09-29 ENCOUNTER — Other Ambulatory Visit: Payer: Self-pay | Admitting: Family Medicine

## 2022-09-29 DIAGNOSIS — G8929 Other chronic pain: Secondary | ICD-10-CM

## 2022-09-29 NOTE — Telephone Encounter (Signed)
Last office visit 09/10/2022 for MWV.  Last refilled 07/07/2022 for #30 with no refills.  Next appt: CPE 09/14/2023.

## 2022-09-29 NOTE — Telephone Encounter (Signed)
Pharmacy has already sent refill request over today.  Waiting on Dr. Daphine Deutscher approval.  See refill request.

## 2022-09-29 NOTE — Telephone Encounter (Signed)
Prescription Request  09/29/2022  LOV: 09/10/2022  What is the name of the medication or equipment? traMADol (ULTRAM) 50 MG tablet   Have you contacted your pharmacy to request a refill? No   Which pharmacy would you like this sent to?  [id number] Judithann Sheen, [place] - [address] [place] [address] Phone: [phone] Fax: [phone]    Patient notified that their request is being sent to the clinical staff for review and that they should receive a response within 2 business days.   Please advise at Mobile [phone] (mobile)   Pt states he spoke to insurance & was told Dr. Ermalene Searing can start prescribing 90 day supplies with 180 tablets, it'll be covered now instead of denied.

## 2022-10-19 ENCOUNTER — Telehealth: Payer: Self-pay | Admitting: Family Medicine

## 2022-10-19 MED ORDER — HYDROCHLOROTHIAZIDE 12.5 MG PO TABS: 12.5000 mg | ORAL_TABLET | Freq: Every day | ORAL | 3 refills | Status: DC

## 2022-10-19 NOTE — Telephone Encounter (Signed)
Refill sent as requested. 

## 2022-10-19 NOTE — Telephone Encounter (Signed)
Prescription Request  10/19/2022  LOV: 09/10/2022  What is the name of the medication or equipment? hydrochlorothiazide (HYDRODIURIL) 12.5 MG tablet   Have you contacted your pharmacy to request a refill? No   Which pharmacy would you like this sent to?  [facility] [id number] [place] Rough, [place] - [address] [place] [address] Phone: [phone] Fax: [phone]    Patient notified that their request is being sent to the clinical staff for review and that they should receive a response within 2 business days.   Please advise at Mobile [phone] (mobile)

## 2022-11-05 ENCOUNTER — Other Ambulatory Visit: Payer: Self-pay | Admitting: Family Medicine

## 2022-11-05 DIAGNOSIS — F5101 Primary insomnia: Secondary | ICD-10-CM

## 2022-11-05 DIAGNOSIS — F1320 Sedative, hypnotic or anxiolytic dependence, uncomplicated: Secondary | ICD-10-CM

## 2022-11-05 MED ORDER — CLONAZEPAM 1 MG PO TABS
1.0000 mg | ORAL_TABLET | Freq: Every day | ORAL | 0 refills | Status: DC
Start: 2022-11-05 — End: 2023-02-09

## 2022-11-05 NOTE — Telephone Encounter (Signed)
Last office visit 09/10/2022 for MWV.  Last refilled 08/04/22 for#180 with no refills.  Next Appt: CPE 09/14/2023.

## 2022-11-05 NOTE — Telephone Encounter (Signed)
Prescription Request  11/05/2022  LOV: 09/10/2022  What is the name of the medication or equipment? clonazePAM (KLONOPIN) 1 MG tablet [[id number]]   Have you contacted your pharmacy to request a refill? No   Which pharmacy would you like this sent to?  [facility] [id number] Nicholes Rough, [place] - [address] [place] [address] Phone: [phone] Fax: [phone]    Patient notified that their request is being sent to the clinical staff for review and that they should receive a response within 2 business days.   Please advise at Mobile [phone] (mobile)

## 2022-11-05 NOTE — Addendum Note (Signed)
Addended by: Damita Lack on: 11/05/2022 03:22 PM   Modules accepted: Orders

## 2022-11-06 ENCOUNTER — Telehealth: Payer: Self-pay | Admitting: Family Medicine

## 2022-11-06 MED ORDER — TRAZODONE HCL 100 MG PO TABS: 200.0000 mg | ORAL_TABLET | Freq: Every day | ORAL | 1 refills | Status: DC

## 2022-11-06 NOTE — Telephone Encounter (Signed)
Prescription Request  11/06/2022  LOV: 09/10/2022  What is the name of the medication or equipment?  traZODone (DESYREL) 100 MG tablet  Have you contacted your pharmacy to request a refill? Yes   Which pharmacy would you like this sent to?  [facility] [id number] Nicholes Rough, [place] - [address] [place] [address] Phone: [phone] Fax: [phone]    Patient notified that their request is being sent to the clinical staff for review and that they should receive a response within 2 business days.   Please advise at Mobile [phone] (mobile)

## 2022-11-06 NOTE — Telephone Encounter (Signed)
Refill sent as requested. 

## 2022-11-17 DIAGNOSIS — D2261 Melanocytic nevi of right upper limb, including shoulder: Secondary | ICD-10-CM | POA: Diagnosis not present

## 2022-11-17 DIAGNOSIS — Z85828 Personal history of other malignant neoplasm of skin: Secondary | ICD-10-CM | POA: Diagnosis not present

## 2022-11-17 DIAGNOSIS — D692 Other nonthrombocytopenic purpura: Secondary | ICD-10-CM | POA: Diagnosis not present

## 2022-11-17 DIAGNOSIS — D2262 Melanocytic nevi of left upper limb, including shoulder: Secondary | ICD-10-CM | POA: Diagnosis not present

## 2022-11-17 DIAGNOSIS — L570 Actinic keratosis: Secondary | ICD-10-CM | POA: Diagnosis not present

## 2022-11-17 DIAGNOSIS — L578 Other skin changes due to chronic exposure to nonionizing radiation: Secondary | ICD-10-CM | POA: Diagnosis not present

## 2022-11-17 DIAGNOSIS — L821 Other seborrheic keratosis: Secondary | ICD-10-CM | POA: Diagnosis not present

## 2022-11-17 DIAGNOSIS — D1801 Hemangioma of skin and subcutaneous tissue: Secondary | ICD-10-CM | POA: Diagnosis not present

## 2022-12-03 DIAGNOSIS — R69 Illness, unspecified: Secondary | ICD-10-CM | POA: Diagnosis not present

## 2022-12-10 ENCOUNTER — Other Ambulatory Visit: Payer: Self-pay | Admitting: Family Medicine

## 2022-12-10 DIAGNOSIS — K2100 Gastro-esophageal reflux disease with esophagitis, without bleeding: Secondary | ICD-10-CM

## 2022-12-10 NOTE — Telephone Encounter (Signed)
Script was d/c by another office ok to refill?

## 2022-12-11 ENCOUNTER — Other Ambulatory Visit: Payer: Medicare HMO

## 2023-01-26 ENCOUNTER — Telehealth: Payer: Self-pay | Admitting: Family Medicine

## 2023-01-26 DIAGNOSIS — M545 Low back pain, unspecified: Secondary | ICD-10-CM

## 2023-01-26 NOTE — Telephone Encounter (Signed)
Last office visit 09/10/2022 for MWV.  Last refilled 09/29/2022 for #30 with no refills.  Next Appt: CPE 09/14/2023.

## 2023-01-26 NOTE — Telephone Encounter (Signed)
Prescription Request  01/26/2023  LOV: 09/10/2022  What is the name of the medication or equipment?  traMADol (ULTRAM) 50 MG tablet   Have you contacted your pharmacy to request a refill? No   Which pharmacy would you like this sent to?  [facility] [id number] Nicholes Rough, [place] - [address] [place] [address] Phone: [phone] Fax: [phone]   Patient notified that their request is being sent to the clinical staff for review and that they should receive a response within 2 business days.   Please advise at [facility] [phone]

## 2023-01-27 NOTE — Telephone Encounter (Signed)
Currently on Dilaudid and percocet.  Will need PCP input for this refill.

## 2023-02-01 NOTE — Telephone Encounter (Signed)
Contact patient.  I believe the Dilaudid and Percocet were for post procedure several months ago in June 2024.  Please review and verify med list. Last refill of hydromorphone 08/21/2022 per PDMP.  No  percocet filled in 2024 per PDMP.  Verify with patient and I will likely refill tramadol, send back to me.

## 2023-02-01 NOTE — Telephone Encounter (Signed)
Called and spoke with patient and updated medication list.

## 2023-02-02 MED ORDER — TRAMADOL HCL 50 MG PO TABS
ORAL_TABLET | ORAL | 0 refills | Status: DC
Start: 2023-02-02 — End: 2023-03-26

## 2023-02-02 NOTE — Telephone Encounter (Signed)
Prescription refilled.

## 2023-02-08 ENCOUNTER — Other Ambulatory Visit: Payer: Self-pay | Admitting: Family Medicine

## 2023-02-08 DIAGNOSIS — F1320 Sedative, hypnotic or anxiolytic dependence, uncomplicated: Secondary | ICD-10-CM

## 2023-02-08 DIAGNOSIS — F5101 Primary insomnia: Secondary | ICD-10-CM

## 2023-02-08 NOTE — Telephone Encounter (Signed)
Last office visit 09/10/2022 for MWV.  Last refilled 11/05/2022 for #180 with no refills.  Next Appt: CPE 09/14/2022.

## 2023-02-16 DIAGNOSIS — L570 Actinic keratosis: Secondary | ICD-10-CM | POA: Diagnosis not present

## 2023-02-16 DIAGNOSIS — Z85828 Personal history of other malignant neoplasm of skin: Secondary | ICD-10-CM | POA: Diagnosis not present

## 2023-02-16 DIAGNOSIS — L821 Other seborrheic keratosis: Secondary | ICD-10-CM | POA: Diagnosis not present

## 2023-03-26 ENCOUNTER — Other Ambulatory Visit: Payer: Self-pay | Admitting: Family Medicine

## 2023-03-26 DIAGNOSIS — M545 Low back pain, unspecified: Secondary | ICD-10-CM

## 2023-03-26 NOTE — Telephone Encounter (Signed)
Last office visit 09/10/2022 for MWV.  Last refilled 02/02/2023 for #30 with no refills.  Next Appt:  CPE 09/14/2023.

## 2023-04-06 DIAGNOSIS — H5203 Hypermetropia, bilateral: Secondary | ICD-10-CM | POA: Diagnosis not present

## 2023-05-01 ENCOUNTER — Other Ambulatory Visit: Payer: Self-pay | Admitting: Family Medicine

## 2023-05-10 ENCOUNTER — Other Ambulatory Visit: Payer: Self-pay | Admitting: Family Medicine

## 2023-05-10 DIAGNOSIS — F5101 Primary insomnia: Secondary | ICD-10-CM

## 2023-05-10 DIAGNOSIS — F1320 Sedative, hypnotic or anxiolytic dependence, uncomplicated: Secondary | ICD-10-CM

## 2023-05-11 NOTE — Telephone Encounter (Signed)
 Last office visit 09/10/2022 for MWV.  Last refilled 02/09/2023 for #180 with no refills.  Next Appt: 09/14/2023 for #180 with no refills.  Next Appt: CPE 09/14/2023.

## 2023-05-24 DIAGNOSIS — K080 Exfoliation of teeth due to systemic causes: Secondary | ICD-10-CM | POA: Diagnosis not present

## 2023-06-10 ENCOUNTER — Other Ambulatory Visit: Payer: Self-pay | Admitting: Family Medicine

## 2023-06-10 DIAGNOSIS — K2100 Gastro-esophageal reflux disease with esophagitis, without bleeding: Secondary | ICD-10-CM

## 2023-07-12 ENCOUNTER — Other Ambulatory Visit: Payer: Self-pay | Admitting: Family Medicine

## 2023-07-12 DIAGNOSIS — M545 Low back pain, unspecified: Secondary | ICD-10-CM

## 2023-07-12 NOTE — Telephone Encounter (Signed)
 lvm for pt to call office to schedule appt Medicare Wellness with Cornelius Dill

## 2023-07-12 NOTE — Telephone Encounter (Signed)
 Last office visit 09/10/22 for MWV.  Last refilled 03/26/2023 for #30 with no refills.  Next Appt: 09/14/2023 for CPE.     Please schedule Medicare Wellness with Casey [name] prior to his CPE with Dr. Cherlyn Cornet on 09/14/2023.

## 2023-07-14 NOTE — Telephone Encounter (Signed)
 Pt called and schedule appt

## 2023-08-01 ENCOUNTER — Other Ambulatory Visit: Payer: Self-pay | Admitting: Family Medicine

## 2023-08-05 ENCOUNTER — Other Ambulatory Visit: Payer: Self-pay | Admitting: Family Medicine

## 2023-08-05 DIAGNOSIS — F1320 Sedative, hypnotic or anxiolytic dependence, uncomplicated: Secondary | ICD-10-CM

## 2023-08-05 DIAGNOSIS — F5101 Primary insomnia: Secondary | ICD-10-CM

## 2023-08-05 NOTE — Telephone Encounter (Signed)
 Last office visit 09/10/2022 for Medicare Wellness.  Last refilled 05/11/2023 for #180 with no refills.  Next appt: CPE 09/14/2023.

## 2023-08-08 ENCOUNTER — Other Ambulatory Visit: Payer: Self-pay | Admitting: Family Medicine

## 2023-08-08 DIAGNOSIS — I10 Essential (primary) hypertension: Secondary | ICD-10-CM

## 2023-08-23 ENCOUNTER — Telehealth: Payer: Self-pay | Admitting: *Deleted

## 2023-08-23 DIAGNOSIS — R7303 Prediabetes: Secondary | ICD-10-CM

## 2023-08-23 DIAGNOSIS — E7800 Pure hypercholesterolemia, unspecified: Secondary | ICD-10-CM

## 2023-08-23 NOTE — Telephone Encounter (Signed)
-----   Message from Veva JINNY Ferrari sent at 08/18/2023  2:33 PM EDT ----- Regarding: Lab orders for Tue, 7.8.25 Patient is scheduled for CPX labs, please order future labs, Thanks , Veva

## 2023-08-24 ENCOUNTER — Other Ambulatory Visit: Payer: Self-pay | Admitting: Family Medicine

## 2023-08-24 DIAGNOSIS — M545 Low back pain, unspecified: Secondary | ICD-10-CM

## 2023-08-24 NOTE — Telephone Encounter (Signed)
 Last office visit 09/10/22 for MWV.  Last refilled 07/13/2023 for #30 with no refills.  Next appt: CPE 09/14/2023.

## 2023-09-07 ENCOUNTER — Ambulatory Visit: Payer: Self-pay | Admitting: Family Medicine

## 2023-09-07 ENCOUNTER — Other Ambulatory Visit (INDEPENDENT_AMBULATORY_CARE_PROVIDER_SITE_OTHER): Payer: Medicare HMO

## 2023-09-07 DIAGNOSIS — R7303 Prediabetes: Secondary | ICD-10-CM | POA: Diagnosis not present

## 2023-09-07 DIAGNOSIS — E7800 Pure hypercholesterolemia, unspecified: Secondary | ICD-10-CM | POA: Diagnosis not present

## 2023-09-07 LAB — COMPREHENSIVE METABOLIC PANEL WITH GFR
ALT: 18 U/L (ref 0–53)
AST: 15 U/L (ref 0–37)
Albumin: 4.2 g/dL (ref 3.5–5.2)
Alkaline Phosphatase: 61 U/L (ref 39–117)
BUN: 24 mg/dL — ABNORMAL HIGH (ref 6–23)
CO2: 30 meq/L (ref 19–32)
Calcium: 8.9 mg/dL (ref 8.4–10.5)
Chloride: 109 meq/L (ref 96–112)
Creatinine, Ser: 1.15 mg/dL (ref 0.40–1.50)
GFR: 65.25 mL/min (ref >60.00)
Glucose, Bld: 106 mg/dL — ABNORMAL HIGH (ref 70–99)
Potassium: 4.4 meq/L (ref 3.5–5.1)
Sodium: 144 meq/L (ref 135–145)
Total Bilirubin: 0.5 mg/dL (ref 0.2–1.2)
Total Protein: 6.4 g/dL (ref 6.0–8.3)

## 2023-09-07 LAB — LIPID PANEL
Cholesterol: 166 mg/dL (ref 0–200)
HDL: 40 mg/dL (ref >39.00)
LDL Cholesterol: 103 mg/dL — ABNORMAL HIGH (ref 0–99)
NonHDL: 125.62
Total CHOL/HDL Ratio: 4
Triglycerides: 111 mg/dL (ref 0.0–149.0)
VLDL: 22.2 mg/dL (ref 0.0–40.0)

## 2023-09-07 LAB — HEMOGLOBIN A1C: Hgb A1c MFr Bld: 5.9 % (ref 4.6–6.5)

## 2023-09-07 NOTE — Progress Notes (Signed)
 No critical labs need to be addressed urgently. We will discuss labs in detail at upcoming office visit.

## 2023-09-09 ENCOUNTER — Other Ambulatory Visit: Payer: Self-pay | Admitting: Family Medicine

## 2023-09-09 DIAGNOSIS — K2100 Gastro-esophageal reflux disease with esophagitis, without bleeding: Secondary | ICD-10-CM

## 2023-09-14 ENCOUNTER — Encounter: Payer: Medicare HMO | Admitting: Family Medicine

## 2023-09-14 ENCOUNTER — Ambulatory Visit (INDEPENDENT_AMBULATORY_CARE_PROVIDER_SITE_OTHER)

## 2023-09-14 VITALS — BP 114/72 | Ht 65.0 in | Wt 220.0 lb

## 2023-09-14 DIAGNOSIS — Z0000 Encounter for general adult medical examination without abnormal findings: Secondary | ICD-10-CM

## 2023-09-14 NOTE — Progress Notes (Signed)
 Because this visit was a virtual/telehealth visit,  certain criteria was not obtained, such a blood pressure, CBG if applicable, and timed get up and go. Any medications not marked as taking were not mentioned during the medication reconciliation part of the visit. Any vitals not documented were not able to be obtained due to this being a telehealth visit or patient was unable to self-report a recent blood pressure reading due to a lack of equipment at home via telehealth. Vitals that have been documented are verbally provided by the patient.  This visit was performed by a medical professional under my direct supervision. I was immediately available for consultation/collaboration. I have reviewed and agree with the Annual Wellness Visit documentation.  Subjective:   Casey [name] is a 68 y.o. who presents for a Medicare Wellness preventive visit.  As a reminder, Annual Wellness Visits don't include a physical exam, and some assessments may be limited, especially if this visit is performed virtually. We may recommend an in-person follow-up visit with your provider if needed.  Visit Complete: Virtual I connected with  KEMONI ORTEGA on 09/14/23 by a audio enabled telemedicine application and verified that I am speaking with the correct person using two identifiers.  Patient Location: Home  Provider Location: Home Office  I discussed the limitations of evaluation and management by telemedicine. The patient expressed understanding and agreed to proceed.  Vital Signs: Because this visit was a virtual/telehealth visit, some criteria may be missing or patient reported. Any vitals not documented were not able to be obtained and vitals that have been documented are patient reported.  VideoDeclined- This patient declined Librarian, academic. Therefore the visit was completed with audio only.  Persons Participating in Visit: Patient.  AWV Questionnaire: No: Patient Medicare  AWV questionnaire was not completed prior to this visit.  Cardiac Risk Factors include: advanced age (>28men, >41 women)     Objective:    Today's Vitals   09/14/23 1601  BP: 114/72  Weight: 220 lb (99.8 kg)  Height: 5' 5 (1.651 m)   Body mass index is 36.61 kg/m.     09/14/2023    4:08 PM 08/21/2022   11:09 AM 08/16/2022   12:49 AM 12/09/2021    3:09 PM 12/09/2021    3:07 PM 10/03/2021    9:26 AM 06/13/2021    6:22 AM  Advanced Directives  Does Patient Have a Medical Advance Directive? No No No No No No No  Would patient like information on creating a medical advance directive? No - Patient declined No - Patient declined No - Patient declined No - Patient declined   No - Guardian declined    Current Medications (verified) Outpatient Encounter Medications as of 09/14/2023  Medication Sig   clonazePAM  (KLONOPIN ) 1 MG tablet TAKE 1-2 TABLETS BY MOUTH AT BEDTIME   hydrochlorothiazide  (HYDRODIURIL ) 12.5 MG tablet Take 1 tablet (12.5 mg total) by mouth daily.   losartan  (COZAAR ) 50 MG tablet TAKE 1 TABLET BY MOUTH EVERY NIGHT AT BEDTIME   pantoprazole  (PROTONIX ) 40 MG tablet TAKE 1 TABLET BY MOUTH DAILY   traMADol  (ULTRAM ) 50 MG tablet TAKE 1 TABLET BY MOUTH UP TO EVERY 6 HOURS AS NEEDED FOR SEVERE PAIN   traZODone  (DESYREL ) 100 MG tablet TAKE 2 TABLETS BY MOUTH AT BEDTIME   acetaminophen  (TYLENOL ) 500 MG tablet Take 1,000 mg by mouth 2 (two) times daily.   albuterol  (VENTOLIN  HFA) 108 (90 Base) MCG/ACT inhaler Inhale 2 puffs into the lungs every  4 (four) hours as needed for wheezing or shortness of breath.   cetirizine  (ZYRTEC ) 10 MG tablet Take 10 mg by mouth daily.   docusate sodium  (COLACE) 100 MG capsule Take 1 tablet once or twice daily as needed for constipation while taking narcotic pain medicine   ondansetron  (ZOFRAN -ODT) 4 MG disintegrating tablet Allow 1-2 tablets to dissolve in your mouth every 8 hours as needed for nausea/vomiting   tamsulosin  (FLOMAX ) 0.4 MG CAPS  capsule Take 1 tablet by mouth daily until you pass the kidney stone or no longer have symptoms   No facility-administered encounter medications on file as of 09/14/2023.    Allergies (verified) Prednisone , Codeine, and Hydrocodone   History: Past Medical History:  Diagnosis Date   Arthritis    mild   Cardiac arrest (HCC) 02/26/2018   x 1 minute no current cardiac issues per pt on 06-10-2021   CKD (chronic kidney disease) stage 2, GFR 60-89 ml/min    no nephrologist, managed by pcp   Complication of anesthesia    lying flat hurts back   GERD (gastroesophageal reflux disease)    Goiter    right lobe 4.7 x2.2 x 2.2 cm, left lobe 4.4x 1.3x 1.3 cm, per us  thryoid 05-13-2021 epic,  sees dr todd gerkins at [facility] may 2023 per pt   History of COVID-19 01/01/2021   mild all symptoms resolved   History of kidney stones    History of sleep apnea    no cpap needed since normal sleep study done last 2 times yrs ago per pt on 06-13-2021   Hypertension    Melanoma (HCC) 2008   no recurrence   PONV (postoperative nausea and vomiting)    pt prefers iv meds for ponv   Wears glasses for reading    Past Surgical History:  Procedure Laterality Date   acl repair left knee     yrs ago   CARDIAC CATHETERIZATION  2005   negative    CHOLECYSTECTOMY     yrs ago   colonscopy  05/14/2014   CYSTOSCOPY     multiple cystoscopies done in past none recent per pt on 06-10-2021   CYSTOSCOPY/RETROGRADE/URETEROSCOPY/STONE EXTRACTION WITH BASKET  04/07/2012   Procedure: CYSTOSCOPY/RETROGRADE/URETEROSCOPY/STONE EXTRACTION WITH BASKET;  Surgeon: Norleen JINNY Seltzer, MD;  Location: [facility];  Service: Urology;  Laterality: Bilateral;  cystoscopy, bilateral stent removal, bilateral ureteroscopy with bilateral stone extraction and insertion of right ureteral stent   EXTRACORPOREAL SHOCK WAVE LITHOTRIPSY Right 08/21/2022   Procedure: RIGHT EXTRACORPOREAL SHOCK WAVE LITHOTRIPSY (ESWL);  Surgeon: Seltzer Norleen, MD;  Location:  [facility];  Service: Urology;  Laterality: Right;   fusion great toe right foot     2010   GOLD SEED IMPLANT N/A 06/13/2021   Procedure: GOLD SEED IMPLANT;  Surgeon: Seltzer Norleen, MD;  Location: [facility];  Service: Urology;  Laterality: N/A;  30 MINS FOR THIS CASE   left rotator cuff repair & biceps tendon repair  2003   ligaments repair left wrist      2006   mohs prodecure  2008   left cheek   SPACE OAR INSTILLATION N/A 06/13/2021   Procedure: SPACE OAR INSTILLATION;  Surgeon: Seltzer Norleen, MD;  Location: [facility];  Service: Urology;  Laterality: N/A;   stone extractions and urethral stents     multiple   tendons cut on left heel     for bone spur removal done yrs ago   TONSILLECTOMY  as child   TYMPANOSTOMY TUBE PLACEMENT     UPPER GI ENDOSCOPY     not sure when last done per pt on 06-10-2021   VARICOCELE EXCISION     VASECTOMY  1979   Family History  Problem Relation Age of Onset   Heart disease Mother        CABG   Bladder Cancer Mother        bladder cancer   Hypertension Father    Lung cancer Father        lung cancer after asbestosis   Diabetes Brother    Alcohol abuse Brother    Depression Brother    Hypertension Brother    Kidney disease Brother    Heart disease Maternal Grandmother    Heart attack Maternal Grandfather    Stroke Maternal Grandfather    Bone cancer Paternal Grandfather    Colon cancer Neg Hx    Social History   Socioeconomic History   Marital status: Married    Spouse name: Dorthea   Number of children: 2   Years of education: college   Highest education level: Not on file  Occupational History   Occupation: Curator 1    Employer: [employer]    Comment: Field seismologist  Tobacco Use   Smoking status: Never   Smokeless tobacco: Never  Vaping Use   Vaping status: Never Used  Substance and Sexual Activity   Alcohol use: No   Drug use: Never   Sexual  activity: Yes    Birth control/protection: Surgical  Other Topics Concern   Not on file  Social History Narrative   08/15/19   From: the area   Living: with wife, Dorthea (1975)   Work: Retired from [employer], works as a prn Programmer, multimedia      Family: 2 children - Garen Raddle and [place] - 3 grandsons      Enjoys: golf, fish (when back doesn't bother him)      Exercise: stretches when back is good   Diet: low carb to try to lose weight      Safety   Seat belts: Yes    Guns: Yes  and not secure   Safe in relationships: Yes    Social Drivers of Corporate investment banker Strain: Low Risk  (09/14/2023)   Overall Financial Resource Strain (CARDIA)    Difficulty of Paying Living Expenses: Not hard at all  Food Insecurity: No Food Insecurity (09/14/2023)   Hunger Vital Sign    Worried About Running Out of Food in the Last Year: Never true    Ran Out of Food in the Last Year: Never true  Transportation Needs: No Transportation Needs (09/14/2023)   PRAPARE - Administrator, Civil Service (Medical): No    Lack of Transportation (Non-Medical): No  Physical Activity: Sufficiently Active (09/14/2023)   Exercise Vital Sign    Days of Exercise per Week: 7 days    Minutes of Exercise per Session: 70 min  Stress: No Stress Concern Present (09/14/2023)   [employer] of Occupational Health - Occupational Stress Questionnaire    Feeling of Stress: Not at all  Social Connections: Socially Integrated (09/14/2023)   Social Connection and Isolation Panel    Frequency of Communication with Friends and Family: More than three times a week    Frequency of Social Gatherings with Friends and Family: More than three times a week    Attends Religious Services: More than 4 times  per year    Active Member of Clubs or Organizations: Yes    Attends Banker Meetings: More than 4 times per year    Marital Status: Married    Tobacco Counseling Counseling given: Not  Answered    Clinical Intake:  Pre-visit preparation completed: Yes  Pain : No/denies pain     BMI - recorded: 36.61 Nutritional Status: BMI > 30  Obese Nutritional Risks: None Diabetes: No  Lab Results  Component Value Date   HGBA1C 5.9 09/07/2023   HGBA1C 5.7 09/07/2022   HGBA1C 5.4 12/26/2020     How often do you need to have someone help you when you read instructions, pamphlets, or other written materials from your doctor or pharmacy?: 1 - Never     Information entered by :: [email or web address]   Activities of Daily Living     09/14/2023    4:06 PM  In your present state of health, do you have any difficulty performing the following activities:  Hearing? 0  Vision? 0  Difficulty concentrating or making decisions? 0  Walking or climbing stairs? 0  Dressing or bathing? 0  Doing errands, shopping? 0  Preparing Food and eating ? N  Using the Toilet? N  In the past six months, have you accidently leaked urine? N  Do you have problems with loss of bowel control? N  Managing your Medications? N  Managing your Finances? N  Housekeeping or managing your Housekeeping? N    Patient Care Team: Avelina Greig BRAVO, MD as PCP - General (Family Medicine) Pyrtle, Gordy HERO, MD as Consulting Physician (Gastroenterology) Lucio Franky PARAS, OD (Optometry) Watt Rush, MD as Attending Physician (Urology) Patrcia Cough, MD as Consulting Physician (Radiation Oncology) Eletha Boas, MD as Consulting Physician (General Surgery)  I have updated your Care Teams any recent Medical Services you may have received from other providers in the past year.     Assessment:   This is a routine wellness examination for Lonzy.  Hearing/Vision screen Hearing Screening - Comments:: No hearing aids Vision Screening - Comments:: Pt wears glasses   Goals Addressed             This Visit's Progress    Patient Stated       Lose a little weight       Depression Screen     09/14/2023     4:08 PM 09/10/2022    3:06 PM 04/08/2022   12:09 PM 01/22/2021   12:01 PM 12/26/2020    2:47 PM 01/11/2020   11:50 AM 12/09/2018    1:57 PM  PHQ 2/9 Scores  PHQ - 2 Score 0 0 1 0 0 0 0  PHQ- 9 Score 3  9        Fall Risk     09/14/2023    4:06 PM 09/10/2022    3:05 PM 04/08/2022   12:09 PM 01/22/2021   12:00 PM 12/26/2020    2:47 PM  Fall Risk   Falls in the past year? 1 0 0 0 0  Number falls in past yr: 1 0 0 0 0  Injury with Fall? 0 0 0 0 0  Risk for fall due to : History of fall(s) No Fall Risks No Fall Risks No Fall Risks Orthopedic patient  Follow up Falls evaluation completed Falls evaluation completed Falls evaluation completed Falls prevention discussed  Falls evaluation completed      Data saved with a previous flowsheet row definition  MEDICARE RISK AT HOME:  Medicare Risk at Home Any stairs in or around the home?: No If so, are there any without handrails?: No Home free of loose throw rugs in walkways, pet beds, electrical cords, etc?: Yes Adequate lighting in your home to reduce risk of falls?: Yes Life alert?: No Use of a cane, walker or w/c?: No Grab bars in the bathroom?: No Shower chair or bench in shower?: No Elevated toilet seat or a handicapped toilet?: Yes  TIMED UP AND GO:  Was the test performed?  No  Cognitive Function: 6CIT completed        09/14/2023    4:05 PM  6CIT Screen  What Year? 0 points  What month? 0 points  What time? 0 points  Count back from 20 0 points  Months in reverse 0 points  Repeat phrase 0 points  Total Score 0 points    Immunizations Immunization History  Administered Date(s) Administered   Fluad Quad(high Dose 65+) 12/26/2020, 03/03/2022   Influenza Inj Mdck Quad Pf 12/23/2016   Influenza Split 03/02/2014, 11/30/2016   Influenza Whole 12/21/2011   Influenza,inj,Quad PF,6+ Mos 12/08/2012, 11/06/2014, 01/18/2016, 12/30/2017, 10/27/2018, 12/14/2019   PFIZER(Purple Top)SARS-COV-2 Vaccination 05/25/2019,  06/20/2019, 12/28/2019, 08/01/2020   PNEUMOCOCCAL CONJUGATE-20 08/01/2020   Pfizer Covid-19 Vaccine Bivalent Booster 62yrs & up 03/18/2021   Pneumococcal Conjugate-13 01/11/2020   Pneumococcal Polysaccharide-23 05/09/2015   Tdap 04/30/2009, 03/19/2013   Zoster Recombinant(Shingrix) 10/27/2018, 07/22/2019   Zoster, Live 01/01/2016    Screening Tests Health Maintenance  Topic Date Due   COVID-19 Vaccine (6 - 2024-25 season) 11/01/2022   DTaP/Tdap/Td (3 - Td or Tdap) 03/20/2023   INFLUENZA VACCINE  10/01/2023   Colonoscopy  05/13/2024   Medicare Annual Wellness (AWV)  09/13/2024   Pneumococcal Vaccine: 50+ Years  Completed   Hepatitis C Screening  Completed   Zoster Vaccines- Shingrix  Completed   Hepatitis B Vaccines  Aged Out   HPV VACCINES  Aged Out   Meningococcal B Vaccine  Aged Out    Health Maintenance  Health Maintenance Due  Topic Date Due   COVID-19 Vaccine (6 - 2024-25 season) 11/01/2022   DTaP/Tdap/Td (3 - Td or Tdap) 03/20/2023   Health Maintenance Items Addressed:   Additional Screening:  Vision Screening: Recommended annual ophthalmology exams for early detection of glaucoma and other disorders of the eye. Would you like a referral to an eye doctor? No    Dental Screening: Recommended annual dental exams for proper oral hygiene  Community Resource Referral / Chronic Care Management: CRR required this visit?  No   CCM required this visit?  No   Plan:    I have personally reviewed and noted the following in the patient's chart:   Medical and social history Use of alcohol, tobacco or illicit drugs  Current medications and supplements including opioid prescriptions. Patient is not currently taking opioid prescriptions. Functional ability and status Nutritional status Physical activity Advanced directives List of other physicians Hospitalizations, surgeries, and ER visits in previous 12 months Vitals Screenings to include cognitive, depression,  and falls Referrals and appointments  In addition, I have reviewed and discussed with patient certain preventive protocols, quality metrics, and best practice recommendations. A written personalized care plan for preventive services as well as general preventive health recommendations were provided to patient.   Lyle MARLA Right, [place]   09/14/2023   After Visit Summary: (MyChart) Due to this being a telephonic visit, the after visit summary with patients personalized plan was  offered to patient via MyChart   Notes: Nothing significant to report at this time.

## 2023-09-14 NOTE — Patient Instructions (Signed)
 Casey [name] , Thank you for taking time out of your busy schedule to complete your Annual Wellness Visit with me. I enjoyed our conversation and look forward to speaking with you again next year. I, as well as your care team,  appreciate your ongoing commitment to your health goals. Please review the following plan we discussed and let me know if I can assist you in the future. Your Game plan/ To Do List    Referrals: If you haven't heard from the office you've been referred to, please reach out to them at the phone provided.   Follow up Visits: Next Medicare AWV with our clinical staff: patient declined    Have you seen your provider in the last 6 months (3 months if uncontrolled diabetes)? No Next Office Visit with your provider: n/a  Clinician Recommendations:  Aim for 30 minutes of exercise or brisk walking, 6-8 glasses of water, and 5 servings of fruits and vegetables each day.       This is a list of the screening recommended for you and due dates:  Health Maintenance  Topic Date Due   COVID-19 Vaccine (6 - 2024-25 season) 11/01/2022   DTaP/Tdap/Td vaccine (3 - Td or Tdap) 03/20/2023   Flu Shot  10/01/2023   Colon Cancer Screening  05/13/2024   Medicare Annual Wellness Visit  09/13/2024   Pneumococcal Vaccine for age over 77  Completed   Hepatitis C Screening  Completed   Zoster (Shingles) Vaccine  Completed   Hepatitis B Vaccine  Aged Out   HPV Vaccine  Aged Out   Meningitis B Vaccine  Aged Out    Advanced directives: (Declined) Advance directive discussed with you today. Even though you declined this today, please call our office should you change your mind, and we can give you the proper paperwork for you to fill out. Advance Care Planning is important because it:  [x]  Makes sure you receive the medical care that is consistent with your values, goals, and preferences  [x]  It provides guidance to your family and loved ones and reduces their decisional burden about whether or  not they are making the right decisions based on your wishes.  Follow the link provided in your after visit summary or read over the paperwork we have mailed to you to help you started getting your Advance Directives in place. If you need assistance in completing these, please reach out to us  so that we can help you!  See attachments for Preventive Care and Fall Prevention Tips.

## 2023-09-17 ENCOUNTER — Ambulatory Visit (INDEPENDENT_AMBULATORY_CARE_PROVIDER_SITE_OTHER): Admitting: Family Medicine

## 2023-09-17 ENCOUNTER — Encounter: Payer: Self-pay | Admitting: Family Medicine

## 2023-09-17 ENCOUNTER — Ambulatory Visit
Admission: RE | Admit: 2023-09-17 | Discharge: 2023-09-17 | Disposition: A | Discharge status: Home / Self Care | Source: Ambulatory Visit | Attending: Family Medicine | Admitting: Family Medicine

## 2023-09-17 VITALS — BP 110/80 | HR 69 | Temp 97.7°F | Ht 64.75 in | Wt 225.4 lb

## 2023-09-17 DIAGNOSIS — C61 Malignant neoplasm of prostate: Secondary | ICD-10-CM | POA: Diagnosis not present

## 2023-09-17 DIAGNOSIS — I10 Essential (primary) hypertension: Secondary | ICD-10-CM | POA: Diagnosis not present

## 2023-09-17 DIAGNOSIS — E7800 Pure hypercholesterolemia, unspecified: Secondary | ICD-10-CM

## 2023-09-17 DIAGNOSIS — Z0000 Encounter for general adult medical examination without abnormal findings: Secondary | ICD-10-CM

## 2023-09-17 DIAGNOSIS — R7303 Prediabetes: Secondary | ICD-10-CM | POA: Diagnosis not present

## 2023-09-17 DIAGNOSIS — M545 Low back pain, unspecified: Secondary | ICD-10-CM | POA: Diagnosis not present

## 2023-09-17 DIAGNOSIS — F1320 Sedative, hypnotic or anxiolytic dependence, uncomplicated: Secondary | ICD-10-CM

## 2023-09-17 DIAGNOSIS — M48061 Spinal stenosis, lumbar region without neurogenic claudication: Secondary | ICD-10-CM | POA: Diagnosis not present

## 2023-09-17 NOTE — Progress Notes (Signed)
 Patient ID: Casey [name], male    DOB: 1955-05-02, 67 y.o.   MRN: [id number]  This visit was conducted in person.  BP (!) 140/84   Pulse 69   Temp 97.7 F (36.5 C) (Temporal)   Ht 5' 4.75 (1.645 m)   Wt 225 lb 6 oz (102.2 kg)   SpO2 96%   BMI 37.79 kg/m    CC:  Chief Complaint  Patient presents with   Annual Exam    MWV 09/14/2023    Subjective:   HPI: Casey [name] is a 68 y.o. male presenting on 09/17/2023 for Annual Exam (MWV 09/14/2023)   The patient presents for  complete physical and review of chronic health problems. He/She also has the following acute concerns today:   2 month change of chronic low back pain... no central in low, no fall, no new numbness or weakness.  No radiation of pain.  Using tramadol  off and on.. also using ibuprofen.  The patient saw a LPN or RN for medicare wellness visit.  Prevention and wellness was reviewed in detail. Note reviewed and important notes copied below.  Flowsheet Row Clinical Support from 09/14/2023 in [facility] [place]  PHQ-2 Total Score 0     Prostate cancer: Diagnosed 2022 stage IIc Followed by Dr. Watt [facility] reviewed last office visit January 21, 2022 S/P radiation and hormone treatment.SABRA stopped given SE.  S/P passing kidney stones 3 weeks ago.  Hypertension:  Borderline control in office today on losartan  50 mg p.o. daily and HCTZ 12.5 mg daily BP Readings from Last 3 Encounters:  09/17/23 (!) 140/84  09/14/23 114/72  09/10/22 114/72  Using medication without problems or lightheadedness:  none Chest pain with exertion: none Edema: none Short of breath: some from deconditioning. Average home BPs: 136/78 Other issues:  GERD, well-controlled on pantoprazole  40 mg p.o. daily    Chronic insomnia: Klonopin  1 mg 1 to 2 tablets daily at bedtime Trazodone  200 mg p.o. nightly Flowsheet Row Clinical Support from 09/14/2023 in [facility]  PHQ-2  Total Score 0   Chronic back pain tramadol  1 tablet twice daily as needed for pain.  Using about once daily. PDMP reviewed during this encounter.   High cholesterol.. Family history of  CAD  Cardiac calcium score 2021 0 Lab Results  Component Value Date   CHOL 166 09/07/2023   HDL 40.00 09/07/2023   LDLCALC 103 (H) 09/07/2023   TRIG 111.0 09/07/2023   CHOLHDL 4 09/07/2023   The 10-year ASCVD risk score (Arnett DK, et al., 2019) is: 21.1%   Values used to calculate the score:     Age: 66 years     Clincally relevant sex: Male     Is Non-Hispanic African American: No     Diabetic: No     Tobacco smoker: No     Systolic Blood Pressure: 140 mmHg     Is BP treated: Yes     HDL Cholesterol: 40 mg/dL     Total Cholesterol: 166 mg/dL  Prediabetes  Lab Results  Component Value Date   HGBA1C 5.9 09/07/2023   Walking for exercsie daily  Body mass index is 37.79 kg/m.  Relevant past medical, surgical, family and social history reviewed and updated as indicated. Interim medical history since our last visit reviewed. Allergies and medications reviewed and updated. Outpatient Medications Prior to Visit  Medication Sig Dispense Refill   clonazePAM  (KLONOPIN ) 1 MG tablet  TAKE 1-2 TABLETS BY MOUTH AT BEDTIME 180 tablet 0   hydrochlorothiazide  (HYDRODIURIL ) 12.5 MG tablet Take 1 tablet (12.5 mg total) by mouth daily. 90 tablet 3   losartan  (COZAAR ) 50 MG tablet TAKE 1 TABLET BY MOUTH EVERY NIGHT AT BEDTIME 90 tablet 0   naproxen  sodium (ALEVE ) 220 MG tablet Take 440 mg by mouth in the morning and at bedtime.     pantoprazole  (PROTONIX ) 40 MG tablet TAKE 1 TABLET BY MOUTH DAILY 90 tablet 0   traMADol  (ULTRAM ) 50 MG tablet TAKE 1 TABLET BY MOUTH UP TO EVERY 6 HOURS AS NEEDED FOR SEVERE PAIN 30 tablet 0   traZODone  (DESYREL ) 100 MG tablet TAKE 2 TABLETS BY MOUTH AT BEDTIME 180 tablet 0   acetaminophen  (TYLENOL ) 500 MG tablet Take 1,000 mg by mouth 2 (two) times daily.     albuterol   (VENTOLIN  HFA) 108 (90 Base) MCG/ACT inhaler Inhale 2 puffs into the lungs every 4 (four) hours as needed for wheezing or shortness of breath. 8 g 0   cetirizine  (ZYRTEC ) 10 MG tablet Take 10 mg by mouth daily.     docusate sodium  (COLACE) 100 MG capsule Take 1 tablet once or twice daily as needed for constipation while taking narcotic pain medicine 30 capsule 0   ondansetron  (ZOFRAN -ODT) 4 MG disintegrating tablet Allow 1-2 tablets to dissolve in your mouth every 8 hours as needed for nausea/vomiting 15 tablet 1   tamsulosin  (FLOMAX ) 0.4 MG CAPS capsule Take 1 tablet by mouth daily until you pass the kidney stone or no longer have symptoms 14 capsule 0   No facility-administered medications prior to visit.     Per HPI unless specifically indicated in ROS section below Review of Systems  Constitutional:  Negative for fatigue and fever.  HENT:  Negative for ear pain.   Eyes:  Negative for pain.  Respiratory:  Negative for cough and shortness of breath.   Cardiovascular:  Negative for chest pain, palpitations and leg swelling.  Gastrointestinal:  Negative for abdominal pain.  Genitourinary:  Negative for dysuria.  Musculoskeletal:  Negative for arthralgias.  Neurological:  Negative for syncope, light-headedness and headaches.  Psychiatric/Behavioral:  Negative for dysphoric mood.    Objective:  BP (!) 140/84   Pulse 69   Temp 97.7 F (36.5 C) (Temporal)   Ht 5' 4.75 (1.645 m)   Wt 225 lb 6 oz (102.2 kg)   SpO2 96%   BMI 37.79 kg/m   Wt Readings from Last 3 Encounters:  09/17/23 225 lb 6 oz (102.2 kg)  09/14/23 220 lb (99.8 kg)  09/10/22 226 lb 8 oz (102.7 kg)      Physical Exam Constitutional:      Appearance: He is well-developed.  HENT:     Head: Normocephalic.     Right Ear: Hearing normal.     Left Ear: Hearing normal.     Nose: Nose normal.  Neck:     Thyroid : No thyroid  mass or thyromegaly.     Vascular: No carotid bruit.     Trachea: Trachea normal.   Cardiovascular:     Rate and Rhythm: Normal rate and regular rhythm.     Pulses: Normal pulses.     Heart sounds: Heart sounds not distant. No murmur heard.    No friction rub. No gallop.     Comments: No peripheral edema Pulmonary:     Effort: Pulmonary effort is normal. No respiratory distress.     Breath sounds: Normal breath sounds.  Skin:    General: Skin is warm and dry.     Findings: No rash.  Psychiatric:        Speech: Speech normal.        Behavior: Behavior normal.        Thought Content: Thought content normal.       Results for orders placed or performed in visit on 09/07/23  Comprehensive metabolic panel   Collection Time: 09/07/23  9:34 AM  Result Value Ref Range   Sodium 144 135 - 145 mEq/L   Potassium 4.4 3.5 - 5.1 mEq/L   Chloride 109 96 - 112 mEq/L   CO2 30 19 - 32 mEq/L   Glucose, Bld 106 (H) 70 - 99 mg/dL   BUN 24 (H) 6 - 23 mg/dL   Creatinine, Ser 8.84 0.40 - 1.50 mg/dL   Total Bilirubin 0.5 0.2 - 1.2 mg/dL   Alkaline Phosphatase 61 39 - 117 U/L   AST 15 0 - 37 U/L   ALT 18 0 - 53 U/L   Total Protein 6.4 6.0 - 8.3 g/dL   Albumin 4.2 3.5 - 5.2 g/dL   GFR 34.74 >39.99 mL/min   Calcium 8.9 8.4 - 10.5 mg/dL  Lipid panel   Collection Time: 09/07/23  9:34 AM  Result Value Ref Range   Cholesterol 166 0 - 200 mg/dL   Triglycerides 888.9 0.0 - 149.0 mg/dL   HDL 59.99 >60.99 mg/dL   VLDL 77.7 0.0 - 59.9 mg/dL   LDL Cholesterol 896 (H) 0 - 99 mg/dL   Total CHOL/HDL Ratio 4    NonHDL 125.62   Hemoglobin A1c   Collection Time: 09/07/23  9:34 AM  Result Value Ref Range   Hgb A1c MFr Bld 5.9 4.6 - 6.5 %    Assessment and Plan The patient's preventative maintenance and recommended screening tests for an annual wellness exam were reviewed in full today. Brought up to date unless services declined.  Counselled on the importance of diet, exercise, and its role in overall health and mortality. The patient's FH and SH was reviewed, including their home  life, tobacco status, and drug and alcohol status.    Vaccines: Up-to-date with tetanus Prostate Cancer Screen: Prostate cancer history followed by urology Colon Cancer Screen: Last colonoscopy May 14, 2014, repeat recommended in 10 years      Smoking Status: Non-smoker ETOH/ drug use: None/none  Hep C:  done   Routine general medical examination at a health care facility  Primary hypertension Assessment & Plan: Stable, chronic.  Continue current medication.  Losartan  50 mg p.o. daily HCTZ 12.5 mg daily   Hypercholesterolemia Assessment & Plan: Chronic, elevated cardiovascular disease 10-year risk at 14.9%, but CCS 0 in 2021   Recommended statin medication.... he is not interested despite risk level.  Will continue to work on low chol diet and lifestyle changes.   Prediabetes Assessment & Plan:  Chronic,  Encouraged exercise, weight loss, healthy eating habits.  Stop sweet tea.     Acute midline low back pain without sciatica Assessment & Plan:  Acute  flare of chronic issue.. now different with focal vertebral ttp.  Will eval with X-ray given vertebral pain in setting of prostate cancer ( not on hormonal suppression) and start home PT.. info given.  Follow up if not improving as expected.  Orders: -     DG Lumbar Spine Complete; Future  Prostate cancer (HCC) -     DG Lumbar Spine Complete; Future  Benzodiazepine dependence (  HCC) Assessment & Plan:  No abuse but unable to wean off.      Return in about 3 months (around 12/18/2023) for lab only lipid.   Greig Ring, MD

## 2023-09-17 NOTE — Assessment & Plan Note (Signed)
Stable, chronic.  Continue current medication.  Losartan 50 mg p.o. daily HCTZ 12.5 mg daily 

## 2023-09-17 NOTE — Assessment & Plan Note (Signed)
 Acute  flare of chronic issue.. now different with focal vertebral ttp.  Will eval with X-ray given vertebral pain in setting of prostate cancer ( not on hormonal suppression) and start home PT.. info given.  Follow up if not improving as expected.

## 2023-09-17 NOTE — Assessment & Plan Note (Signed)
No abuse but unable to wean off.

## 2023-09-17 NOTE — Assessment & Plan Note (Addendum)
 Chronic, elevated cardiovascular disease 10-year risk at 14.9%, but CCS 0 in 2021   Recommended statin medication.... he is not interested despite risk level.  Will continue to work on low chol diet and lifestyle changes.

## 2023-09-17 NOTE — Assessment & Plan Note (Signed)
 Chronic,  Encouraged exercise, weight loss, healthy eating habits.  Stop sweet tea.

## 2023-09-28 ENCOUNTER — Ambulatory Visit: Payer: Self-pay | Admitting: Family Medicine

## 2023-10-10 ENCOUNTER — Other Ambulatory Visit: Payer: Self-pay | Admitting: Family Medicine

## 2023-10-12 ENCOUNTER — Other Ambulatory Visit: Payer: Self-pay | Admitting: Family Medicine

## 2023-10-12 DIAGNOSIS — G8929 Other chronic pain: Secondary | ICD-10-CM

## 2023-10-12 MED ORDER — TRAMADOL HCL 50 MG PO TABS
ORAL_TABLET | ORAL | 0 refills | Status: DC
Start: 2023-10-12 — End: 2023-12-28

## 2023-10-12 NOTE — Telephone Encounter (Signed)
 Last office visit 09/17/23 for CPE.  Last refilled 08/24/23 for #30 with no refills.  Next Appt: No future appointments.

## 2023-10-12 NOTE — Telephone Encounter (Signed)
 Copied from CRM [id number]. Topic: Clinical - Medication Refill >> Oct 12, 2023  1:42 PM Paige D wrote: Medication:  traMADol  (ULTRAM ) 50 MG tablet    Has the patient contacted their pharmacy? Yes (Agent: If no, request that the patient contact the pharmacy for the refill. If patient does not wish to contact the pharmacy document the reason why and proceed with request.) (Agent: If yes, when and what did the pharmacy advise?)  This is the patient's preferred pharmacy:  [id number] [place], [place] - [address] [place] [address] Phone: [phone] Fax: [phone]   Is this the correct pharmacy for this prescription? Yes If no, delete pharmacy and type the correct one.   Has the prescription been filled recently? No, been over 30 days   Is the patient out of the medication? Yes  Has the patient been seen for an appointment in the last year OR does the patient have an upcoming appointment? Yes  Can we respond through MyChart? Yes  Agent: Please be advised that Rx refills may take up to 3 business days. We ask that you follow-up with your pharmacy.

## 2023-10-27 ENCOUNTER — Other Ambulatory Visit: Payer: Self-pay | Admitting: Family Medicine

## 2023-11-04 ENCOUNTER — Other Ambulatory Visit: Payer: Self-pay | Admitting: Family Medicine

## 2023-11-04 DIAGNOSIS — I10 Essential (primary) hypertension: Secondary | ICD-10-CM

## 2023-11-09 ENCOUNTER — Other Ambulatory Visit: Payer: Self-pay | Admitting: Family Medicine

## 2023-11-09 DIAGNOSIS — F5101 Primary insomnia: Secondary | ICD-10-CM

## 2023-11-09 DIAGNOSIS — F1320 Sedative, hypnotic or anxiolytic dependence, uncomplicated: Secondary | ICD-10-CM

## 2023-11-09 NOTE — Telephone Encounter (Signed)
 Refill Request for Clonazepam  1mg  180tab; Last fill 08/05/23; Last OV 09/17/23

## 2023-12-04 ENCOUNTER — Other Ambulatory Visit: Payer: Self-pay | Admitting: Family Medicine

## 2023-12-04 DIAGNOSIS — K2100 Gastro-esophageal reflux disease with esophagitis, without bleeding: Secondary | ICD-10-CM

## 2023-12-28 ENCOUNTER — Other Ambulatory Visit: Payer: Self-pay | Admitting: Family Medicine

## 2023-12-28 DIAGNOSIS — M545 Low back pain, unspecified: Secondary | ICD-10-CM

## 2023-12-28 NOTE — Telephone Encounter (Signed)
 Last office visit 09/17/2023 for CPE.  Last refilled 10/12/2023 for #30 with no refills.  Next Appt: No future appointments.

## 2024-01-18 DIAGNOSIS — L820 Inflamed seborrheic keratosis: Secondary | ICD-10-CM | POA: Diagnosis not present

## 2024-01-18 DIAGNOSIS — Z85828 Personal history of other malignant neoplasm of skin: Secondary | ICD-10-CM | POA: Diagnosis not present

## 2024-01-18 DIAGNOSIS — L738 Other specified follicular disorders: Secondary | ICD-10-CM | POA: Diagnosis not present

## 2024-01-18 DIAGNOSIS — D2261 Melanocytic nevi of right upper limb, including shoulder: Secondary | ICD-10-CM | POA: Diagnosis not present

## 2024-03-09 ENCOUNTER — Encounter (HOSPITAL_COMMUNITY)
Admission: RE | Admit: 2024-03-09 | Discharge: 2024-03-09 | Disposition: A | Discharge status: Home / Self Care | Source: Ambulatory Visit | Attending: Urology | Admitting: Urology

## 2024-03-09 ENCOUNTER — Other Ambulatory Visit: Payer: Self-pay | Admitting: Urology

## 2024-03-09 ENCOUNTER — Other Ambulatory Visit: Payer: Self-pay

## 2024-03-09 ENCOUNTER — Encounter (HOSPITAL_COMMUNITY): Payer: Self-pay

## 2024-03-09 DIAGNOSIS — Z01818 Encounter for other preprocedural examination: Secondary | ICD-10-CM

## 2024-03-09 NOTE — Patient Instructions (Signed)
 SURGICAL WAITING ROOM VISITATION  Patients having surgery or a procedure may have no more than 2 support people in the waiting area - these visitors may rotate.    Children ages 84 and under will not be able to visit patients in [facility] under most circumstances.   Visitors with respiratory illnesses are discouraged from visiting and should remain at home.  If the patient needs to stay at the hospital during part of their recovery, the visitor guidelines for inpatient rooms apply. Pre-op nurse will coordinate an appropriate time for 1 support person to accompany patient in pre-op.  This support person may not rotate.    Please refer to the [facility] website for the visitor guidelines for Inpatients (after your surgery is over and you are in a regular room).       Your procedure is scheduled on:  03/10/2024    Report to [facility] Main Entrance    Report to admitting at   200 pm    Call this number if you have problems the morning of surgery [phone]   Do not eat food :After Midnight.   After Midnight you may have the following liquids until  130 pm ______   DAY OF SURGERY  Water Non-Citrus Juices (without pulp, NO RED-Apple, White grape, White cranberry) Black Coffee (NO MILK/CREAM OR CREAMERS, sugar ok)  Clear Tea (NO MILK/CREAM OR CREAMERS, sugar ok) regular and decaf                             Plain Jell-O (NO RED)                                           Fruit ices (not with fruit pulp, NO RED)                                     Popsicles (NO RED)                                                               Sports drinks like Gatorade (NO RED)                          If you have questions, please contact your surgeons office.      Oral Hygiene is also important to reduce your risk of infection.                                    Remember - BRUSH YOUR TEETH THE MORNING OF SURGERY WITH YOUR REGULAR TOOTHPASTE  DENTURES WILL BE REMOVED  PRIOR TO SURGERY PLEASE DO NOT APPLY Poly grip OR ADHESIVES!!!   Do NOT smoke after Midnight   Stop all vitamins and herbal supplements 7 days before surgery.   Take these medicines the morning of surgery with A SIP OF WATER:    DO NOT TAKE ANY ORAL DIABETIC MEDICATIONS DAY OF YOUR SURGERY  Bring CPAP  mask and tubing day of surgery.                              You may not have any metal on your body including hair pins, jewelry, and body piercing             Do not wear make-up, lotions, powders, perfumes/cologne, or deodorant  Do not wear nail polish including gel and S&S, artificial/acrylic nails, or any other type of covering on natural nails including finger and toenails. If you have artificial nails, gel coating, etc. that needs to be removed by a nail salon please have this removed prior to surgery or surgery may need to be canceled/ delayed if the surgeon/ anesthesia feels like they are unable to be safely monitored.   Do not shave  48 hours prior to surgery.               Men may shave face and neck.   Do not bring valuables to the hospital. [facility] IS NOT             RESPONSIBLE   FOR VALUABLES.   Contacts, glasses, dentures or bridgework may not be worn into surgery.   Bring small overnight bag day of surgery.   DO NOT BRING YOUR HOME MEDICATIONS TO THE HOSPITAL. PHARMACY WILL DISPENSE MEDICATIONS LISTED ON YOUR MEDICATION LIST TO YOU DURING YOUR ADMISSION IN THE HOSPITAL!    Patients discharged on the day of surgery will not be allowed to drive home.  Someone NEEDS to stay with you for the first 24 hours after anesthesia.   Special Instructions: Bring a copy of your healthcare power of attorney and living will documents the day of surgery if you haven't scanned them before.              Please read over the following fact sheets you were given: IF YOU HAVE QUESTIONS ABOUT YOUR PRE-OP INSTRUCTIONS PLEASE CALL [phone].   If you received a COVID test during your  pre-op visit  it is requested that you wear a mask when out in public, stay away from anyone that may not be feeling well and notify your surgeon if you develop symptoms. If you test positive for Covid or have been in contact with anyone that has tested positive in the last 10 days please notify you surgeon.    [facility] - Preparing for Surgery Before surgery, you can play an important role.  Because skin is not sterile, your skin needs to be as free of germs as possible.  You can reduce the number of germs on your skin by washing with CHG (chlorahexidine gluconate) soap before surgery.  CHG is an antiseptic cleaner which kills germs and bonds with the skin to continue killing germs even after washing. Please DO NOT use if you have an allergy to CHG or antibacterial soaps.  If your skin becomes reddened/irritated stop using the CHG and inform your nurse when you arrive at Short Stay. Do not shave (including legs and underarms) for at least 48 hours prior to the first CHG shower.  You may shave your face/neck. Please follow these instructions carefully:  1.  Shower with CHG Soap the night before surgery and the  morning of Surgery.  2.  If you choose to wash your hair, wash your hair first as usual with your  normal  shampoo.  3.  After you shampoo, rinse your  hair and body thoroughly to remove the  shampoo.                           4.  Use CHG as you would any other liquid soap.  You can apply chg directly  to the skin and wash                       Gently with a scrungie or clean washcloth.  5.  Apply the CHG Soap to your body ONLY FROM THE NECK DOWN.   Do not use on face/ open                           Wound or open sores. Avoid contact with eyes, ears mouth and genitals (private parts).                       Wash face,  Genitals (private parts) with your normal soap.             6.  Wash thoroughly, paying special attention to the area where your surgery  will be performed.  7.  Thoroughly rinse  your body with warm water from the neck down.  8.  DO NOT shower/wash with your normal soap after using and rinsing off  the CHG Soap.                9.  Pat yourself dry with a clean towel.            10.  Wear clean pajamas.            11.  Place clean sheets on your bed the night of your first shower and do not  sleep with pets. Day of Surgery : Do not apply any lotions/deodorants the morning of surgery.  Please wear clean clothes to the hospital/surgery center.  FAILURE TO FOLLOW THESE INSTRUCTIONS MAY RESULT IN THE CANCELLATION OF YOUR SURGERY PATIENT SIGNATURE_________________________________  NURSE SIGNATURE__________________________________  ________________________________________________________________________

## 2024-03-09 NOTE — Progress Notes (Signed)
 Need orders in epic. Surgery on 03/10/24.  Thank You.

## 2024-03-09 NOTE — Anesthesia Preprocedure Evaluation (Addendum)
"                                    Anesthesia Evaluation  Patient identified by MRN, date of birth, ID band Patient awake    Reviewed: Allergy & Precautions, NPO status , Patient's Chart, lab work & pertinent test results  History of Anesthesia Complications (+) PONV and history of anesthetic complications  Airway Mallampati: II  TM Distance: >3 FB Neck ROM: Full    Dental no notable dental hx. (+) Teeth Intact, Dental Advisory Given   Pulmonary sleep apnea    Pulmonary exam normal breath sounds clear to auscultation       Cardiovascular hypertension, Pt. on medications Normal cardiovascular exam Rhythm:Regular Rate:Normal     Neuro/Psych  PSYCHIATRIC DISORDERS      negative neurological ROS     GI/Hepatic ,GERD  Medicated and Controlled,,  Endo/Other    Renal/GU Renal disease     Musculoskeletal  (+) Arthritis ,    Abdominal   Peds  Hematology   Anesthesia Other Findings All: Hydrocodone, Codeine, Prednisone   Reproductive/Obstetrics                              Anesthesia Physical Anesthesia Plan  ASA: 3  Anesthesia Plan: General   Post-op Pain Management: Ofirmev  IV (intra-op)*   Induction: Intravenous  PONV Risk Score and Plan: 4 or greater and Treatment may vary due to age or medical condition, Midazolam , Ondansetron , Dexamethasone , Propofol  infusion and TIVA  Airway Management Planned: LMA  Additional Equipment: None  Intra-op Plan:   Post-operative Plan: Extubation in OR  Informed Consent: I have reviewed the patients History and Physical, chart, labs and discussed the procedure including the risks, benefits and alternatives for the proposed anesthesia with the patient or authorized representative who has indicated his/her understanding and acceptance.     Dental advisory given  Plan Discussed with:   Anesthesia Plan Comments: (TIVA GA w LMA)         Anesthesia Quick Evaluation  "

## 2024-03-09 NOTE — Progress Notes (Addendum)
 Anesthesia Review:  PCP: Amy Bedsole LVO 09/17/23  Cardiologist : none   PPM/ ICD: Device Orders: Rep Notified:  Chest x-ray : EKG : Echo : 2019  CT cors- 2021  Stress test: 2019  Cardiac Cath :   Activity level:  can do a flight of stairs without difficulty  Sleep Study/ CPAP : none  Fasting Blood Sugar :      / Checks Blood Sugar -- times a day:    Blood Thinner/ Instructions /Last Dose: ASA / Instructions/ Last Dose :    03/09/24- med hx and preop instructions compelted via phone on 03/09/24.  At End of phone call pt asked to call ADmitting at [phone] .  PT voiced understanding.    Message routed to DR Norleen Seltzer and requested orders o 03/09/24.

## 2024-03-10 ENCOUNTER — Ambulatory Visit (HOSPITAL_COMMUNITY)
Admission: RE | Admit: 2024-03-10 | Discharge: 2024-03-10 | Disposition: A | Discharge status: Home / Self Care | Source: Ambulatory Visit | Attending: Urology | Admitting: Urology

## 2024-03-10 ENCOUNTER — Encounter (HOSPITAL_COMMUNITY)
Admission: RE | Disposition: A | Discharge status: Home / Self Care | Payer: Self-pay | Source: Ambulatory Visit | Attending: Urology

## 2024-03-10 ENCOUNTER — Ambulatory Visit (HOSPITAL_COMMUNITY)

## 2024-03-10 ENCOUNTER — Encounter (HOSPITAL_COMMUNITY): Payer: Self-pay | Admitting: Urology

## 2024-03-10 ENCOUNTER — Ambulatory Visit (HOSPITAL_COMMUNITY): Admitting: Anesthesiology

## 2024-03-10 DIAGNOSIS — I10 Essential (primary) hypertension: Secondary | ICD-10-CM

## 2024-03-10 DIAGNOSIS — N202 Calculus of kidney with calculus of ureter: Secondary | ICD-10-CM | POA: Diagnosis present

## 2024-03-10 DIAGNOSIS — G4730 Sleep apnea, unspecified: Secondary | ICD-10-CM | POA: Insufficient documentation

## 2024-03-10 DIAGNOSIS — E7800 Pure hypercholesterolemia, unspecified: Secondary | ICD-10-CM | POA: Diagnosis not present

## 2024-03-10 DIAGNOSIS — Z01818 Encounter for other preprocedural examination: Secondary | ICD-10-CM

## 2024-03-10 HISTORY — PX: URETEROSCOPY WITH HOLMIUM LASER LITHOTRIPSY: SHX6645

## 2024-03-10 LAB — BASIC METABOLIC PANEL WITH GFR
Anion gap: 12 (ref 5–15)
BUN: 27 mg/dL — ABNORMAL HIGH (ref 8–23)
CO2: 25 mmol/L (ref 22–32)
Calcium: 9 mg/dL (ref 8.9–10.3)
Chloride: 102 mmol/L (ref 98–111)
Creatinine, Ser: 1.6 mg/dL — ABNORMAL HIGH (ref 0.61–1.24)
GFR, Estimated: 46 mL/min — ABNORMAL LOW
Glucose, Bld: 139 mg/dL — ABNORMAL HIGH (ref 70–99)
Potassium: 4 mmol/L (ref 3.5–5.1)
Sodium: 140 mmol/L (ref 135–145)

## 2024-03-10 LAB — CBC
HCT: 43.2 % (ref 39.0–52.0)
HCT: 40.3 % (ref 39.0–52.0)
Hemoglobin: 14.5 g/dL (ref 13.0–17.0)
Hemoglobin: 13.7 g/dL (ref 13.0–17.0)
MCH: 30.9 pg (ref 26.0–34.0)
MCH: 31.4 pg (ref 26.0–34.0)
MCHC: 33.6 g/dL (ref 30.0–36.0)
MCHC: 34 g/dL (ref 30.0–36.0)
MCV: 91.9 fL (ref 80.0–100.0)
MCV: 92.2 fL (ref 80.0–100.0)
Platelets: 218 K/uL (ref 150–400)
Platelets: 180 K/uL (ref 150–400)
RBC: 4.7 MIL/uL (ref 4.22–5.81)
RBC: 4.37 MIL/uL (ref 4.22–5.81)
RDW: 13.2 % (ref 11.5–15.5)
RDW: 13.2 % (ref 11.5–15.5)
WBC: 9.3 K/uL (ref 4.0–10.5)
WBC: 8.9 K/uL (ref 4.0–10.5)
nRBC: 0 % (ref 0.0–0.2)
nRBC: 0 % (ref 0.0–0.2)

## 2024-03-10 MED ORDER — BELLADONNA ALKALOIDS-OPIUM 16.2-60 MG RE SUPP: 1.0000 | Freq: Once | RECTAL | Status: DC

## 2024-03-10 MED ORDER — ONDANSETRON HCL 4 MG/2ML IJ SOLN: 4.0000 mg | Freq: Once | INTRAMUSCULAR | Status: DC | PRN

## 2024-03-10 MED ORDER — HYDROMORPHONE HCL 1 MG/ML IJ SOLN
0.2500 mg | INTRAMUSCULAR | Status: DC | PRN
  Administered 2024-03-10: 0.25 mg via INTRAVENOUS
  Administered 2024-03-10: 0.5 mg via INTRAVENOUS
  Administered 2024-03-10: 0.25 mg via INTRAVENOUS

## 2024-03-10 MED ORDER — SODIUM CHLORIDE 0.9% FLUSH: 3.0000 mL | Freq: Two times a day (BID) | INTRAVENOUS | Status: DC

## 2024-03-10 MED ORDER — HYDROMORPHONE HCL 1 MG/ML IJ SOLN
INTRAMUSCULAR | Status: AC
  Filled 2024-03-10: qty 1

## 2024-03-10 MED ORDER — IOHEXOL 300 MG/ML  SOLN
INTRAMUSCULAR | Status: DC | PRN
  Administered 2024-03-10: 10 mL

## 2024-03-10 MED ORDER — LACTATED RINGERS IV SOLN: INTRAVENOUS | Status: DC

## 2024-03-10 MED ORDER — OXYCODONE HCL 5 MG PO TABS: 5.0000 mg | ORAL_TABLET | ORAL | Status: DC | PRN

## 2024-03-10 MED ORDER — EPHEDRINE SULFATE (PRESSORS) 25 MG/5ML IV SOSY
PREFILLED_SYRINGE | INTRAVENOUS | Status: DC | PRN
  Administered 2024-03-10: 10 mg via INTRAVENOUS

## 2024-03-10 MED ORDER — MORPHINE SULFATE (PF) 2 MG/ML IV SOLN: 2.0000 mg | INTRAVENOUS | Status: DC | PRN

## 2024-03-10 MED ORDER — CEFAZOLIN SODIUM-DEXTROSE 2-4 GM/100ML-% IV SOLN
INTRAVENOUS | Status: AC
  Filled 2024-03-10: qty 100

## 2024-03-10 MED ORDER — OXYCODONE HCL 5 MG PO TABS
5.0000 mg | ORAL_TABLET | Freq: Once | ORAL | Status: AC | PRN
  Administered 2024-03-10: 5 mg via ORAL

## 2024-03-10 MED ORDER — SODIUM CHLORIDE 0.9% FLUSH: 3.0000 mL | INTRAVENOUS | Status: DC | PRN

## 2024-03-10 MED ORDER — ACETAMINOPHEN 10 MG/ML IV SOLN
1000.0000 mg | Freq: Once | INTRAVENOUS | Status: DC | PRN
  Administered 2024-03-10: 1000 mg via INTRAVENOUS

## 2024-03-10 MED ORDER — ACETAMINOPHEN 325 MG PO TABS: 650.0000 mg | ORAL_TABLET | ORAL | Status: DC | PRN

## 2024-03-10 MED ORDER — ACETAMINOPHEN 10 MG/ML IV SOLN
INTRAVENOUS | Status: AC
  Filled 2024-03-10: qty 100

## 2024-03-10 MED ORDER — TAMSULOSIN HCL 0.4 MG PO CAPS: 0.4000 mg | ORAL_CAPSULE | Freq: Every day | ORAL | 1 refills | Status: AC

## 2024-03-10 MED ORDER — OXYCODONE HCL 5 MG PO TABS
ORAL_TABLET | ORAL | Status: AC
  Filled 2024-03-10: qty 1

## 2024-03-10 MED ORDER — ONDANSETRON HCL 4 MG/2ML IJ SOLN
INTRAMUSCULAR | Status: DC | PRN
  Administered 2024-03-10: 4 mg via INTRAVENOUS

## 2024-03-10 MED ORDER — ACETAMINOPHEN 650 MG RE SUPP: 650.0000 mg | RECTAL | Status: DC | PRN

## 2024-03-10 MED ORDER — CHLORHEXIDINE GLUCONATE 0.12 % MT SOLN
15.0000 mL | Freq: Once | OROMUCOSAL | Status: AC
  Administered 2024-03-10: 15 mL via OROMUCOSAL

## 2024-03-10 MED ORDER — PROPOFOL 10 MG/ML IV BOLUS
INTRAVENOUS | Status: DC | PRN
  Administered 2024-03-10: 150 mg via INTRAVENOUS

## 2024-03-10 MED ORDER — SODIUM CHLORIDE 0.9 % IR SOLN
Status: DC | PRN
  Administered 2024-03-10: 6000 mL via INTRAVESICAL

## 2024-03-10 MED ORDER — PHENYLEPHRINE 80 MCG/ML (10ML) SYRINGE FOR IV PUSH (FOR BLOOD PRESSURE SUPPORT)
PREFILLED_SYRINGE | INTRAVENOUS | Status: DC | PRN
  Administered 2024-03-10 (×2): 160 ug via INTRAVENOUS
  Administered 2024-03-10 (×2): 240 ug via INTRAVENOUS

## 2024-03-10 MED ORDER — ORAL CARE MOUTH RINSE: 15.0000 mL | Freq: Once | OROMUCOSAL | Status: AC

## 2024-03-10 MED ORDER — MIDAZOLAM HCL 5 MG/5ML IJ SOLN
INTRAMUSCULAR | Status: DC | PRN
  Administered 2024-03-10: 2 mg via INTRAVENOUS

## 2024-03-10 MED ORDER — LIDOCAINE HCL (PF) 2 % IJ SOLN
INTRAMUSCULAR | Status: DC | PRN
  Administered 2024-03-10: 100 mg via INTRADERMAL

## 2024-03-10 MED ORDER — SODIUM CHLORIDE 0.9 % IV SOLN: 250.0000 mL | INTRAVENOUS | Status: DC | PRN

## 2024-03-10 MED ORDER — PHENYLEPHRINE HCL-NACL 20-0.9 MG/250ML-% IV SOLN
INTRAVENOUS | Status: DC | PRN
  Administered 2024-03-10: 50 ug/min via INTRAVENOUS

## 2024-03-10 MED ORDER — AMISULPRIDE (ANTIEMETIC) 5 MG/2ML IV SOLN: 10.0000 mg | Freq: Once | INTRAVENOUS | Status: DC | PRN

## 2024-03-10 MED ORDER — CEFAZOLIN SODIUM-DEXTROSE 2-4 GM/100ML-% IV SOLN
2.0000 g | INTRAVENOUS | Status: AC
  Administered 2024-03-10: 2 g via INTRAVENOUS

## 2024-03-10 MED ORDER — OXYCODONE HCL 5 MG/5ML PO SOLN: 5.0000 mg | Freq: Once | ORAL | Status: AC | PRN

## 2024-03-10 MED ORDER — FENTANYL CITRATE (PF) 100 MCG/2ML IJ SOLN
INTRAMUSCULAR | Status: DC | PRN
  Administered 2024-03-10: 25 ug via INTRAVENOUS

## 2024-03-10 MED ORDER — OXYBUTYNIN CHLORIDE 5 MG PO TABS: 5.0000 mg | ORAL_TABLET | Freq: Three times a day (TID) | ORAL | 1 refills | Status: AC | PRN

## 2024-03-10 NOTE — Op Note (Signed)
 Procedure: 1.  Cystoscopy with left retrograde pyelogram and interpretation. 2.  Left ureteroscopic stone extraction with holmium laser application and insertion of left double-J stent. 3.  Application of fluoroscopy.  Pre-op diagnosis: Left proximal ureteral and renal stones.  Postop diagnosis: Same.  Surgeon: Dr. Norleen Seltzer.  Anesthesia: General.  Specimen: Stone fragments.  Drains: 4.8 French by 24 cm left double-J stent with tether.  EBL: None.  Complications: None.  Indications: The patient presented to the office yesterday with severe pain and nausea from 8 mm right proximal ureteral stone.  He was noted also to have a 17 mm upper pole stone, an additional 8 mm midpole stone and a 12 mm lower pole stone.  He has elected ureteroscopy for management.  Procedure: He was taken the operating room where general anesthetic was induced.  He was given Ancef .  He was placed in lithotomy position and food PSIs.  His perineum and genitalia were prepped with Betadine solution he was draped in usual sterile fashion.  Cystoscopy was performed using the 21 French scope and 30 degree lens.  Examination revealed a normal urethra.  The external sphincter was intact.  The prostatic urethra was approximately 2 to 3 cm in length with lateral lobe hyperplasia and a high bladder neck with evidence of radiation changes from his prostate cancer treatment.  The bladder wall was smooth and pale with mild trabeculation but no mucosal lesions.  The ureteral orifices were unremarkable.  The left ureteral orifice cannulated with a 5 French open-ended catheter and Omnipaque  was instilled.  The left retrograde pyelogram demonstrated a normal caliber ureter to a filling defect at about L3 consistent with a stone with some proximal dilation.  The renal stones were identified in the kidney as previously noted.  The lower pole stone had a separate long infundibulum.  A sensor wire was advanced to the kidney and passed  the stone without difficulty.  I then passed the inner core of a 12/14 French 28 cm digital access sheath up to the level of the stone.  This was followed by the assembled sheath.  The sheath was then removed and the semirigid ureteroscope was advanced alongside the wire.  An additional sensor wire was used as a filiform to aid passage and I could get to the level of the stone but could not get an angle on the stone to allow laser treatment.  The ureteroscope was removed after the wire to advance advanced to the kidney.  The 28 cm sheath was then advanced over the working wire and the inner core and wire were removed leaving the safety wire adjacent to the sheath.  The dual-lumen digital flexible ureteroscope was then advanced through the sheath and the stone was noted to have been pushed back into the renal pelvis.  This point I replaced the sensor wire and exchanged the 28 cm sheath for a 36 cm sheath.  The scope was then advanced back into the renal collecting system where the ureteral stone was now in the mid calyx along with the other smaller stone.  These were then fragmented using the 242 m holmium laser fiber using the laser on the dusting setting.  Both right and left pedals were used for fragmentation and the larger fragments were removed using an engage basket.  Once the mid pole stones had been fragmented and removed I turned my attention to the upper pole.  The stone was then fragmented with fragments being removed intermittently until eventually all that was  left were a few small fragments in the 2 to 3 mm range.  They become more bloody so it was difficult to see more to remove any other fragments but on fluoroscopy nothing large appeared to remain.  I did visualize the stone in the lower calyx but felt it needed to be dealt with at a separate procedure, possibly lithotripsy at a later date.  Ureteroscope was removed along with the sheath while inspecting the ureter.  There was some  inflammatory changes at the location of the prior stone impaction but no other significant ureteral injury.  The patient had expressed desire to avoid the stent but I felt with the residual stone volume and the inflammatory changes that a stent for a few days was worthwhile to consider so I selected a 4.8 French by 24 cm contour double-J stent with tether which hopefully with the smaller size will be less irritating for him.  The cystoscope was then reinserted over the wire and the stent was passed the kidney under fluoroscopic guidance.  The wire was removed, leaving good: Kidney and a good coil in the bladder.  The bladder was drained and the cystoscope was removed leaving the stent string exiting the urethra.  The final position was confirmed with fluoroscopy.  He was taken down from lithotomy position, his anesthetic was reversed and he was moved to recovery in stable condition.  There were no complications.  Some of the stone fragments were sent to the lab and the remainder was given to the patient's family.  The stent string was secured to his penis.  SABRA

## 2024-03-10 NOTE — Anesthesia Procedure Notes (Signed)
 Procedure Name: LMA Insertion Date/Time: 03/10/2024 9:58 AM  Performed by: Carleton Garnette SAUNDERS, CRNAPre-anesthesia Checklist: Patient identified, Emergency Drugs available, Suction available, Patient being monitored and Timeout performed Patient Re-evaluated:Patient Re-evaluated prior to induction Oxygen Delivery Method: Circle system utilized Preoxygenation: Pre-oxygenation with 100% oxygen Induction Type: IV induction LMA: LMA with gastric port inserted LMA Size: 4.0 Tube type: Oral Number of attempts: 1 Placement Confirmation: positive ETCO2 and breath sounds checked- equal and bilateral Tube secured with: Tape Dental Injury: Teeth and Oropharynx as per pre-operative assessment

## 2024-03-10 NOTE — Anesthesia Postprocedure Evaluation (Signed)
"   Anesthesia Post Note  Patient: Casey [name]  Procedure(s) Performed: URETEROSCOPY, WITH LITHOTRIPSY USING HOLMIUM LASER (Left)     Patient location during evaluation: PACU Anesthesia Type: General Level of consciousness: awake and alert Pain management: pain level controlled Vital Signs Assessment: post-procedure vital signs reviewed and stable Respiratory status: spontaneous breathing, nonlabored ventilation, respiratory function stable and patient connected to nasal cannula oxygen Cardiovascular status: blood pressure returned to baseline and stable Postop Assessment: no apparent nausea or vomiting Anesthetic complications: no   No notable events documented.  Last Vitals:  Vitals:   03/10/24 1315 03/10/24 1330  BP: (!) 142/78 137/75  Pulse: 80 77  Resp: 12   Temp:    SpO2: 92% 93%    Last Pain:  Vitals:   03/10/24 1333  PainSc: 4                  [name] A Sampson Self      "

## 2024-03-10 NOTE — Interval H&P Note (Signed)
 History and Physical Interval Note:  He has nausea this morning.   03/10/2024 9:33 AM  Casey [name]  has presented today for surgery, with the diagnosis of LEFT KIDNEY STONE.  The various methods of treatment have been discussed with the patient and family. After consideration of risks, benefits and other options for treatment, the patient has consented to  Procedures: URETEROSCOPY, WITH LITHOTRIPSY USING HOLMIUM LASER (Left) as a surgical intervention.  The patient's history has been reviewed, patient examined, no change in status, stable for surgery.  I have reviewed the patient's chart and labs.  Questions were answered to the patient's satisfaction.     Matalyn Nawaz

## 2024-03-10 NOTE — Transfer of Care (Signed)
 Immediate Anesthesia Transfer of Care Note  Patient: Casey [name]  Procedure(s) Performed: URETEROSCOPY, WITH LITHOTRIPSY USING HOLMIUM LASER (Left)  Patient Location: PACU  Anesthesia Type:General  Level of Consciousness: sedated  Airway & Oxygen Therapy: Patient Spontanous Breathing and Patient connected to face mask oxygen  Post-op Assessment: Report given to RN and Post -op Vital signs reviewed and stable  Post vital signs: Reviewed and stable  Last Vitals:  Vitals Value Taken Time  BP    Temp    Pulse    Resp    SpO2      Last Pain:  Vitals:   03/10/24 0935  PainSc: 8          Complications: No notable events documented.

## 2024-03-10 NOTE — Discharge Instructions (Addendum)
 You may remove the stent on Monday 03/13/24 by pulling the attached string.

## 2024-03-11 ENCOUNTER — Encounter (HOSPITAL_COMMUNITY): Payer: Self-pay | Admitting: Urology

## 2024-03-20 LAB — STONE ANALYSIS
Calcium Oxalate Dihydrate: 20 %
Calcium Oxalate Monohydrate: 75 %
Calcium Phosphate (Hydroxyl): 5 %
Weight Calculi: 106 mg

## 2024-04-04 ENCOUNTER — Other Ambulatory Visit: Payer: Self-pay | Admitting: Urology

## 2024-05-12 ENCOUNTER — Ambulatory Visit (HOSPITAL_COMMUNITY): Admit: 2024-05-12 | Admitting: Urology
